# Patient Record
Sex: Male | Born: 1945
Health system: Southern US, Community
[De-identification: ages and names within clinical notes are randomized; demographics above are authoritative.]

## PROBLEM LIST (undated history)

## (undated) DIAGNOSIS — K219 Gastro-esophageal reflux disease without esophagitis: Secondary | ICD-10-CM

## (undated) DIAGNOSIS — I10 Essential (primary) hypertension: Secondary | ICD-10-CM

## (undated) DIAGNOSIS — Z8619 Personal history of other infectious and parasitic diseases: Secondary | ICD-10-CM

## (undated) DIAGNOSIS — M109 Gout, unspecified: Secondary | ICD-10-CM

## (undated) DIAGNOSIS — E785 Hyperlipidemia, unspecified: Secondary | ICD-10-CM

## (undated) DIAGNOSIS — E119 Type 2 diabetes mellitus without complications: Secondary | ICD-10-CM

## (undated) DIAGNOSIS — H269 Unspecified cataract: Secondary | ICD-10-CM

## (undated) HISTORY — DX: Gastro-esophageal reflux disease without esophagitis: K21.9

## (undated) HISTORY — PX: TONSILLECTOMY: SUR1361

## (undated) HISTORY — DX: Essential (primary) hypertension: I10

## (undated) HISTORY — DX: Personal history of other infectious and parasitic diseases: Z86.19

## (undated) HISTORY — DX: Unspecified cataract: H26.9

## (undated) HISTORY — DX: Gout, unspecified: M10.9

## (undated) HISTORY — DX: Type 2 diabetes mellitus without complications: E11.9

## (undated) HISTORY — DX: Hyperlipidemia, unspecified: E78.5

---

## 2006-03-24 ENCOUNTER — Ambulatory Visit: Payer: Self-pay | Admitting: General Surgery

## 2007-02-01 DIAGNOSIS — I10 Essential (primary) hypertension: Secondary | ICD-10-CM | POA: Insufficient documentation

## 2007-05-24 HISTORY — PX: COLONOSCOPY: SHX174

## 2007-05-24 HISTORY — PX: UPPER GI ENDOSCOPY: SHX6162

## 2008-02-04 DIAGNOSIS — E1169 Type 2 diabetes mellitus with other specified complication: Secondary | ICD-10-CM | POA: Insufficient documentation

## 2008-02-26 ENCOUNTER — Ambulatory Visit: Payer: Self-pay | Admitting: General Surgery

## 2008-02-26 LAB — HM COLONOSCOPY

## 2011-03-04 ENCOUNTER — Ambulatory Visit: Payer: Self-pay | Admitting: General Surgery

## 2011-03-04 HISTORY — PX: UPPER GI ENDOSCOPY: SHX6162

## 2012-01-13 DIAGNOSIS — D485 Neoplasm of uncertain behavior of skin: Secondary | ICD-10-CM | POA: Diagnosis not present

## 2012-01-13 DIAGNOSIS — L820 Inflamed seborrheic keratosis: Secondary | ICD-10-CM | POA: Diagnosis not present

## 2012-01-13 DIAGNOSIS — L821 Other seborrheic keratosis: Secondary | ICD-10-CM | POA: Diagnosis not present

## 2012-02-14 DIAGNOSIS — J069 Acute upper respiratory infection, unspecified: Secondary | ICD-10-CM | POA: Diagnosis not present

## 2012-02-14 DIAGNOSIS — H903 Sensorineural hearing loss, bilateral: Secondary | ICD-10-CM | POA: Diagnosis not present

## 2012-02-14 DIAGNOSIS — J301 Allergic rhinitis due to pollen: Secondary | ICD-10-CM | POA: Diagnosis not present

## 2012-02-21 DIAGNOSIS — Z0000 Encounter for general adult medical examination without abnormal findings: Secondary | ICD-10-CM | POA: Diagnosis not present

## 2012-02-21 DIAGNOSIS — Z125 Encounter for screening for malignant neoplasm of prostate: Secondary | ICD-10-CM | POA: Diagnosis not present

## 2012-02-21 DIAGNOSIS — I10 Essential (primary) hypertension: Secondary | ICD-10-CM | POA: Diagnosis not present

## 2012-02-21 DIAGNOSIS — Z1159 Encounter for screening for other viral diseases: Secondary | ICD-10-CM | POA: Diagnosis not present

## 2012-02-21 DIAGNOSIS — K219 Gastro-esophageal reflux disease without esophagitis: Secondary | ICD-10-CM | POA: Diagnosis not present

## 2012-02-21 DIAGNOSIS — R7309 Other abnormal glucose: Secondary | ICD-10-CM | POA: Diagnosis not present

## 2012-02-22 DIAGNOSIS — H908 Mixed conductive and sensorineural hearing loss, unspecified: Secondary | ICD-10-CM | POA: Diagnosis not present

## 2012-02-22 DIAGNOSIS — H8090 Unspecified otosclerosis, unspecified ear: Secondary | ICD-10-CM | POA: Diagnosis not present

## 2012-02-23 DIAGNOSIS — H9319 Tinnitus, unspecified ear: Secondary | ICD-10-CM | POA: Diagnosis not present

## 2012-02-23 DIAGNOSIS — H908 Mixed conductive and sensorineural hearing loss, unspecified: Secondary | ICD-10-CM | POA: Diagnosis not present

## 2012-03-08 DIAGNOSIS — R972 Elevated prostate specific antigen [PSA]: Secondary | ICD-10-CM | POA: Diagnosis not present

## 2012-03-08 DIAGNOSIS — H8090 Unspecified otosclerosis, unspecified ear: Secondary | ICD-10-CM | POA: Diagnosis not present

## 2012-03-08 DIAGNOSIS — H908 Mixed conductive and sensorineural hearing loss, unspecified: Secondary | ICD-10-CM | POA: Diagnosis not present

## 2012-03-13 ENCOUNTER — Ambulatory Visit: Payer: Self-pay | Admitting: Unknown Physician Specialty

## 2012-03-20 ENCOUNTER — Ambulatory Visit: Payer: Self-pay | Admitting: Unknown Physician Specialty

## 2012-03-20 DIAGNOSIS — Z7982 Long term (current) use of aspirin: Secondary | ICD-10-CM | POA: Diagnosis not present

## 2012-03-20 DIAGNOSIS — M109 Gout, unspecified: Secondary | ICD-10-CM | POA: Diagnosis not present

## 2012-03-20 DIAGNOSIS — H908 Mixed conductive and sensorineural hearing loss, unspecified: Secondary | ICD-10-CM | POA: Diagnosis not present

## 2012-03-20 DIAGNOSIS — Z79899 Other long term (current) drug therapy: Secondary | ICD-10-CM | POA: Diagnosis not present

## 2012-03-20 DIAGNOSIS — R972 Elevated prostate specific antigen [PSA]: Secondary | ICD-10-CM | POA: Diagnosis not present

## 2012-03-20 DIAGNOSIS — I10 Essential (primary) hypertension: Secondary | ICD-10-CM | POA: Diagnosis not present

## 2012-03-20 DIAGNOSIS — H902 Conductive hearing loss, unspecified: Secondary | ICD-10-CM | POA: Diagnosis not present

## 2012-03-20 DIAGNOSIS — H8090 Unspecified otosclerosis, unspecified ear: Secondary | ICD-10-CM | POA: Diagnosis not present

## 2012-03-20 DIAGNOSIS — H7419 Adhesive middle ear disease, unspecified ear: Secondary | ICD-10-CM | POA: Diagnosis not present

## 2012-03-20 DIAGNOSIS — K219 Gastro-esophageal reflux disease without esophagitis: Secondary | ICD-10-CM | POA: Diagnosis not present

## 2012-03-20 DIAGNOSIS — Z87891 Personal history of nicotine dependence: Secondary | ICD-10-CM | POA: Diagnosis not present

## 2012-03-20 DIAGNOSIS — K449 Diaphragmatic hernia without obstruction or gangrene: Secondary | ICD-10-CM | POA: Diagnosis not present

## 2012-03-20 DIAGNOSIS — Z5309 Procedure and treatment not carried out because of other contraindication: Secondary | ICD-10-CM | POA: Diagnosis not present

## 2012-03-20 DIAGNOSIS — Q782 Osteopetrosis: Secondary | ICD-10-CM | POA: Diagnosis not present

## 2012-03-20 DIAGNOSIS — N419 Inflammatory disease of prostate, unspecified: Secondary | ICD-10-CM | POA: Diagnosis not present

## 2012-03-27 DIAGNOSIS — Z0000 Encounter for general adult medical examination without abnormal findings: Secondary | ICD-10-CM | POA: Diagnosis not present

## 2012-03-27 DIAGNOSIS — Z23 Encounter for immunization: Secondary | ICD-10-CM | POA: Diagnosis not present

## 2012-03-27 DIAGNOSIS — K219 Gastro-esophageal reflux disease without esophagitis: Secondary | ICD-10-CM | POA: Diagnosis not present

## 2012-03-27 DIAGNOSIS — H908 Mixed conductive and sensorineural hearing loss, unspecified: Secondary | ICD-10-CM | POA: Diagnosis not present

## 2012-05-01 DIAGNOSIS — R972 Elevated prostate specific antigen [PSA]: Secondary | ICD-10-CM | POA: Diagnosis not present

## 2012-06-04 DIAGNOSIS — N411 Chronic prostatitis: Secondary | ICD-10-CM | POA: Insufficient documentation

## 2012-06-04 DIAGNOSIS — R972 Elevated prostate specific antigen [PSA]: Secondary | ICD-10-CM | POA: Diagnosis not present

## 2012-06-04 DIAGNOSIS — N403 Nodular prostate with lower urinary tract symptoms: Secondary | ICD-10-CM | POA: Diagnosis not present

## 2012-06-04 DIAGNOSIS — R339 Retention of urine, unspecified: Secondary | ICD-10-CM | POA: Diagnosis not present

## 2012-06-07 DIAGNOSIS — H35039 Hypertensive retinopathy, unspecified eye: Secondary | ICD-10-CM | POA: Diagnosis not present

## 2012-06-18 DIAGNOSIS — R972 Elevated prostate specific antigen [PSA]: Secondary | ICD-10-CM | POA: Diagnosis not present

## 2012-06-21 DIAGNOSIS — J019 Acute sinusitis, unspecified: Secondary | ICD-10-CM | POA: Diagnosis not present

## 2012-06-21 DIAGNOSIS — Z125 Encounter for screening for malignant neoplasm of prostate: Secondary | ICD-10-CM | POA: Diagnosis not present

## 2012-06-21 DIAGNOSIS — R7309 Other abnormal glucose: Secondary | ICD-10-CM | POA: Diagnosis not present

## 2012-06-21 DIAGNOSIS — K219 Gastro-esophageal reflux disease without esophagitis: Secondary | ICD-10-CM | POA: Diagnosis not present

## 2012-06-27 DIAGNOSIS — Z125 Encounter for screening for malignant neoplasm of prostate: Secondary | ICD-10-CM | POA: Diagnosis not present

## 2012-06-27 DIAGNOSIS — K219 Gastro-esophageal reflux disease without esophagitis: Secondary | ICD-10-CM | POA: Diagnosis not present

## 2012-06-27 DIAGNOSIS — J019 Acute sinusitis, unspecified: Secondary | ICD-10-CM | POA: Diagnosis not present

## 2012-06-27 DIAGNOSIS — R7309 Other abnormal glucose: Secondary | ICD-10-CM | POA: Diagnosis not present

## 2012-08-29 DIAGNOSIS — R972 Elevated prostate specific antigen [PSA]: Secondary | ICD-10-CM | POA: Diagnosis not present

## 2012-09-04 DIAGNOSIS — R972 Elevated prostate specific antigen [PSA]: Secondary | ICD-10-CM | POA: Diagnosis not present

## 2012-09-04 DIAGNOSIS — N401 Enlarged prostate with lower urinary tract symptoms: Secondary | ICD-10-CM | POA: Diagnosis not present

## 2012-09-04 DIAGNOSIS — N138 Other obstructive and reflux uropathy: Secondary | ICD-10-CM | POA: Insufficient documentation

## 2012-11-06 DIAGNOSIS — R972 Elevated prostate specific antigen [PSA]: Secondary | ICD-10-CM | POA: Diagnosis not present

## 2012-11-19 DIAGNOSIS — N411 Chronic prostatitis: Secondary | ICD-10-CM | POA: Diagnosis not present

## 2012-11-19 DIAGNOSIS — R972 Elevated prostate specific antigen [PSA]: Secondary | ICD-10-CM | POA: Diagnosis not present

## 2012-11-19 DIAGNOSIS — N401 Enlarged prostate with lower urinary tract symptoms: Secondary | ICD-10-CM | POA: Diagnosis not present

## 2012-12-06 ENCOUNTER — Encounter: Payer: Self-pay | Admitting: *Deleted

## 2013-01-18 DIAGNOSIS — L821 Other seborrheic keratosis: Secondary | ICD-10-CM | POA: Diagnosis not present

## 2013-01-18 DIAGNOSIS — D235 Other benign neoplasm of skin of trunk: Secondary | ICD-10-CM | POA: Diagnosis not present

## 2013-02-25 DIAGNOSIS — N410 Acute prostatitis: Secondary | ICD-10-CM | POA: Diagnosis not present

## 2013-02-25 DIAGNOSIS — R972 Elevated prostate specific antigen [PSA]: Secondary | ICD-10-CM | POA: Diagnosis not present

## 2013-02-25 DIAGNOSIS — R7309 Other abnormal glucose: Secondary | ICD-10-CM | POA: Diagnosis not present

## 2013-02-25 DIAGNOSIS — Z0000 Encounter for general adult medical examination without abnormal findings: Secondary | ICD-10-CM | POA: Diagnosis not present

## 2013-02-25 DIAGNOSIS — Z23 Encounter for immunization: Secondary | ICD-10-CM | POA: Diagnosis not present

## 2013-02-25 DIAGNOSIS — K219 Gastro-esophageal reflux disease without esophagitis: Secondary | ICD-10-CM | POA: Diagnosis not present

## 2013-02-25 DIAGNOSIS — I10 Essential (primary) hypertension: Secondary | ICD-10-CM | POA: Diagnosis not present

## 2013-04-16 DIAGNOSIS — M546 Pain in thoracic spine: Secondary | ICD-10-CM | POA: Diagnosis not present

## 2013-04-16 DIAGNOSIS — M542 Cervicalgia: Secondary | ICD-10-CM | POA: Diagnosis not present

## 2013-04-16 DIAGNOSIS — M9981 Other biomechanical lesions of cervical region: Secondary | ICD-10-CM | POA: Diagnosis not present

## 2013-04-16 DIAGNOSIS — M545 Low back pain: Secondary | ICD-10-CM | POA: Diagnosis not present

## 2013-04-16 DIAGNOSIS — M999 Biomechanical lesion, unspecified: Secondary | ICD-10-CM | POA: Diagnosis not present

## 2013-05-13 DIAGNOSIS — R972 Elevated prostate specific antigen [PSA]: Secondary | ICD-10-CM | POA: Diagnosis not present

## 2013-05-13 DIAGNOSIS — N411 Chronic prostatitis: Secondary | ICD-10-CM | POA: Diagnosis not present

## 2013-05-13 DIAGNOSIS — N401 Enlarged prostate with lower urinary tract symptoms: Secondary | ICD-10-CM | POA: Diagnosis not present

## 2013-05-21 DIAGNOSIS — M542 Cervicalgia: Secondary | ICD-10-CM | POA: Diagnosis not present

## 2013-05-21 DIAGNOSIS — M999 Biomechanical lesion, unspecified: Secondary | ICD-10-CM | POA: Diagnosis not present

## 2013-05-21 DIAGNOSIS — M546 Pain in thoracic spine: Secondary | ICD-10-CM | POA: Diagnosis not present

## 2013-05-21 DIAGNOSIS — M9981 Other biomechanical lesions of cervical region: Secondary | ICD-10-CM | POA: Diagnosis not present

## 2013-05-21 DIAGNOSIS — M545 Low back pain: Secondary | ICD-10-CM | POA: Diagnosis not present

## 2013-06-03 DIAGNOSIS — E119 Type 2 diabetes mellitus without complications: Secondary | ICD-10-CM | POA: Diagnosis not present

## 2013-06-03 DIAGNOSIS — E785 Hyperlipidemia, unspecified: Secondary | ICD-10-CM | POA: Diagnosis not present

## 2013-06-03 DIAGNOSIS — R109 Unspecified abdominal pain: Secondary | ICD-10-CM | POA: Diagnosis not present

## 2013-06-03 DIAGNOSIS — Z23 Encounter for immunization: Secondary | ICD-10-CM | POA: Diagnosis not present

## 2013-06-03 DIAGNOSIS — Z0000 Encounter for general adult medical examination without abnormal findings: Secondary | ICD-10-CM | POA: Diagnosis not present

## 2013-06-03 DIAGNOSIS — K219 Gastro-esophageal reflux disease without esophagitis: Secondary | ICD-10-CM | POA: Diagnosis not present

## 2013-06-13 DIAGNOSIS — H35039 Hypertensive retinopathy, unspecified eye: Secondary | ICD-10-CM | POA: Diagnosis not present

## 2013-06-28 DIAGNOSIS — M546 Pain in thoracic spine: Secondary | ICD-10-CM | POA: Diagnosis not present

## 2013-06-28 DIAGNOSIS — M545 Low back pain, unspecified: Secondary | ICD-10-CM | POA: Diagnosis not present

## 2013-06-28 DIAGNOSIS — M9981 Other biomechanical lesions of cervical region: Secondary | ICD-10-CM | POA: Diagnosis not present

## 2013-06-28 DIAGNOSIS — M542 Cervicalgia: Secondary | ICD-10-CM | POA: Diagnosis not present

## 2013-06-28 DIAGNOSIS — M999 Biomechanical lesion, unspecified: Secondary | ICD-10-CM | POA: Diagnosis not present

## 2013-07-30 DIAGNOSIS — M546 Pain in thoracic spine: Secondary | ICD-10-CM | POA: Diagnosis not present

## 2013-07-30 DIAGNOSIS — M999 Biomechanical lesion, unspecified: Secondary | ICD-10-CM | POA: Diagnosis not present

## 2013-07-30 DIAGNOSIS — M545 Low back pain, unspecified: Secondary | ICD-10-CM | POA: Diagnosis not present

## 2013-07-30 DIAGNOSIS — M9981 Other biomechanical lesions of cervical region: Secondary | ICD-10-CM | POA: Diagnosis not present

## 2013-07-30 DIAGNOSIS — M542 Cervicalgia: Secondary | ICD-10-CM | POA: Diagnosis not present

## 2013-09-03 DIAGNOSIS — M545 Low back pain, unspecified: Secondary | ICD-10-CM | POA: Diagnosis not present

## 2013-09-03 DIAGNOSIS — M999 Biomechanical lesion, unspecified: Secondary | ICD-10-CM | POA: Diagnosis not present

## 2013-09-03 DIAGNOSIS — M9981 Other biomechanical lesions of cervical region: Secondary | ICD-10-CM | POA: Diagnosis not present

## 2013-09-03 DIAGNOSIS — M546 Pain in thoracic spine: Secondary | ICD-10-CM | POA: Diagnosis not present

## 2013-09-03 DIAGNOSIS — M542 Cervicalgia: Secondary | ICD-10-CM | POA: Diagnosis not present

## 2013-10-08 DIAGNOSIS — M9981 Other biomechanical lesions of cervical region: Secondary | ICD-10-CM | POA: Diagnosis not present

## 2013-10-08 DIAGNOSIS — M545 Low back pain, unspecified: Secondary | ICD-10-CM | POA: Diagnosis not present

## 2013-10-08 DIAGNOSIS — M542 Cervicalgia: Secondary | ICD-10-CM | POA: Diagnosis not present

## 2013-10-08 DIAGNOSIS — M999 Biomechanical lesion, unspecified: Secondary | ICD-10-CM | POA: Diagnosis not present

## 2013-10-08 DIAGNOSIS — M546 Pain in thoracic spine: Secondary | ICD-10-CM | POA: Diagnosis not present

## 2013-10-29 DIAGNOSIS — M9981 Other biomechanical lesions of cervical region: Secondary | ICD-10-CM | POA: Diagnosis not present

## 2013-10-29 DIAGNOSIS — M545 Low back pain, unspecified: Secondary | ICD-10-CM | POA: Diagnosis not present

## 2013-10-29 DIAGNOSIS — M542 Cervicalgia: Secondary | ICD-10-CM | POA: Diagnosis not present

## 2013-10-29 DIAGNOSIS — M999 Biomechanical lesion, unspecified: Secondary | ICD-10-CM | POA: Diagnosis not present

## 2013-10-29 DIAGNOSIS — M546 Pain in thoracic spine: Secondary | ICD-10-CM | POA: Diagnosis not present

## 2013-11-05 DIAGNOSIS — R972 Elevated prostate specific antigen [PSA]: Secondary | ICD-10-CM | POA: Diagnosis not present

## 2013-11-11 DIAGNOSIS — N401 Enlarged prostate with lower urinary tract symptoms: Secondary | ICD-10-CM | POA: Diagnosis not present

## 2013-11-11 DIAGNOSIS — R972 Elevated prostate specific antigen [PSA]: Secondary | ICD-10-CM | POA: Diagnosis not present

## 2013-11-11 DIAGNOSIS — N411 Chronic prostatitis: Secondary | ICD-10-CM | POA: Diagnosis not present

## 2013-11-14 DIAGNOSIS — Z23 Encounter for immunization: Secondary | ICD-10-CM | POA: Diagnosis not present

## 2013-11-14 DIAGNOSIS — E119 Type 2 diabetes mellitus without complications: Secondary | ICD-10-CM | POA: Diagnosis not present

## 2013-11-14 DIAGNOSIS — K219 Gastro-esophageal reflux disease without esophagitis: Secondary | ICD-10-CM | POA: Diagnosis not present

## 2013-11-14 DIAGNOSIS — I10 Essential (primary) hypertension: Secondary | ICD-10-CM | POA: Diagnosis not present

## 2013-11-14 DIAGNOSIS — E785 Hyperlipidemia, unspecified: Secondary | ICD-10-CM | POA: Diagnosis not present

## 2013-12-17 DIAGNOSIS — M542 Cervicalgia: Secondary | ICD-10-CM | POA: Diagnosis not present

## 2013-12-17 DIAGNOSIS — M545 Low back pain, unspecified: Secondary | ICD-10-CM | POA: Diagnosis not present

## 2013-12-17 DIAGNOSIS — M9981 Other biomechanical lesions of cervical region: Secondary | ICD-10-CM | POA: Diagnosis not present

## 2013-12-17 DIAGNOSIS — M546 Pain in thoracic spine: Secondary | ICD-10-CM | POA: Diagnosis not present

## 2013-12-17 DIAGNOSIS — M999 Biomechanical lesion, unspecified: Secondary | ICD-10-CM | POA: Diagnosis not present

## 2014-01-17 DIAGNOSIS — D1801 Hemangioma of skin and subcutaneous tissue: Secondary | ICD-10-CM | POA: Diagnosis not present

## 2014-01-17 DIAGNOSIS — D235 Other benign neoplasm of skin of trunk: Secondary | ICD-10-CM | POA: Diagnosis not present

## 2014-01-17 DIAGNOSIS — L821 Other seborrheic keratosis: Secondary | ICD-10-CM | POA: Diagnosis not present

## 2014-01-21 DIAGNOSIS — M546 Pain in thoracic spine: Secondary | ICD-10-CM | POA: Diagnosis not present

## 2014-01-21 DIAGNOSIS — M545 Low back pain, unspecified: Secondary | ICD-10-CM | POA: Diagnosis not present

## 2014-01-21 DIAGNOSIS — M9981 Other biomechanical lesions of cervical region: Secondary | ICD-10-CM | POA: Diagnosis not present

## 2014-01-21 DIAGNOSIS — M542 Cervicalgia: Secondary | ICD-10-CM | POA: Diagnosis not present

## 2014-01-21 DIAGNOSIS — M999 Biomechanical lesion, unspecified: Secondary | ICD-10-CM | POA: Diagnosis not present

## 2014-02-25 DIAGNOSIS — M9903 Segmental and somatic dysfunction of lumbar region: Secondary | ICD-10-CM | POA: Diagnosis not present

## 2014-02-25 DIAGNOSIS — M9902 Segmental and somatic dysfunction of thoracic region: Secondary | ICD-10-CM | POA: Diagnosis not present

## 2014-02-25 DIAGNOSIS — M542 Cervicalgia: Secondary | ICD-10-CM | POA: Diagnosis not present

## 2014-02-25 DIAGNOSIS — M545 Low back pain: Secondary | ICD-10-CM | POA: Diagnosis not present

## 2014-02-25 DIAGNOSIS — M9901 Segmental and somatic dysfunction of cervical region: Secondary | ICD-10-CM | POA: Diagnosis not present

## 2014-02-25 DIAGNOSIS — M546 Pain in thoracic spine: Secondary | ICD-10-CM | POA: Diagnosis not present

## 2014-02-26 DIAGNOSIS — Z1389 Encounter for screening for other disorder: Secondary | ICD-10-CM | POA: Diagnosis not present

## 2014-02-26 DIAGNOSIS — E119 Type 2 diabetes mellitus without complications: Secondary | ICD-10-CM | POA: Diagnosis not present

## 2014-02-26 DIAGNOSIS — Z23 Encounter for immunization: Secondary | ICD-10-CM | POA: Diagnosis not present

## 2014-02-26 DIAGNOSIS — K219 Gastro-esophageal reflux disease without esophagitis: Secondary | ICD-10-CM | POA: Diagnosis not present

## 2014-02-26 DIAGNOSIS — I10 Essential (primary) hypertension: Secondary | ICD-10-CM | POA: Diagnosis not present

## 2014-02-26 DIAGNOSIS — R972 Elevated prostate specific antigen [PSA]: Secondary | ICD-10-CM | POA: Diagnosis not present

## 2014-02-26 DIAGNOSIS — E785 Hyperlipidemia, unspecified: Secondary | ICD-10-CM | POA: Diagnosis not present

## 2014-02-26 DIAGNOSIS — Z0000 Encounter for general adult medical examination without abnormal findings: Secondary | ICD-10-CM | POA: Diagnosis not present

## 2014-02-26 LAB — CBC AND DIFFERENTIAL
HCT: 47 % (ref 41–53)
HEMOGLOBIN: 15.6 g/dL (ref 13.5–17.5)
PLATELETS: 219 10*3/uL (ref 150–399)
WBC: 7.4 10*3/mL

## 2014-02-26 LAB — BASIC METABOLIC PANEL
BUN: 15 mg/dL (ref 4–21)
Creatinine: 1 mg/dL (ref 0.6–1.3)
GLUCOSE: 128 mg/dL
POTASSIUM: 3.9 mmol/L (ref 3.4–5.3)
Sodium: 141 mmol/L (ref 137–147)

## 2014-02-26 LAB — HEPATIC FUNCTION PANEL
ALT: 16 U/L (ref 10–40)
AST: 17 U/L (ref 14–40)

## 2014-02-26 LAB — LIPID PANEL
CHOLESTEROL: 149 mg/dL (ref 0–200)
HDL: 61 mg/dL (ref 35–70)
LDL CALC: 74 mg/dL
Triglycerides: 72 mg/dL (ref 40–160)

## 2014-02-26 LAB — PSA: PSA: 4.2

## 2014-04-01 DIAGNOSIS — M9901 Segmental and somatic dysfunction of cervical region: Secondary | ICD-10-CM | POA: Diagnosis not present

## 2014-04-01 DIAGNOSIS — M9903 Segmental and somatic dysfunction of lumbar region: Secondary | ICD-10-CM | POA: Diagnosis not present

## 2014-04-01 DIAGNOSIS — M546 Pain in thoracic spine: Secondary | ICD-10-CM | POA: Diagnosis not present

## 2014-04-01 DIAGNOSIS — M542 Cervicalgia: Secondary | ICD-10-CM | POA: Diagnosis not present

## 2014-04-01 DIAGNOSIS — M545 Low back pain: Secondary | ICD-10-CM | POA: Diagnosis not present

## 2014-04-01 DIAGNOSIS — M9902 Segmental and somatic dysfunction of thoracic region: Secondary | ICD-10-CM | POA: Diagnosis not present

## 2014-04-10 DIAGNOSIS — N401 Enlarged prostate with lower urinary tract symptoms: Secondary | ICD-10-CM | POA: Diagnosis not present

## 2014-04-10 DIAGNOSIS — N411 Chronic prostatitis: Secondary | ICD-10-CM | POA: Diagnosis not present

## 2014-04-10 DIAGNOSIS — R339 Retention of urine, unspecified: Secondary | ICD-10-CM | POA: Diagnosis not present

## 2014-04-10 DIAGNOSIS — R972 Elevated prostate specific antigen [PSA]: Secondary | ICD-10-CM | POA: Diagnosis not present

## 2014-04-10 DIAGNOSIS — I10 Essential (primary) hypertension: Secondary | ICD-10-CM | POA: Diagnosis not present

## 2014-05-09 DIAGNOSIS — M9902 Segmental and somatic dysfunction of thoracic region: Secondary | ICD-10-CM | POA: Diagnosis not present

## 2014-05-09 DIAGNOSIS — M546 Pain in thoracic spine: Secondary | ICD-10-CM | POA: Diagnosis not present

## 2014-05-09 DIAGNOSIS — M545 Low back pain: Secondary | ICD-10-CM | POA: Diagnosis not present

## 2014-05-09 DIAGNOSIS — M9903 Segmental and somatic dysfunction of lumbar region: Secondary | ICD-10-CM | POA: Diagnosis not present

## 2014-05-09 DIAGNOSIS — M542 Cervicalgia: Secondary | ICD-10-CM | POA: Diagnosis not present

## 2014-05-09 DIAGNOSIS — M9901 Segmental and somatic dysfunction of cervical region: Secondary | ICD-10-CM | POA: Diagnosis not present

## 2014-05-26 LAB — HM DIABETES EYE EXAM

## 2014-06-19 DIAGNOSIS — H2513 Age-related nuclear cataract, bilateral: Secondary | ICD-10-CM | POA: Diagnosis not present

## 2014-06-19 DIAGNOSIS — H35033 Hypertensive retinopathy, bilateral: Secondary | ICD-10-CM | POA: Diagnosis not present

## 2014-06-20 DIAGNOSIS — M9902 Segmental and somatic dysfunction of thoracic region: Secondary | ICD-10-CM | POA: Diagnosis not present

## 2014-06-20 DIAGNOSIS — M546 Pain in thoracic spine: Secondary | ICD-10-CM | POA: Diagnosis not present

## 2014-06-20 DIAGNOSIS — M542 Cervicalgia: Secondary | ICD-10-CM | POA: Diagnosis not present

## 2014-06-20 DIAGNOSIS — M9903 Segmental and somatic dysfunction of lumbar region: Secondary | ICD-10-CM | POA: Diagnosis not present

## 2014-06-20 DIAGNOSIS — M9901 Segmental and somatic dysfunction of cervical region: Secondary | ICD-10-CM | POA: Diagnosis not present

## 2014-06-20 DIAGNOSIS — M545 Low back pain: Secondary | ICD-10-CM | POA: Diagnosis not present

## 2014-06-30 DIAGNOSIS — E785 Hyperlipidemia, unspecified: Secondary | ICD-10-CM | POA: Diagnosis not present

## 2014-06-30 DIAGNOSIS — I10 Essential (primary) hypertension: Secondary | ICD-10-CM | POA: Diagnosis not present

## 2014-06-30 DIAGNOSIS — E119 Type 2 diabetes mellitus without complications: Secondary | ICD-10-CM | POA: Diagnosis not present

## 2014-06-30 DIAGNOSIS — Z1389 Encounter for screening for other disorder: Secondary | ICD-10-CM | POA: Diagnosis not present

## 2014-06-30 DIAGNOSIS — K219 Gastro-esophageal reflux disease without esophagitis: Secondary | ICD-10-CM | POA: Diagnosis not present

## 2014-06-30 DIAGNOSIS — Z23 Encounter for immunization: Secondary | ICD-10-CM | POA: Diagnosis not present

## 2014-06-30 DIAGNOSIS — J309 Allergic rhinitis, unspecified: Secondary | ICD-10-CM | POA: Diagnosis not present

## 2014-06-30 LAB — HEMOGLOBIN A1C: HEMOGLOBIN A1C: 6.3 % — AB (ref 4.0–6.0)

## 2014-07-16 DIAGNOSIS — R972 Elevated prostate specific antigen [PSA]: Secondary | ICD-10-CM | POA: Diagnosis not present

## 2014-07-16 DIAGNOSIS — Z79899 Other long term (current) drug therapy: Secondary | ICD-10-CM | POA: Diagnosis not present

## 2014-07-16 DIAGNOSIS — I10 Essential (primary) hypertension: Secondary | ICD-10-CM | POA: Diagnosis not present

## 2014-07-16 DIAGNOSIS — N138 Other obstructive and reflux uropathy: Secondary | ICD-10-CM | POA: Diagnosis not present

## 2014-07-16 DIAGNOSIS — N411 Chronic prostatitis: Secondary | ICD-10-CM | POA: Diagnosis not present

## 2014-07-16 DIAGNOSIS — Z7982 Long term (current) use of aspirin: Secondary | ICD-10-CM | POA: Diagnosis not present

## 2014-07-16 DIAGNOSIS — R339 Retention of urine, unspecified: Secondary | ICD-10-CM | POA: Diagnosis not present

## 2014-07-16 DIAGNOSIS — N403 Nodular prostate with lower urinary tract symptoms: Secondary | ICD-10-CM | POA: Diagnosis not present

## 2014-07-28 DIAGNOSIS — M546 Pain in thoracic spine: Secondary | ICD-10-CM | POA: Diagnosis not present

## 2014-07-28 DIAGNOSIS — M9902 Segmental and somatic dysfunction of thoracic region: Secondary | ICD-10-CM | POA: Diagnosis not present

## 2014-07-28 DIAGNOSIS — M542 Cervicalgia: Secondary | ICD-10-CM | POA: Diagnosis not present

## 2014-07-28 DIAGNOSIS — M545 Low back pain: Secondary | ICD-10-CM | POA: Diagnosis not present

## 2014-07-28 DIAGNOSIS — M9901 Segmental and somatic dysfunction of cervical region: Secondary | ICD-10-CM | POA: Diagnosis not present

## 2014-07-28 DIAGNOSIS — M9903 Segmental and somatic dysfunction of lumbar region: Secondary | ICD-10-CM | POA: Diagnosis not present

## 2014-08-13 DIAGNOSIS — M546 Pain in thoracic spine: Secondary | ICD-10-CM | POA: Diagnosis not present

## 2014-08-13 DIAGNOSIS — M9902 Segmental and somatic dysfunction of thoracic region: Secondary | ICD-10-CM | POA: Diagnosis not present

## 2014-08-13 DIAGNOSIS — M9903 Segmental and somatic dysfunction of lumbar region: Secondary | ICD-10-CM | POA: Diagnosis not present

## 2014-08-13 DIAGNOSIS — M9901 Segmental and somatic dysfunction of cervical region: Secondary | ICD-10-CM | POA: Diagnosis not present

## 2014-08-13 DIAGNOSIS — M545 Low back pain: Secondary | ICD-10-CM | POA: Diagnosis not present

## 2014-08-13 DIAGNOSIS — M542 Cervicalgia: Secondary | ICD-10-CM | POA: Diagnosis not present

## 2014-09-09 NOTE — Op Note (Signed)
PATIENT NAME:  Adrian Thompson, Adrian Thompson MR#:  [id number] DATE OF BIRTH:  13-Mar-1946  DATE OF PROCEDURE:  03/20/2012  PREOPERATIVE DIAGNOSIS: Conductive hearing loss.   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   OPERATION PERFORMED:  1. Middle ear exploration. 2. Attempted stapedectomy.   SURGEON: Roena Malady, MD   OPERATIVE FINDINGS: There was significant fibrous tissue throughout the middle ear space. There was fibrous tissue surrounding the anterior and posterior crura of the stapes. The footplate of the stapes could not be visualized.   DESCRIPTION OF PROCEDURE: Adrian Thompson was identified in the holding area and taken to the operating room and placed in the supine position. After general endotracheal anesthesia, the table was turned 90 degrees. The left ear was prepped and draped sterilely. A local anesthetic of 1% lidocaine with 1:100,000 units of epinephrine was used to inject the tragus and the ear canal. A total of 1.5 mL was used. With the ear prepped and draped sterilely, the operating microscope was brought onto the field. Examination of the ear canal showed normal anatomy. A tympanomeatal incision was made from 6 to 12 o'clock and a tympanomeatal flap was elevated down to the annulus. The annulus was lifted anteriorly and the middle ear space was entered. A cotton ball with adrenaline was then placed in the ear canal. The stapes holder was in place and affixed to the speculum. The cotton ball with adrenaline was removed. The middle ear space was easily visualized. The lenticular process was surrounded by fibrous web obscuring the stapes. This was gently peeled off the lenticular process where the capitulum of the stapes could be visualized. Due to an overhanging scutum, this was taken down gently using a bone curette. The chorda tympani nerve was identified and preserved throughout the case. With the chorda gently medialized, a large portion of the  scutum was removed to allow visualization of the superior portion of the footplate. However, once this was removed the footplate could not be visualized due to the significant fibrosis which surrounded it. I spent significant time trying to clean this fibrosis and scarring from around the stapes and despite multiple attempts at trying to clean this I was unable to do so. I was able to clean off the stapedial tendon and visualize this as well as the posterior crura but the anterior crura and the footplate I could not visualize due to the significant soft tissue fibrosis. I did palpate the stapes and it did appear to be fixed, however, I did not feel it would be safe to proceed with stapedectomy when I could not visualize the footplate. Therefore, the operation was aborted. The tympanomeatal flap was laid back into position. Gelfoam impregnated with bacitracin ointment was then placed against the tympanomeatal incision. The patient was then returned to anesthesia where he was awakened in the operating room and taken to the recovery room in stable condition.   CULTURES: None.   SPECIMENS: None.   ESTIMATED BLOOD LOSS: Less than 5 mL.   ____________________________ Roena Malady, MD ctm:drc D: 03/20/2012 11:14:36 ET T: 03/20/2012 11:45:09 ET JOB#: [id number]  cc: Roena Malady, MD, <Dictator> Roena Malady MD ELECTRONICALLY SIGNED 04/03/2012 17:26

## 2014-09-16 DIAGNOSIS — M542 Cervicalgia: Secondary | ICD-10-CM | POA: Diagnosis not present

## 2014-09-16 DIAGNOSIS — M546 Pain in thoracic spine: Secondary | ICD-10-CM | POA: Diagnosis not present

## 2014-09-16 DIAGNOSIS — M9901 Segmental and somatic dysfunction of cervical region: Secondary | ICD-10-CM | POA: Diagnosis not present

## 2014-09-16 DIAGNOSIS — M9902 Segmental and somatic dysfunction of thoracic region: Secondary | ICD-10-CM | POA: Diagnosis not present

## 2014-09-16 DIAGNOSIS — M545 Low back pain: Secondary | ICD-10-CM | POA: Diagnosis not present

## 2014-09-16 DIAGNOSIS — M9903 Segmental and somatic dysfunction of lumbar region: Secondary | ICD-10-CM | POA: Diagnosis not present

## 2014-10-21 DIAGNOSIS — M542 Cervicalgia: Secondary | ICD-10-CM | POA: Diagnosis not present

## 2014-10-21 DIAGNOSIS — M546 Pain in thoracic spine: Secondary | ICD-10-CM | POA: Diagnosis not present

## 2014-10-21 DIAGNOSIS — M545 Low back pain: Secondary | ICD-10-CM | POA: Diagnosis not present

## 2014-10-21 DIAGNOSIS — M9902 Segmental and somatic dysfunction of thoracic region: Secondary | ICD-10-CM | POA: Diagnosis not present

## 2014-10-21 DIAGNOSIS — M9901 Segmental and somatic dysfunction of cervical region: Secondary | ICD-10-CM | POA: Diagnosis not present

## 2014-10-21 DIAGNOSIS — M9903 Segmental and somatic dysfunction of lumbar region: Secondary | ICD-10-CM | POA: Diagnosis not present

## 2014-12-01 DIAGNOSIS — M542 Cervicalgia: Secondary | ICD-10-CM | POA: Diagnosis not present

## 2014-12-01 DIAGNOSIS — M9902 Segmental and somatic dysfunction of thoracic region: Secondary | ICD-10-CM | POA: Diagnosis not present

## 2014-12-01 DIAGNOSIS — M545 Low back pain: Secondary | ICD-10-CM | POA: Diagnosis not present

## 2014-12-01 DIAGNOSIS — M9903 Segmental and somatic dysfunction of lumbar region: Secondary | ICD-10-CM | POA: Diagnosis not present

## 2014-12-01 DIAGNOSIS — M546 Pain in thoracic spine: Secondary | ICD-10-CM | POA: Diagnosis not present

## 2014-12-01 DIAGNOSIS — M9901 Segmental and somatic dysfunction of cervical region: Secondary | ICD-10-CM | POA: Diagnosis not present

## 2014-12-12 DIAGNOSIS — M9902 Segmental and somatic dysfunction of thoracic region: Secondary | ICD-10-CM | POA: Diagnosis not present

## 2014-12-12 DIAGNOSIS — M545 Low back pain: Secondary | ICD-10-CM | POA: Diagnosis not present

## 2014-12-12 DIAGNOSIS — M546 Pain in thoracic spine: Secondary | ICD-10-CM | POA: Diagnosis not present

## 2014-12-12 DIAGNOSIS — M9903 Segmental and somatic dysfunction of lumbar region: Secondary | ICD-10-CM | POA: Diagnosis not present

## 2014-12-12 DIAGNOSIS — M9901 Segmental and somatic dysfunction of cervical region: Secondary | ICD-10-CM | POA: Diagnosis not present

## 2014-12-12 DIAGNOSIS — M542 Cervicalgia: Secondary | ICD-10-CM | POA: Diagnosis not present

## 2015-01-06 DIAGNOSIS — M9901 Segmental and somatic dysfunction of cervical region: Secondary | ICD-10-CM | POA: Diagnosis not present

## 2015-01-06 DIAGNOSIS — M9902 Segmental and somatic dysfunction of thoracic region: Secondary | ICD-10-CM | POA: Diagnosis not present

## 2015-01-06 DIAGNOSIS — M542 Cervicalgia: Secondary | ICD-10-CM | POA: Diagnosis not present

## 2015-01-06 DIAGNOSIS — M546 Pain in thoracic spine: Secondary | ICD-10-CM | POA: Diagnosis not present

## 2015-01-06 DIAGNOSIS — M9903 Segmental and somatic dysfunction of lumbar region: Secondary | ICD-10-CM | POA: Diagnosis not present

## 2015-01-06 DIAGNOSIS — M545 Low back pain: Secondary | ICD-10-CM | POA: Diagnosis not present

## 2015-01-14 DIAGNOSIS — N138 Other obstructive and reflux uropathy: Secondary | ICD-10-CM | POA: Diagnosis not present

## 2015-01-14 DIAGNOSIS — R339 Retention of urine, unspecified: Secondary | ICD-10-CM | POA: Diagnosis not present

## 2015-01-14 DIAGNOSIS — N411 Chronic prostatitis: Secondary | ICD-10-CM | POA: Diagnosis not present

## 2015-01-14 DIAGNOSIS — Z6830 Body mass index (BMI) 30.0-30.9, adult: Secondary | ICD-10-CM | POA: Diagnosis not present

## 2015-01-14 DIAGNOSIS — R972 Elevated prostate specific antigen [PSA]: Secondary | ICD-10-CM | POA: Diagnosis not present

## 2015-01-14 DIAGNOSIS — N403 Nodular prostate with lower urinary tract symptoms: Secondary | ICD-10-CM | POA: Diagnosis not present

## 2015-01-16 DIAGNOSIS — X32XXXA Exposure to sunlight, initial encounter: Secondary | ICD-10-CM | POA: Diagnosis not present

## 2015-01-16 DIAGNOSIS — L570 Actinic keratosis: Secondary | ICD-10-CM | POA: Diagnosis not present

## 2015-01-16 DIAGNOSIS — D2262 Melanocytic nevi of left upper limb, including shoulder: Secondary | ICD-10-CM | POA: Diagnosis not present

## 2015-01-16 DIAGNOSIS — L298 Other pruritus: Secondary | ICD-10-CM | POA: Diagnosis not present

## 2015-01-16 DIAGNOSIS — D2261 Melanocytic nevi of right upper limb, including shoulder: Secondary | ICD-10-CM | POA: Diagnosis not present

## 2015-01-16 DIAGNOSIS — D225 Melanocytic nevi of trunk: Secondary | ICD-10-CM | POA: Diagnosis not present

## 2015-01-16 DIAGNOSIS — L821 Other seborrheic keratosis: Secondary | ICD-10-CM | POA: Diagnosis not present

## 2015-01-16 DIAGNOSIS — L538 Other specified erythematous conditions: Secondary | ICD-10-CM | POA: Diagnosis not present

## 2015-01-16 DIAGNOSIS — L820 Inflamed seborrheic keratosis: Secondary | ICD-10-CM | POA: Diagnosis not present

## 2015-02-10 DIAGNOSIS — M545 Low back pain: Secondary | ICD-10-CM | POA: Diagnosis not present

## 2015-02-10 DIAGNOSIS — M9903 Segmental and somatic dysfunction of lumbar region: Secondary | ICD-10-CM | POA: Diagnosis not present

## 2015-02-10 DIAGNOSIS — M542 Cervicalgia: Secondary | ICD-10-CM | POA: Diagnosis not present

## 2015-02-10 DIAGNOSIS — M9902 Segmental and somatic dysfunction of thoracic region: Secondary | ICD-10-CM | POA: Diagnosis not present

## 2015-02-10 DIAGNOSIS — M546 Pain in thoracic spine: Secondary | ICD-10-CM | POA: Diagnosis not present

## 2015-02-10 DIAGNOSIS — M9901 Segmental and somatic dysfunction of cervical region: Secondary | ICD-10-CM | POA: Diagnosis not present

## 2015-02-25 ENCOUNTER — Other Ambulatory Visit: Payer: Self-pay | Admitting: Family Medicine

## 2015-02-26 DIAGNOSIS — H9190 Unspecified hearing loss, unspecified ear: Secondary | ICD-10-CM | POA: Insufficient documentation

## 2015-02-26 DIAGNOSIS — R972 Elevated prostate specific antigen [PSA]: Secondary | ICD-10-CM | POA: Insufficient documentation

## 2015-02-26 DIAGNOSIS — E669 Obesity, unspecified: Secondary | ICD-10-CM | POA: Insufficient documentation

## 2015-02-26 DIAGNOSIS — K219 Gastro-esophageal reflux disease without esophagitis: Secondary | ICD-10-CM | POA: Insufficient documentation

## 2015-02-26 DIAGNOSIS — E785 Hyperlipidemia, unspecified: Secondary | ICD-10-CM | POA: Insufficient documentation

## 2015-03-02 ENCOUNTER — Encounter: Payer: Self-pay | Admitting: Family Medicine

## 2015-03-02 ENCOUNTER — Ambulatory Visit (INDEPENDENT_AMBULATORY_CARE_PROVIDER_SITE_OTHER): Payer: Medicare Other | Admitting: Family Medicine

## 2015-03-02 VITALS — BP 130/68 | HR 58 | Temp 98.1°F | Resp 16 | Ht 73.0 in | Wt 225.0 lb

## 2015-03-02 DIAGNOSIS — I10 Essential (primary) hypertension: Secondary | ICD-10-CM | POA: Diagnosis not present

## 2015-03-02 DIAGNOSIS — K219 Gastro-esophageal reflux disease without esophagitis: Secondary | ICD-10-CM

## 2015-03-02 DIAGNOSIS — E785 Hyperlipidemia, unspecified: Secondary | ICD-10-CM | POA: Diagnosis not present

## 2015-03-02 DIAGNOSIS — Z23 Encounter for immunization: Secondary | ICD-10-CM

## 2015-03-02 DIAGNOSIS — E119 Type 2 diabetes mellitus without complications: Secondary | ICD-10-CM | POA: Diagnosis not present

## 2015-03-02 DIAGNOSIS — H9113 Presbycusis, bilateral: Secondary | ICD-10-CM | POA: Diagnosis not present

## 2015-03-02 DIAGNOSIS — Z0000 Encounter for general adult medical examination without abnormal findings: Secondary | ICD-10-CM

## 2015-03-02 NOTE — Progress Notes (Signed)
Patient: Adrian Thompson, Male    DOB: 05/25/1945, 69 y.o.   MRN: [id number] Visit Date: 03/02/2015  Today's Provider: Lelon Huh, MD   Chief Complaint  Patient presents with  . Medicare Wellness  . Hypertension  . Diabetes  . Hyperlipidemia  . Allergic Rhinitis   . Gastrophageal Reflux   Subjective:    Annual wellness visit Adrian Thompson is a 69 y.o. male. He feels well. He reports exercising no. He reports he is sleeping fairly well.  -----------------------------------------------------------  Follow-up for allergic rhinitis from 06/30/2014; started montelukast 10 mg x1 qd.   GERD: Follow-up for GERD from 06/30/2014. Doing well with omeprazole with no reflux symptoms so long as he follows diet and takes PPi every day.      Diabetes Mellitus Type II, Follow-up:   Lab Results  Component Value Date   HGBA1C 6.3* 06/30/2014   Last seen for diabetes 8 months ago.  Management since then includes none. He reports good compliance with treatment. He is not having side effects. none Current symptoms include none and have been stable. Home blood sugar records: fasting range: 120  Episodes of hypoglycemia? no   Current Insulin Regimen: none Most Recent Eye Exam: January 2016 Weight trend: stable Prior visit with dietician: no Current diet: in general, an "unhealthy" diet Current exercise: none  ------------------------------------------------------------------------   Hypertension, follow-up:  BP Readings from Last 3 Encounters:  03/02/15 130/68  06/30/14 122/80    He was last seen for hypertension 8 months ago.  BP at that visit was 120/80. Management since that visit includes none .He reports good compliance with treatment. He is not having side effects. none  He is not exercising. He is not adherent to low salt diet.   Outside blood pressures are n/a. He is experiencing none.  Patient denies none.   Cardiovascular risk factors include diabetes  mellitus.  Use of agents associated with hypertension: none.   ------------------------------------------------------------------------    Lipid/Cholesterol, Follow-up:   Last seen for this 8 months ago.  Management since that visit includes none.  Last Lipid Panel:    Component Value Date/Time   CHOL 149 02/26/2014   TRIG 72 02/26/2014   HDL 61 02/26/2014   LDLCALC 74 02/26/2014    He reports good compliance with treatment. He is not having side effects. none  Wt Readings from Last 3 Encounters:  03/02/15 225 lb (102.059 kg)  06/30/14 225 lb (102.059 kg)    ----------------------------------------------------------------------    Review of Systems  Constitutional: Negative.   HENT: Positive for hearing loss.   Eyes: Negative.   Respiratory: Negative.   Cardiovascular: Negative.   Gastrointestinal: Negative.   Endocrine: Negative.   Genitourinary: Positive for frequency.  Musculoskeletal: Positive for back pain.  Skin: Negative.   Allergic/Immunologic: Negative.   Neurological: Negative.   Hematological: Negative.   Psychiatric/Behavioral: Negative.     Social History   Social History  . Marital Status: Married    Spouse Name: N/A  . Number of Children: N/A  . Years of Education: N/A   Occupational History  . Retired    Social History Main Topics  . Smoking status: Never Smoker   . Smokeless tobacco: Former Systems developer    Types: Snuff, Chew  . Alcohol Use: No  . Drug Use: No  . Sexual Activity: Not on file   Other Topics Concern  . Not on file   Social History Narrative    Patient Active Problem  List   Diagnosis Date Noted  . Elevated PSA 02/26/2015  . GERD (gastroesophageal reflux disease) 02/26/2015  . Hearing loss 02/26/2015  . HLD (hyperlipidemia) 02/26/2015  . Obesity 02/26/2015  . Benign prostatic hyperplasia with urinary obstruction 09/04/2012  . Chronic prostatitis 06/04/2012  . Allergic rhinitis 02/08/2009  . Diverticulosis of  colon without hemorrhage 02/26/2008  . Esophagitis, reflux 02/26/2008  . Diabetes mellitus ([place]) 02/04/2008  . Gout 02/01/2007  . Essential (primary) hypertension 02/01/2007  . Irritable bowel syndrome 05/23/1998    Past Surgical History  Procedure Laterality Date  . Tonsillectomy    . Upper gi endoscopy  2009  . Upper gi endoscopy  03/04/2011    [facility]; Excision of multiple gastric polyps    His family history includes Hypertension in his mother and sister.    Previous Medications   ASPIRIN 81 MG TABLET    Take 1 tablet by mouth daily.   ATENOLOL (TENORMIN) 50 MG TABLET    Take 1 tablet by mouth daily.   CETIRIZINE (ZYRTEC) 10 MG TABLET    Take 1 tablet by mouth daily.   FLUTICASONE (FLONASE) 50 MCG/ACT NASAL SPRAY    Place 1-2 sprays into both nostrils daily as needed.   GLUCOSAMINE-CHONDROITIN 500-400 MG TABLET    Take 1 tablet by mouth daily.   GLUCOSE BLOOD (TRUETEST TEST) TEST STRIP    1 strip daily.   INDOMETHACIN (INDOCIN) 25 MG CAPSULE    Take 1 capsule by mouth. Up to 3 times a day   MISC NATURAL PRODUCTS (BLACK CHERRY CONCENTRATE PO)    Take 1 tablet by mouth daily.   MONTELUKAST (SINGULAIR) 10 MG TABLET    Take 1 tablet by mouth daily.   MULTIPLE VITAMINS-MINERALS (MULTIVITAMIN ADULT PO)    Take 1 tablet by mouth daily.   NORTRIPTYLINE (PAMELOR) 25 MG CAPSULE    Take 1 capsule by mouth daily.   OMEGA-3 FATTY ACIDS (FISH OIL) 1000 MG CPDR    Take 1 capsule by mouth daily.   OMEPRAZOLE (PRILOSEC) 20 MG CAPSULE    Take 1 capsule by mouth daily.   PRAVASTATIN (PRAVACHOL) 20 MG TABLET    TAKE 1 TABLET EVERY DAY   PRAVASTATIN (PRAVACHOL) 20 MG TABLET    Take 1 tablet by mouth daily.   TAMSULOSIN (FLOMAX) 0.4 MG CAPS CAPSULE    Take 1 capsule by mouth daily.   TRIAMTERENE-HYDROCHLOROTHIAZIDE (DYAZIDE) 37.5-25 MG CAPSULE    Take 1 capsule by mouth daily.    Patient Care Team: Birdie Sons, MD as PCP - General (Family Medicine) Robert Bellow, MD (General  Surgery) Murrell Redden, MD (Urology)     Objective:   Vitals: BP 130/68 mmHg  Pulse 58  Temp(Src) 98.1 F (36.7 C) (Oral)  Resp 16  Ht 6\' 1"  (1.854 m)  Wt 225 lb (102.059 kg)  BMI 29.69 kg/m2  SpO2 97%  Physical Exam   General Appearance:    Alert, cooperative, no distress, appears stated age  Head:    Normocephalic, without obvious abnormality, atraumatic  Eyes:    PERRL, conjunctiva/corneas clear, EOM's intact, fundi    benign, both eyes       Ears:    Normal TM's and external ear canals, both ears. Wears hearing aides in both ears.   Nose:   Nares normal, septum midline, mucosa normal, no drainage   or sinus tenderness  Throat:   Lips, mucosa, and tongue normal; teeth and gums normal  Neck:   Supple,  symmetrical, trachea midline, no adenopathy;       thyroid:  No enlargement/tenderness/nodules; no carotid   bruit or JVD  Back:     Symmetric, no curvature, ROM normal, no CVA tenderness  Lungs:     Clear to auscultation bilaterally, respirations unlabored  Chest wall:    No tenderness or deformity  Heart:    Regular rate and rhythm, S1 and S2 normal, no murmur, rub   or gallop  Abdomen:     Soft, non-tender, bowel sounds active all four quadrants,    no masses, no organomegaly  Genitalia:    deferred  Rectal:    deferred  Extremities:   Extremities normal, atraumatic, no cyanosis or edema  Pulses:   2+ and symmetric all extremities  Skin:   Skin color, texture, turgor normal, no rashes or lesions  Lymph nodes:   Cervical, supraclavicular, and axillary nodes normal  Neurologic:   CNII-XII intact. Normal strength, sensation and reflexes      throughout     Activities of Daily Living In your present state of health, do you have any difficulty performing the following activities: 03/02/2015  Hearing? Y  Vision? Y  Difficulty concentrating or making decisions? Y  Walking or climbing stairs? Y  Dressing or bathing? N  Doing errands, shopping? N    Fall Risk  Assessment Fall Risk  03/02/2015  Falls in the past year? No     Depression Screen PHQ 2/9 Scores 03/02/2015  PHQ - 2 Score 0  PHQ- 9 Score 1    Cognitive Testing - 6-CIT  Correct? Score   What year is it? yes 0 0 or 4  What month is it? yes 0 0 or 3  Memorize:    Adrian Thompson,  43,  [address]  [place],      What time is it? (within 1 hour) yes 0 0 or 3  Count backwards from 20 yes 0 0, 2, or 4  Name the months of the year yes 0 0, 2, or 4  Repeat name & address above yes 0 0, 2, 4, 6, 8, or 10       TOTAL SCORE  0/28   Interpretation:  Normal  Normal (0-7) Abnormal (8-28)   Audit-C Alcohol Use Screening  Question Answer Points  How often do you have alcoholic drink? never 0  On days you do drink alcohol, how many drinks do you typically consume? never 0  How oftey will you drink 6 or more in a total? never 0  Total Score:  0   A score of 3 or more in women, and 4 or more in men indicates increased risk for alcohol abuse, EXCEPT if all of the points are from question 1.     Assessment & Plan:     Annual Wellness Visit  Reviewed patient's Family Medical History Reviewed and updated list of patient's medical providers Assessment of cognitive impairment was done Assessed patient's functional ability Established a written schedule for health screening [place] Completed and Reviewed  Exercise Activities and Dietary recommendations Goals    None      Immunization History  Administered Date(s) Administered  . Pneumococcal Conjugate-13 02/26/2014  . Pneumococcal Polysaccharide-23 02/25/2013  . Td 03/20/2001  . Tdap 02/14/2011  . Zoster 06/19/2007    Health Maintenance  Topic Date Due  . FOOT EXAM  11/21/1956  . OPHTHALMOLOGY EXAM  11/21/1956  . INFLUENZA VACCINE  12/22/2014  . HEMOGLOBIN A1C  12/29/2014  . URINE MICROALBUMIN  07/01/2015  . COLONOSCOPY  02/25/2018  . TETANUS/TDAP  02/13/2021  . ZOSTAVAX  Completed  . Hepatitis C  Screening  Addressed  . PNA vac Low Risk Adult  Completed      Discussed health benefits of physical activity, and encouraged him to engage in regular exercise appropriate for his age and condition.    ------------------------------------------------------------------------------------------------------------  1. Medicare annual wellness visit, subsequent Generally doing well.   2. Type 2 diabetes mellitus without complication, without long-term current use of insulin (HCC)  - Hemoglobin A1c  3. Essential (primary) hypertension Well controlled.  Continue current medications.   - Renal function panel - EKG 12-Lead  4. Gastroesophageal reflux disease, esophagitis presence not specified Well controlled on PPI  5. HLD (hyperlipidemia) He is tolerating pravastatin well with no adverse effects.   - Lipid panel - Hepatic function panel  6. Need for influenza vaccination  - Flu vaccine HIGH DOSE PF

## 2015-03-03 LAB — HEPATIC FUNCTION PANEL
ALK PHOS: 50 IU/L (ref 39–117)
ALT: 16 IU/L (ref 0–44)
AST: 16 IU/L (ref 0–40)
BILIRUBIN, DIRECT: 0.2 mg/dL (ref 0.00–0.40)
Bilirubin Total: 0.6 mg/dL (ref 0.0–1.2)
Total Protein: 6.5 g/dL (ref 6.0–8.5)

## 2015-03-03 LAB — LIPID PANEL
CHOL/HDL RATIO: 2.6 ratio (ref 0.0–5.0)
Cholesterol, Total: 134 mg/dL (ref 100–199)
HDL: 52 mg/dL (ref >39)
LDL CALC: 64 mg/dL (ref 0–99)
TRIGLYCERIDES: 92 mg/dL (ref 0–149)
VLDL CHOLESTEROL CAL: 18 mg/dL (ref 5–40)

## 2015-03-03 LAB — RENAL FUNCTION PANEL
ALBUMIN: 4.1 g/dL (ref 3.6–4.8)
BUN/Creatinine Ratio: 10 (ref 10–22)
BUN: 9 mg/dL (ref 8–27)
CO2: 28 mmol/L (ref 18–29)
Calcium: 9.5 mg/dL (ref 8.6–10.2)
Chloride: 99 mmol/L (ref 97–108)
Creatinine, Ser: 0.9 mg/dL (ref 0.76–1.27)
GFR, EST AFRICAN AMERICAN: 101 mL/min/{1.73_m2} (ref >59)
GFR, EST NON AFRICAN AMERICAN: 87 mL/min/{1.73_m2} (ref >59)
Glucose: 117 mg/dL — ABNORMAL HIGH (ref 65–99)
Phosphorus: 3.1 mg/dL (ref 2.5–4.5)
Potassium: 4 mmol/L (ref 3.5–5.2)
Sodium: 142 mmol/L (ref 134–144)

## 2015-03-03 LAB — HEMOGLOBIN A1C
Est. average glucose Bld gHb Est-mCnc: 143 mg/dL
Hgb A1c MFr Bld: 6.6 % — ABNORMAL HIGH (ref 4.8–5.6)

## 2015-03-18 DIAGNOSIS — M542 Cervicalgia: Secondary | ICD-10-CM | POA: Diagnosis not present

## 2015-03-18 DIAGNOSIS — M545 Low back pain: Secondary | ICD-10-CM | POA: Diagnosis not present

## 2015-03-18 DIAGNOSIS — M9901 Segmental and somatic dysfunction of cervical region: Secondary | ICD-10-CM | POA: Diagnosis not present

## 2015-03-18 DIAGNOSIS — M9902 Segmental and somatic dysfunction of thoracic region: Secondary | ICD-10-CM | POA: Diagnosis not present

## 2015-03-18 DIAGNOSIS — M546 Pain in thoracic spine: Secondary | ICD-10-CM | POA: Diagnosis not present

## 2015-03-18 DIAGNOSIS — M9903 Segmental and somatic dysfunction of lumbar region: Secondary | ICD-10-CM | POA: Diagnosis not present

## 2015-03-27 DIAGNOSIS — M9902 Segmental and somatic dysfunction of thoracic region: Secondary | ICD-10-CM | POA: Diagnosis not present

## 2015-03-27 DIAGNOSIS — M545 Low back pain: Secondary | ICD-10-CM | POA: Diagnosis not present

## 2015-03-27 DIAGNOSIS — M546 Pain in thoracic spine: Secondary | ICD-10-CM | POA: Diagnosis not present

## 2015-03-27 DIAGNOSIS — M9903 Segmental and somatic dysfunction of lumbar region: Secondary | ICD-10-CM | POA: Diagnosis not present

## 2015-03-27 DIAGNOSIS — M542 Cervicalgia: Secondary | ICD-10-CM | POA: Diagnosis not present

## 2015-03-27 DIAGNOSIS — M9901 Segmental and somatic dysfunction of cervical region: Secondary | ICD-10-CM | POA: Diagnosis not present

## 2015-04-27 DIAGNOSIS — M9903 Segmental and somatic dysfunction of lumbar region: Secondary | ICD-10-CM | POA: Diagnosis not present

## 2015-04-27 DIAGNOSIS — M9901 Segmental and somatic dysfunction of cervical region: Secondary | ICD-10-CM | POA: Diagnosis not present

## 2015-04-27 DIAGNOSIS — M546 Pain in thoracic spine: Secondary | ICD-10-CM | POA: Diagnosis not present

## 2015-04-27 DIAGNOSIS — M542 Cervicalgia: Secondary | ICD-10-CM | POA: Diagnosis not present

## 2015-04-27 DIAGNOSIS — M545 Low back pain: Secondary | ICD-10-CM | POA: Diagnosis not present

## 2015-04-27 DIAGNOSIS — M9902 Segmental and somatic dysfunction of thoracic region: Secondary | ICD-10-CM | POA: Diagnosis not present

## 2015-05-11 ENCOUNTER — Other Ambulatory Visit: Payer: Self-pay | Admitting: Family Medicine

## 2015-06-09 DIAGNOSIS — M546 Pain in thoracic spine: Secondary | ICD-10-CM | POA: Diagnosis not present

## 2015-06-09 DIAGNOSIS — M9902 Segmental and somatic dysfunction of thoracic region: Secondary | ICD-10-CM | POA: Diagnosis not present

## 2015-06-09 DIAGNOSIS — M545 Low back pain: Secondary | ICD-10-CM | POA: Diagnosis not present

## 2015-06-09 DIAGNOSIS — M9903 Segmental and somatic dysfunction of lumbar region: Secondary | ICD-10-CM | POA: Diagnosis not present

## 2015-06-09 DIAGNOSIS — M542 Cervicalgia: Secondary | ICD-10-CM | POA: Diagnosis not present

## 2015-06-09 DIAGNOSIS — M9901 Segmental and somatic dysfunction of cervical region: Secondary | ICD-10-CM | POA: Diagnosis not present

## 2015-06-23 DIAGNOSIS — H35033 Hypertensive retinopathy, bilateral: Secondary | ICD-10-CM | POA: Diagnosis not present

## 2015-06-23 LAB — HM DIABETES EYE EXAM

## 2015-06-24 ENCOUNTER — Encounter: Payer: Self-pay | Admitting: *Deleted

## 2015-07-13 ENCOUNTER — Other Ambulatory Visit: Payer: Self-pay | Admitting: Family Medicine

## 2015-07-21 DIAGNOSIS — R972 Elevated prostate specific antigen [PSA]: Secondary | ICD-10-CM | POA: Diagnosis not present

## 2015-07-21 DIAGNOSIS — R339 Retention of urine, unspecified: Secondary | ICD-10-CM | POA: Diagnosis not present

## 2015-07-21 DIAGNOSIS — N403 Nodular prostate with lower urinary tract symptoms: Secondary | ICD-10-CM | POA: Diagnosis not present

## 2015-07-21 DIAGNOSIS — N138 Other obstructive and reflux uropathy: Secondary | ICD-10-CM | POA: Diagnosis not present

## 2015-07-21 DIAGNOSIS — N411 Chronic prostatitis: Secondary | ICD-10-CM | POA: Diagnosis not present

## 2015-07-21 DIAGNOSIS — Z6830 Body mass index (BMI) 30.0-30.9, adult: Secondary | ICD-10-CM | POA: Diagnosis not present

## 2015-08-31 ENCOUNTER — Ambulatory Visit: Payer: Medicare Other | Admitting: Family Medicine

## 2015-09-08 ENCOUNTER — Ambulatory Visit (INDEPENDENT_AMBULATORY_CARE_PROVIDER_SITE_OTHER): Payer: Medicare Other | Admitting: Family Medicine

## 2015-09-08 ENCOUNTER — Encounter: Payer: Self-pay | Admitting: Family Medicine

## 2015-09-08 VITALS — BP 118/74 | HR 52 | Temp 98.0°F | Resp 16 | Ht 73.0 in | Wt 219.0 lb

## 2015-09-08 DIAGNOSIS — E119 Type 2 diabetes mellitus without complications: Secondary | ICD-10-CM | POA: Diagnosis not present

## 2015-09-08 DIAGNOSIS — I10 Essential (primary) hypertension: Secondary | ICD-10-CM

## 2015-09-08 DIAGNOSIS — J301 Allergic rhinitis due to pollen: Secondary | ICD-10-CM | POA: Diagnosis not present

## 2015-09-08 LAB — POCT GLYCOSYLATED HEMOGLOBIN (HGB A1C)
Est. average glucose Bld gHb Est-mCnc: 134
Hemoglobin A1C: 6.3

## 2015-09-08 NOTE — Progress Notes (Signed)
Patient: Adrian Thompson Male    DOB: 1946-05-05   70 y.o.   MRN: [id number] Visit Date: 09/08/2015  Today's Provider: Lelon Huh, MD   Chief Complaint  Patient presents with  . Follow-up  . Diabetes  . Hypertension  . Gastroesophageal Reflux   Subjective:    HPI   Follow-up for GERD from 03/02/2015; no changes.   Diabetes Mellitus Type II, Follow-up:   Lab Results  Component Value Date   HGBA1C 6.6* 03/02/2015   HGBA1C 6.3* 06/30/2014   Last seen for diabetes 6 months ago.  Management since then includes; no chnages. He reports good compliance with treatment. He is not having side effects. none Current symptoms include none and have been stable. Home blood sugar records: fasting range 110-120  Episodes of hypoglycemia? no   Current Insulin Regimen: none Most Recent Eye Exam: 1/17 Weight trend: stable Prior visit with dietician: no Current diet: well balanced Current exercise: bicycling  ----------------------------------------------------------------------     Hypertension, follow-up:  BP Readings from Last 3 Encounters:  09/08/15 118/74  03/02/15 130/68  06/30/14 122/80    He was last seen for hypertension 6 months ago.  BP at that visit was 130/68. Management since that visit includes; no changes.He reports good compliance with treatment. He is not having side effects. none  He is exercising. He is adherent to low salt diet.   Outside blood pressures are n/a. He is experiencing none.  Patient denies none.   Cardiovascular risk factors include diabetes mellitus.  Use of agents associated with hypertension: none.   ----------------------------------------------------------------------     No Known Allergies Previous Medications   ASPIRIN 81 MG TABLET    Take 1 tablet by mouth daily.   ATENOLOL (TENORMIN) 50 MG TABLET    TAKE 1 TABLET EVERY DAY   GLUCOSAMINE-CHONDROITIN 500-400 MG TABLET    Take 1 tablet by mouth daily.   GLUCOSE  BLOOD (TRUETEST TEST) TEST STRIP    1 strip daily.   INDOMETHACIN (INDOCIN) 25 MG CAPSULE    Take 1 capsule by mouth. Up to 3 times a day   MISC NATURAL PRODUCTS (BLACK CHERRY CONCENTRATE PO)    Take 1 tablet by mouth daily.   MONTELUKAST (SINGULAIR) 10 MG TABLET    TAKE 1 TABLET EVERY DAY   MULTIPLE VITAMINS-MINERALS (MULTIVITAMIN ADULT PO)    Take 1 tablet by mouth daily.   NORTRIPTYLINE (PAMELOR) 25 MG CAPSULE    TAKE 1 CAPSULE EVERY DAY   OMEGA-3 FATTY ACIDS (FISH OIL) 1000 MG CPDR    Take 1 capsule by mouth daily.   OMEPRAZOLE (PRILOSEC) 20 MG CAPSULE    TAKE 1 CAPSULE EVERY DAY   PRAVASTATIN (PRAVACHOL) 20 MG TABLET    TAKE 1 TABLET EVERY DAY   TAMSULOSIN (FLOMAX) 0.4 MG CAPS CAPSULE    Take 1 capsule by mouth daily.   TRIAMCINOLONE ACETONIDE (NASACORT AQ NA)    Place into the nose.   TRIAMTERENE-HYDROCHLOROTHIAZIDE (DYAZIDE) 37.5-25 MG CAPSULE    TAKE 1 CAPSULE EVERY DAY    Review of Systems  Constitutional: Negative for fever, chills and appetite change.  Respiratory: Negative for chest tightness, shortness of breath and wheezing.   Cardiovascular: Negative for chest pain and palpitations.  Gastrointestinal: Negative for nausea, vomiting and abdominal pain.    Social History  Substance Use Topics  . Smoking status: Never Smoker   . Smokeless tobacco: Former Systems developer    Types: Snuff, Chew  . Alcohol  Use: No   Objective:   BP 118/74 mmHg  Pulse 52  Temp(Src) 98 F (36.7 C) (Oral)  Resp 16  Ht 6\' 1"  (1.854 m)  Wt 219 lb (99.338 kg)  BMI 28.90 kg/m2  Physical Exam   General Appearance:    Alert, cooperative, no distress  Eyes:    PERRL, conjunctiva/corneas clear, EOM's intact       Lungs:     Clear to auscultation bilaterally, respirations unlabored  Heart:    Regular rate and rhythm  Neurologic:   Awake, alert, oriented x 3. No apparent focal neurological           defect.         Results for orders placed or performed in visit on 09/08/15  POCT glycosylated  hemoglobin (Hb A1C)  Result Value Ref Range   Hemoglobin A1C 6.3    Est. average glucose Bld gHb Est-mCnc 134        Assessment & Plan:     1. Type 2 diabetes mellitus without complication, without long-term current use of insulin (HCC) Well controlled.  Continue current medications.   - POCT glycosylated hemoglobin (Hb A1C)  2. Essential (primary) hypertension Well controlled.  Continue current medications.    3. Allergic rhinitis due to pollen Discussed continued use of montelukast until the end of the pollen season. His audiologist changes his nose spray to Nasocort AQ. Advised that generic substitute of triamcinolone is available over the counter.   Return in about 6 months (around 03/09/2016).       Lelon Huh, MD  [facility]

## 2015-09-08 NOTE — Patient Instructions (Signed)
You can use generic triamcinolone nasal spray in place of Nasocort AQ.

## 2016-01-15 DIAGNOSIS — D225 Melanocytic nevi of trunk: Secondary | ICD-10-CM | POA: Diagnosis not present

## 2016-01-15 DIAGNOSIS — L821 Other seborrheic keratosis: Secondary | ICD-10-CM | POA: Diagnosis not present

## 2016-01-15 DIAGNOSIS — D2262 Melanocytic nevi of left upper limb, including shoulder: Secondary | ICD-10-CM | POA: Diagnosis not present

## 2016-01-15 DIAGNOSIS — D2271 Melanocytic nevi of right lower limb, including hip: Secondary | ICD-10-CM | POA: Diagnosis not present

## 2016-01-25 ENCOUNTER — Encounter: Payer: Self-pay | Admitting: Family Medicine

## 2016-01-26 MED ORDER — ATENOLOL 50 MG PO TABS: 50.0000 mg | ORAL_TABLET | Freq: Every day | ORAL | 1 refills | Status: DC

## 2016-03-07 ENCOUNTER — Encounter: Payer: Self-pay | Admitting: Family Medicine

## 2016-03-09 ENCOUNTER — Encounter: Payer: Medicare Other | Admitting: Family Medicine

## 2016-03-10 ENCOUNTER — Ambulatory Visit: Payer: Medicare Other

## 2016-03-16 ENCOUNTER — Encounter: Payer: Medicare Other | Admitting: Family Medicine

## 2016-03-23 DIAGNOSIS — Z23 Encounter for immunization: Secondary | ICD-10-CM | POA: Diagnosis not present

## 2016-03-24 DIAGNOSIS — H8083 Other otosclerosis, bilateral: Secondary | ICD-10-CM | POA: Diagnosis not present

## 2016-03-24 DIAGNOSIS — H9190 Unspecified hearing loss, unspecified ear: Secondary | ICD-10-CM | POA: Diagnosis not present

## 2016-03-24 DIAGNOSIS — H8093 Unspecified otosclerosis, bilateral: Secondary | ICD-10-CM | POA: Diagnosis not present

## 2016-03-24 DIAGNOSIS — Z6828 Body mass index (BMI) 28.0-28.9, adult: Secondary | ICD-10-CM | POA: Diagnosis not present

## 2016-03-24 DIAGNOSIS — H903 Sensorineural hearing loss, bilateral: Secondary | ICD-10-CM | POA: Diagnosis not present

## 2016-04-01 ENCOUNTER — Ambulatory Visit (INDEPENDENT_AMBULATORY_CARE_PROVIDER_SITE_OTHER): Payer: Medicare Other | Admitting: Family Medicine

## 2016-04-01 ENCOUNTER — Encounter: Payer: Self-pay | Admitting: Family Medicine

## 2016-04-01 VITALS — BP 136/92 | HR 60 | Temp 97.7°F | Resp 16 | Ht 72.75 in | Wt 218.0 lb

## 2016-04-01 DIAGNOSIS — R809 Proteinuria, unspecified: Secondary | ICD-10-CM

## 2016-04-01 DIAGNOSIS — I10 Essential (primary) hypertension: Secondary | ICD-10-CM

## 2016-04-01 DIAGNOSIS — E785 Hyperlipidemia, unspecified: Secondary | ICD-10-CM | POA: Diagnosis not present

## 2016-04-01 DIAGNOSIS — E119 Type 2 diabetes mellitus without complications: Secondary | ICD-10-CM | POA: Diagnosis not present

## 2016-04-01 DIAGNOSIS — Z0000 Encounter for general adult medical examination without abnormal findings: Secondary | ICD-10-CM | POA: Diagnosis not present

## 2016-04-01 LAB — POCT UA - MICROALBUMIN: MICROALBUMIN (UR) POC: 50 mg/L

## 2016-04-01 NOTE — Progress Notes (Signed)
Patient: Adrian Thompson, Male    DOB: August 22, 1945, 70 y.o.   MRN: [id number] Visit Date: 04/01/2016  Today's Provider: Lelon Huh, MD   Chief Complaint  Patient presents with  . Medicare Wellness  . Hypertension    follow up  . Gastroesophageal Reflux    follow up  . Diabetes    follow up  . Hyperlipidemia    follow up   Subjective:    Annual wellness visit Adrian Thompson is a 70 y.o. male. He feels well. He reports never exercising. He reports he is sleeping fairly well.  -----------------------------------------------------------  Hypertension, follow-up:  BP Readings from Last 3 Encounters:  09/08/15 118/74  03/02/15 130/68  06/30/14 122/80    He was last seen for hypertension 6 months ago.  BP at that visit was 118/74. Management since that visit includes no changes. He reports good compliance with treatment. He is not having side effects.  He is not exercising. He is not adherent to low salt diet.   Outside blood pressures are not being checked. He is experiencing none.  Patient denies chest pain, chest pressure/discomfort, claudication, dyspnea, exertional chest pressure/discomfort, fatigue, irregular heart beat, lower extremity edema, near-syncope, orthopnea, palpitations, paroxysmal nocturnal dyspnea, syncope and tachypnea.   Cardiovascular risk factors include advanced age (older than 72 for men, 44 for women), diabetes mellitus, dyslipidemia, hypertension and male gender.  Use of agents associated with hypertension: NSAIDS.     Weight trend: fluctuating a bit Wt Readings from Last 3 Encounters:  09/08/15 219 lb (99.3 kg)  03/02/15 225 lb (102.1 kg)  06/30/14 225 lb (102.1 kg)    Current diet: in general, an "unhealthy" diet  ------------------------------------------------------------------------  Diabetes Mellitus Type II, Follow-up:   Lab Results  Component Value Date   HGBA1C 6.3 09/08/2015   HGBA1C 6.6 (H) 03/02/2015   HGBA1C 6.3  (A) 06/30/2014    Last seen for diabetes 6 months ago.  Management since then includes no changes. He reports good compliance with treatment. He is not having side effects.  Current symptoms include none and have been stable. Home blood sugar records: fasting range: 122  Episodes of hypoglycemia? no   Current Insulin Regimen: none Most Recent Eye Exam: 05/2015 Weight trend: fluctuating a bit Prior visit with dietician: no Current diet: in general, an "unhealthy" diet Current exercise: none  Pertinent Labs:    Component Value Date/Time   CHOL 134 03/02/2015 1104   TRIG 92 03/02/2015 1104   HDL 52 03/02/2015 1104   LDLCALC 64 03/02/2015 1104   CREATININE 0.90 03/02/2015 1104    Wt Readings from Last 3 Encounters:  09/08/15 219 lb (99.3 kg)  03/02/15 225 lb (102.1 kg)  06/30/14 225 lb (102.1 kg)    ------------------------------------------------------------------------  GERD, Follow up:  The patient was last seen for GERD 1 years ago. Changes made since that visit include none.  He reports good compliance with treatment. He is not having side effects. Marland Kitchen  He IS experiencing none. He is NOT experiencing abdominal bloating, belching or chest pain  ------------------------------------------------------------------------   Lipid/Cholesterol, Follow-up:   Last seen for this1 years ago.  Management changes since that visit include none. . Last Lipid Panel:    Component Value Date/Time   CHOL 134 03/02/2015 1104   TRIG 92 03/02/2015 1104   HDL 52 03/02/2015 1104   CHOLHDL 2.6 03/02/2015 1104   LDLCALC 64 03/02/2015 1104    Risk factors for vascular disease include  diabetes mellitus, hypercholesterolemia and hypertension  He reports good compliance with treatment. He is not having side effects.  Current symptoms include none and have been stable. Weight trend: fluctuating a bit Prior visit with dietician: no Current diet: in general, an "unhealthy"  diet Current exercise: none  Wt Readings from Last 3 Encounters:  09/08/15 219 lb (99.3 kg)  03/02/15 225 lb (102.1 kg)  06/30/14 225 lb (102.1 kg)    -------------------------------------------------------------------  He is doing well with no new complaints today. He is scheduled for cochlear implant at [facility] January 2nd and has pre-op scheduled in December.   Review of Systems  Constitutional: Negative for appetite change, chills, fatigue and fever.  HENT: Positive for hearing loss. Negative for congestion, ear pain, nosebleeds and trouble swallowing.   Eyes: Negative for pain and visual disturbance.  Respiratory: Negative for cough, chest tightness and shortness of breath.   Cardiovascular: Negative for chest pain, palpitations and leg swelling.  Gastrointestinal: Negative for abdominal pain, blood in stool, constipation, diarrhea, nausea and vomiting.  Endocrine: Negative for polydipsia, polyphagia and polyuria.  Genitourinary: Positive for frequency. Negative for dysuria and flank pain.  Musculoskeletal: Positive for back pain. Negative for arthralgias, joint swelling, myalgias and neck stiffness.  Skin: Negative for color change, rash and wound.  Neurological: Negative for dizziness, tremors, seizures, speech difficulty, weakness, light-headedness and headaches.  Psychiatric/Behavioral: Negative for behavioral problems, confusion, decreased concentration, dysphoric mood and sleep disturbance. The patient is not nervous/anxious.   All other systems reviewed and are negative.   Social History   Social History  . Marital status: Married    Spouse name: N/A  . Number of children: 0  . Years of education: N/A   Occupational History  . Retired    Social History Main Topics  . Smoking status: Never Smoker  . Smokeless tobacco: Former Systems developer    Types: Snuff, Chew  . Alcohol use No  . Drug use: No  . Sexual activity: Not on file   Other Topics Concern  . Not on file    Social History Narrative  . No narrative on file    Past Medical History:  Diagnosis Date  . Gout   . History of chicken pox   . History of measles   . History of mumps      Patient Active Problem List   Diagnosis Date Noted  . Presbycusis of both ears 03/02/2015  . Elevated PSA 02/26/2015  . GERD (gastroesophageal reflux disease) 02/26/2015  . Hearing loss 02/26/2015  . HLD (hyperlipidemia) 02/26/2015  . Benign prostatic hyperplasia with urinary obstruction 09/04/2012  . Chronic prostatitis 06/04/2012  . Allergic rhinitis 02/08/2009  . Diverticulosis of colon without hemorrhage 02/26/2008  . Diabetes mellitus ([place]) 02/04/2008  . Gout 02/01/2007  . Essential (primary) hypertension 02/01/2007  . Irritable bowel syndrome 05/23/1998    Past Surgical History:  Procedure Laterality Date  . TONSILLECTOMY    . UPPER GI ENDOSCOPY  2009  . UPPER GI ENDOSCOPY  03/04/2011   [facility]; Excision of multiple gastric polyps    His family history includes Hypertension in his mother and sister.     Current Meds  Medication Sig  . aspirin 81 MG tablet Take 1 tablet by mouth daily.  Marland Kitchen atenolol (TENORMIN) 50 MG tablet Take 1 tablet (50 mg total) by mouth daily.  Marland Kitchen glucosamine-chondroitin 500-400 MG tablet Take 1 tablet by mouth daily.  Marland Kitchen glucose blood (TRUETEST TEST) test strip 1 strip daily.  . indomethacin (  INDOCIN) 25 MG capsule Take 1 capsule by mouth. Up to 3 times a day  . Misc Natural Products (BLACK CHERRY CONCENTRATE PO) Take 1 tablet by mouth daily.  . montelukast (SINGULAIR) 10 MG tablet TAKE 1 TABLET EVERY DAY  . Multiple Vitamins-Minerals (MULTIVITAMIN ADULT PO) Take 1 tablet by mouth daily.  . nortriptyline (PAMELOR) 25 MG capsule TAKE 1 CAPSULE EVERY DAY  . Omega-3 Fatty Acids (FISH OIL) 1000 MG CPDR Take 1 capsule by mouth daily.  Marland Kitchen omeprazole (PRILOSEC) 20 MG capsule TAKE 1 CAPSULE EVERY DAY  . pravastatin (PRAVACHOL) 20 MG tablet TAKE 1 TABLET EVERY DAY  . tamsulosin  (FLOMAX) 0.4 MG CAPS capsule Take 1 capsule by mouth daily.  . Triamcinolone Acetonide (NASACORT AQ NA) Place into the nose.  . triamterene-hydrochlorothiazide (DYAZIDE) 37.5-25 MG capsule TAKE 1 CAPSULE EVERY DAY    Patient Care Team: Birdie Sons, MD as PCP - General (Family Medicine) Robert Bellow, MD (General Surgery) Murrell Redden, MD (Urology) Roanna Raider, MD as Referring Physician (Otolaryngology)     Objective:   Vitals: BP (!) 136/92 (BP Location: Right Arm, Patient Position: Sitting, Cuff Size: Large)   Pulse 60   Temp 97.7 F (36.5 C) (Oral)   Resp 16   Ht 6' 0.75" (1.848 m)   Wt 218 lb (98.9 kg)   BMI 28.96 kg/m   Physical Exam   General Appearance:    Alert, cooperative, no distress  Eyes:    PERRL, conjunctiva/corneas clear, EOM's intact       Lungs:     Clear to auscultation bilaterally, respirations unlabored  Heart:    Regular rate and rhythm  Neurologic:   Awake, alert, oriented x 3. No apparent focal neurological           defect.         Activities of Daily Living In your present state of health, do you have any difficulty performing the following activities: 04/01/2016  Hearing? Y  Vision? N  Difficulty concentrating or making decisions? N  Walking or climbing stairs? N  Dressing or bathing? N  Doing errands, shopping? N  Some recent data might be hidden    Fall Risk Assessment Fall Risk  04/01/2016 03/02/2015  Falls in the past year? No No     Depression Screen PHQ 2/9 Scores 03/02/2015  PHQ - 2 Score 0  PHQ- 9 Score 1    Cognitive Testing - 6-CIT  Correct? Score   What year is it? yes 0 0 or 4  What month is it? yes 0 0 or 3  Memorize:    Pia Mau,  43,  [place],      What time is it? (within 1 hour) yes 0 0 or 3  Count backwards from 20 yes 0 0, 2, or 4  Name the months of the year yes 0 0, 2, or 4  Repeat name & address above no 3 0, 2, 4, 6, 8, or 10       TOTAL SCORE  3/28    Interpretation:  Normal  Normal (0-7) Abnormal (8-28)   Audit-C Alcohol Use Screening  Question Answer Points  How often do you have alcoholic drink? never 0  On days you do drink alcohol, how many drinks do you typically consume? n/a 0  How oftey will you drink 6 or more in a total? never 0  Total Score:  0   A score of 3  or more in women, and 4 or more in men indicates increased risk for alcohol abuse, EXCEPT if all of the points are from question 1.  Current Exercise Habits: The patient does not participate in regular exercise at present Exercise limited by: None identified  Results for orders placed or performed in visit on 04/01/16  POCT UA - Microalbumin  Result Value Ref Range   Microalbumin Ur, POC 50 mg/L   Creatinine, POC n/a mg/dL   Albumin/Creatinine Ratio, Urine, POC n/a      Assessment & Plan:     Annual Wellness Visit  Reviewed patient's Family Medical History Reviewed and updated list of patient's medical providers Assessment of cognitive impairment was done Assessed patient's functional ability Established a written schedule for health screening Icard Completed and Reviewed  Exercise Activities and Dietary recommendations Goals    None      Immunization History  Administered Date(s) Administered  . Influenza, High Dose Seasonal PF 03/02/2015, 03/07/2016  . Pneumococcal Conjugate-13 02/26/2014  . Pneumococcal Polysaccharide-23 02/25/2013  . Td 03/20/2001  . Tdap 02/14/2011  . Zoster 06/19/2007    Health Maintenance  Topic Date Due  . URINE MICROALBUMIN  07/01/2015  . FOOT EXAM  03/01/2016  . HEMOGLOBIN A1C  03/09/2016  . OPHTHALMOLOGY EXAM  06/22/2016  . COLONOSCOPY  02/25/2018  . TETANUS/TDAP  02/13/2021  . INFLUENZA VACCINE  Completed  . ZOSTAVAX  Completed  . Hepatitis C Screening  Addressed  . PNA vac Low Risk Adult  Completed     Discussed health benefits of physical activity, and encouraged him to  engage in regular exercise appropriate for his age and condition.    ------------------------------------------------------------------------------------------------------------  1. Medicare annual wellness visit, subsequent Generally doing well with cochlear implant anticipated in about 8 weeks.   2. Essential (primary) hypertension Well controlled.  Continue current medications.   - Renal function panel  3. Type 2 diabetes mellitus without complication, without long-term current use of insulin (HCC)  - Hemoglobin A1c - POCT UA - Microalbumin  4. Hyperlipidemia, unspecified hyperlipidemia type He is tolerating lovastatin well with no adverse effects.   - Lipid panel -Hepatic function panel  5. Microalbuminuria Recheck at next visit. Consider ACEI    Lelon Huh, MD  [facility]

## 2016-04-02 LAB — RENAL FUNCTION PANEL
ALBUMIN: 4.4 g/dL (ref 3.6–4.8)
BUN / CREAT RATIO: 15 (ref 10–24)
BUN: 14 mg/dL (ref 8–27)
CALCIUM: 9.9 mg/dL (ref 8.6–10.2)
CHLORIDE: 98 mmol/L (ref 96–106)
CO2: 28 mmol/L (ref 18–29)
CREATININE: 0.96 mg/dL (ref 0.76–1.27)
GFR calc Af Amer: 93 mL/min/{1.73_m2} (ref >59)
GFR calc non Af Amer: 80 mL/min/{1.73_m2} (ref >59)
Glucose: 123 mg/dL — ABNORMAL HIGH (ref 65–99)
Phosphorus: 3.2 mg/dL (ref 2.5–4.5)
Potassium: 3.8 mmol/L (ref 3.5–5.2)
Sodium: 142 mmol/L (ref 134–144)

## 2016-04-02 LAB — HEPATIC FUNCTION PANEL
ALK PHOS: 55 IU/L (ref 39–117)
ALT: 16 IU/L (ref 0–44)
AST: 16 IU/L (ref 0–40)
Bilirubin Total: 0.6 mg/dL (ref 0.0–1.2)
Bilirubin, Direct: 0.19 mg/dL (ref 0.00–0.40)
Total Protein: 6.8 g/dL (ref 6.0–8.5)

## 2016-04-02 LAB — HEMOGLOBIN A1C
ESTIMATED AVERAGE GLUCOSE: 140 mg/dL
HEMOGLOBIN A1C: 6.5 % — AB (ref 4.8–5.6)

## 2016-04-02 LAB — LIPID PANEL
CHOLESTEROL TOTAL: 150 mg/dL (ref 100–199)
Chol/HDL Ratio: 2.6 ratio units (ref 0.0–5.0)
HDL: 57 mg/dL (ref >39)
LDL CALC: 71 mg/dL (ref 0–99)
TRIGLYCERIDES: 109 mg/dL (ref 0–149)
VLDL Cholesterol Cal: 22 mg/dL (ref 5–40)

## 2016-04-04 ENCOUNTER — Telehealth: Payer: Self-pay

## 2016-04-04 NOTE — Telephone Encounter (Signed)
Patient was advised ( she is listed on DPR) appt scheduled for 10/03/16 at [facility].. KW

## 2016-04-04 NOTE — Telephone Encounter (Signed)
-----   Message from Birdie Sons, MD sent at 04/02/2016  6:03 PM EST ----- A1c is good at 6.5 cholesterol is well controlled. Continue current medications.  Follow up 6 months.

## 2016-04-25 ENCOUNTER — Other Ambulatory Visit: Payer: Self-pay | Admitting: Family Medicine

## 2016-05-23 HISTORY — PX: EXTERNAL EAR SURGERY: SHX627

## 2016-06-20 DIAGNOSIS — Z01818 Encounter for other preprocedural examination: Secondary | ICD-10-CM | POA: Diagnosis not present

## 2016-06-20 DIAGNOSIS — R7303 Prediabetes: Secondary | ICD-10-CM | POA: Diagnosis not present

## 2016-06-20 DIAGNOSIS — E118 Type 2 diabetes mellitus with unspecified complications: Secondary | ICD-10-CM | POA: Diagnosis not present

## 2016-06-20 DIAGNOSIS — E785 Hyperlipidemia, unspecified: Secondary | ICD-10-CM | POA: Diagnosis not present

## 2016-06-20 DIAGNOSIS — Z7982 Long term (current) use of aspirin: Secondary | ICD-10-CM | POA: Diagnosis not present

## 2016-06-20 DIAGNOSIS — K219 Gastro-esophageal reflux disease without esophagitis: Secondary | ICD-10-CM | POA: Diagnosis not present

## 2016-06-20 DIAGNOSIS — Z974 Presence of external hearing-aid: Secondary | ICD-10-CM | POA: Diagnosis not present

## 2016-06-20 DIAGNOSIS — R42 Dizziness and giddiness: Secondary | ICD-10-CM | POA: Diagnosis not present

## 2016-06-20 DIAGNOSIS — I10 Essential (primary) hypertension: Secondary | ICD-10-CM | POA: Diagnosis not present

## 2016-06-20 DIAGNOSIS — H906 Mixed conductive and sensorineural hearing loss, bilateral: Secondary | ICD-10-CM | POA: Diagnosis not present

## 2016-06-20 DIAGNOSIS — Z8249 Family history of ischemic heart disease and other diseases of the circulatory system: Secondary | ICD-10-CM | POA: Diagnosis not present

## 2016-06-23 DIAGNOSIS — H35033 Hypertensive retinopathy, bilateral: Secondary | ICD-10-CM | POA: Diagnosis not present

## 2016-06-23 DIAGNOSIS — H524 Presbyopia: Secondary | ICD-10-CM | POA: Diagnosis not present

## 2016-06-23 LAB — HM DIABETES EYE EXAM

## 2016-06-24 ENCOUNTER — Ambulatory Visit (INDEPENDENT_AMBULATORY_CARE_PROVIDER_SITE_OTHER): Payer: Medicare Other | Admitting: Family Medicine

## 2016-06-24 ENCOUNTER — Encounter: Payer: Self-pay | Admitting: Family Medicine

## 2016-06-24 ENCOUNTER — Encounter: Payer: Self-pay | Admitting: *Deleted

## 2016-06-24 ENCOUNTER — Other Ambulatory Visit: Payer: Self-pay | Admitting: Family Medicine

## 2016-06-24 VITALS — BP 164/88 | HR 62 | Temp 97.7°F | Resp 16 | Wt 228.0 lb

## 2016-06-24 DIAGNOSIS — I10 Essential (primary) hypertension: Secondary | ICD-10-CM

## 2016-06-24 MED ORDER — AMLODIPINE BESYLATE 5 MG PO TABS: 5.0000 mg | ORAL_TABLET | Freq: Every day | ORAL | 1 refills | Status: DC

## 2016-06-24 NOTE — Progress Notes (Signed)
Patient: Adrian Thompson Male    DOB: [birth year]   71 y.o.   MRN: [id number] Visit Date: 06/24/2016  Today's Provider: Lelon Huh, MD   Chief Complaint  Patient presents with  . Hypertension   Subjective:    Patient stated that his blood pressure has been elevated since Monday 06/20/2016. Blood pressure has been running around 180/95. Patient is having surgery for his hearing Tuesday 06/28/2016.  His wife states that BP improved when rechecked at pre-op and was cleared for surgery. He states he is compliant with atenolol which he takes every night and Dyazide which he takes every morning.  Patient admits to eating a lot of salty foods.  . BP Readings from Last 3 Encounters:  06/24/16 (!) 164/88  04/01/16 (!) 136/92  09/08/15 118/74     No Known Allergies   Current Outpatient Prescriptions:  .  aspirin 81 MG tablet, Take 1 tablet by mouth daily., Disp: , Rfl:  .  atenolol (TENORMIN) 50 MG tablet, Take 1 tablet (50 mg total) by mouth daily., Disp: 90 tablet, Rfl: 1 .  glucosamine-chondroitin 500-400 MG tablet, Take 1 tablet by mouth daily., Disp: , Rfl:  .  glucose blood (TRUETEST TEST) test strip, 1 strip daily., Disp: , Rfl:  .  indomethacin (INDOCIN) 25 MG capsule, Take 1 capsule by mouth. Up to 3 times a day, Disp: , Rfl:  .  Misc Natural Products (BLACK CHERRY CONCENTRATE PO), Take 1 tablet by mouth daily., Disp: , Rfl:  .  montelukast (SINGULAIR) 10 MG tablet, TAKE 1 TABLET EVERY DAY, Disp: 90 tablet, Rfl: 2 .  Multiple Vitamins-Minerals (MULTIVITAMIN ADULT PO), Take 1 tablet by mouth daily., Disp: , Rfl:  .  nortriptyline (PAMELOR) 25 MG capsule, TAKE 1 CAPSULE EVERY DAY, Disp: 90 capsule, Rfl: 3 .  Omega-3 Fatty Acids (FISH OIL) 1000 MG CPDR, Take 1 capsule by mouth daily., Disp: , Rfl:  .  omeprazole (PRILOSEC) 20 MG capsule, TAKE 1 CAPSULE EVERY DAY, Disp: 90 capsule, Rfl: 3 .  potassium chloride (KLOR-CON) 20 MEQ packet, Take 20 mEq by mouth 2 (two) times daily.,  Disp: , Rfl:  .  pravastatin (PRAVACHOL) 20 MG tablet, TAKE 1 TABLET EVERY DAY, Disp: 90 tablet, Rfl: 4 .  tamsulosin (FLOMAX) 0.4 MG CAPS capsule, Take 1 capsule by mouth daily., Disp: , Rfl:  .  Triamcinolone Acetonide (NASACORT AQ NA), Place into the nose., Disp: , Rfl:  .  triamterene-hydrochlorothiazide (DYAZIDE) 37.5-25 MG capsule, TAKE 1 CAPSULE EVERY DAY, Disp: 90 capsule, Rfl: 3  Review of Systems  Constitutional: Negative for appetite change, chills and fever.  Respiratory: Negative for chest tightness, shortness of breath and wheezing.   Cardiovascular: Negative for chest pain and palpitations.  Gastrointestinal: Negative for abdominal pain, nausea and vomiting.    Social History  Substance Use Topics  . Smoking status: Never Smoker  . Smokeless tobacco: Former Systems developer    Types: Snuff, Chew  . Alcohol use No   Objective:   BP (!) 180/100 (BP Location: Left Arm, Patient Position: Sitting, Cuff Size: Large)   Pulse 62   Temp 97.7 F (36.5 C) (Oral)   Resp 16   Wt 228 lb (103.4 kg)   SpO2 98%   BMI 30.29 kg/m   Physical Exam   General Appearance:    Alert, cooperative, no distress  Eyes:    PERRL, conjunctiva/corneas clear, EOM's intact       Lungs:  Clear to auscultation bilaterally, respirations unlabored  Heart:    Regular rate and rhythm  Neurologic:   Awake, alert, oriented x 3. No apparent focal neurological           defect.           Assessment & Plan:     1. Essential (primary) hypertension Add amlodipine 5mg  daily. Counseled on potential adverse effects.   Return in about 4 weeks (around 07/20/2016).        Lelon Huh, MD  [facility]

## 2016-06-28 ENCOUNTER — Encounter: Payer: Self-pay | Admitting: Family Medicine

## 2016-06-28 DIAGNOSIS — K219 Gastro-esophageal reflux disease without esophagitis: Secondary | ICD-10-CM | POA: Diagnosis not present

## 2016-06-28 DIAGNOSIS — H9190 Unspecified hearing loss, unspecified ear: Secondary | ICD-10-CM | POA: Diagnosis not present

## 2016-06-28 DIAGNOSIS — R42 Dizziness and giddiness: Secondary | ICD-10-CM | POA: Diagnosis not present

## 2016-06-28 DIAGNOSIS — Z45321 Encounter for adjustment and management of cochlear device: Secondary | ICD-10-CM | POA: Diagnosis not present

## 2016-06-28 DIAGNOSIS — Z8249 Family history of ischemic heart disease and other diseases of the circulatory system: Secondary | ICD-10-CM | POA: Diagnosis not present

## 2016-06-28 DIAGNOSIS — H903 Sensorineural hearing loss, bilateral: Secondary | ICD-10-CM | POA: Diagnosis not present

## 2016-06-28 DIAGNOSIS — N400 Enlarged prostate without lower urinary tract symptoms: Secondary | ICD-10-CM | POA: Diagnosis not present

## 2016-06-28 DIAGNOSIS — E119 Type 2 diabetes mellitus without complications: Secondary | ICD-10-CM | POA: Diagnosis not present

## 2016-06-28 DIAGNOSIS — Z9621 Cochlear implant status: Secondary | ICD-10-CM | POA: Insufficient documentation

## 2016-06-28 DIAGNOSIS — E785 Hyperlipidemia, unspecified: Secondary | ICD-10-CM | POA: Diagnosis not present

## 2016-06-28 DIAGNOSIS — I10 Essential (primary) hypertension: Secondary | ICD-10-CM | POA: Diagnosis not present

## 2016-06-28 DIAGNOSIS — Z7982 Long term (current) use of aspirin: Secondary | ICD-10-CM | POA: Diagnosis not present

## 2016-06-28 HISTORY — PX: COCHLEAR IMPLANT: SUR684

## 2016-06-29 DIAGNOSIS — Z7982 Long term (current) use of aspirin: Secondary | ICD-10-CM | POA: Diagnosis not present

## 2016-06-29 DIAGNOSIS — I10 Essential (primary) hypertension: Secondary | ICD-10-CM | POA: Diagnosis not present

## 2016-06-29 DIAGNOSIS — K219 Gastro-esophageal reflux disease without esophagitis: Secondary | ICD-10-CM | POA: Diagnosis not present

## 2016-06-29 DIAGNOSIS — R42 Dizziness and giddiness: Secondary | ICD-10-CM | POA: Diagnosis not present

## 2016-06-29 DIAGNOSIS — E119 Type 2 diabetes mellitus without complications: Secondary | ICD-10-CM | POA: Diagnosis not present

## 2016-06-29 DIAGNOSIS — H903 Sensorineural hearing loss, bilateral: Secondary | ICD-10-CM | POA: Diagnosis not present

## 2016-06-30 ENCOUNTER — Encounter: Payer: Self-pay | Admitting: Family Medicine

## 2016-07-04 ENCOUNTER — Telehealth: Payer: Self-pay

## 2016-07-04 NOTE — Telephone Encounter (Signed)
Patients wife called office to ask Dr. Caryn Section if patient should continue on potassium? She states Dr. Laural Benes in [place] placed patient on medication do to low potassium level, she states that he is docotor who placed in implants for patient. She said two weeks ago potassium level was 3.3 and last week was 3.8. She states when she asked Dr. Idelle Crouch if patient needed to continue on supplement she was advised to contact patients PCP. Please review and advise. KW

## 2016-07-04 NOTE — Telephone Encounter (Signed)
Yes, have him continue potassium supplements for one month.

## 2016-07-04 NOTE — Telephone Encounter (Signed)
Patients wife has been advised. KW 

## 2016-07-12 ENCOUNTER — Other Ambulatory Visit: Payer: Self-pay | Admitting: Family Medicine

## 2016-07-18 ENCOUNTER — Encounter: Payer: Self-pay | Admitting: Family Medicine

## 2016-07-18 ENCOUNTER — Ambulatory Visit (INDEPENDENT_AMBULATORY_CARE_PROVIDER_SITE_OTHER): Payer: Medicare Other | Admitting: Family Medicine

## 2016-07-18 VITALS — BP 140/82 | HR 55 | Temp 97.6°F | Resp 16 | Wt 228.0 lb

## 2016-07-18 DIAGNOSIS — I10 Essential (primary) hypertension: Secondary | ICD-10-CM | POA: Diagnosis not present

## 2016-07-18 DIAGNOSIS — E876 Hypokalemia: Secondary | ICD-10-CM | POA: Diagnosis not present

## 2016-07-18 MED ORDER — POTASSIUM CHLORIDE CRYS ER 20 MEQ PO TBCR: 20.0000 meq | EXTENDED_RELEASE_TABLET | Freq: Every day | ORAL | 3 refills | Status: DC

## 2016-07-18 MED ORDER — AMLODIPINE BESYLATE 5 MG PO TABS: 5.0000 mg | ORAL_TABLET | Freq: Every day | ORAL | 4 refills | Status: DC

## 2016-07-18 NOTE — Progress Notes (Signed)
Patient: Adrian Thompson Male    DOB: 04/09/1946   71 y.o.   MRN: [id number] Visit Date: 07/18/2016  Today's Provider: Lelon Huh, MD   Chief Complaint  Patient presents with  . Hypertension    4 week follow up   Subjective:    HPI  Hypertension, follow-up:  BP Readings from Last 3 Encounters:  06/24/16 (!) 164/88  04/01/16 (!) 136/92  09/08/15 118/74    He was last seen for hypertension 3 weeks ago.  BP at that visit was 180/100. Management since that visit includes adding  Amlodipine 5mg  daily and advising patient to follow up in 4 weeks. He reports good compliance with treatment. He is not having side effects.  He is not exercising. He is adherent to low salt diet.   Outside blood pressures are running 125/86 . He is experiencing none.  Patient denies chest pain, chest pressure/discomfort, claudication, dyspnea, exertional chest pressure/discomfort, irregular heart beat, lower extremity edema, near-syncope, orthopnea, palpitations, paroxysmal nocturnal dyspnea, syncope and tachypnea.   Cardiovascular risk factors include advanced age (older than 12 for men, 24 for women), hypertension and male gender.  Use of agents associated with hypertension: NSAIDS.     Weight trend: fluctuating a bit Wt Readings from Last 3 Encounters:  06/24/16 228 lb (103.4 kg)  04/01/16 218 lb (98.9 kg)  09/08/15 219 lb (99.3 kg)    Current diet: well balanced  ------------------------------------------------------------------------     No Known Allergies   Current Outpatient Prescriptions:  .  amLODipine (NORVASC) 5 MG tablet, TAKE 1 TABLET(5 MG) BY MOUTH DAILY, Disp: 90 tablet, Rfl: 4 .  aspirin 81 MG tablet, Take 1 tablet by mouth daily., Disp: , Rfl:  .  atenolol (TENORMIN) 50 MG tablet, TAKE 1 TABLET EVERY DAY, Disp: 90 tablet, Rfl: 3 .  glucosamine-chondroitin 500-400 MG tablet, Take 1 tablet by mouth daily., Disp: , Rfl:  .  glucose blood (TRUETEST TEST) test strip,  1 strip daily., Disp: , Rfl:  .  indomethacin (INDOCIN) 25 MG capsule, Take 1 capsule by mouth. Up to 3 times a day, Disp: , Rfl:  .  Misc Natural Products (BLACK CHERRY CONCENTRATE PO), Take 1 tablet by mouth daily., Disp: , Rfl:  .  montelukast (SINGULAIR) 10 MG tablet, TAKE 1 TABLET EVERY DAY, Disp: 90 tablet, Rfl: 3 .  Multiple Vitamins-Minerals (MULTIVITAMIN ADULT PO), Take 1 tablet by mouth daily., Disp: , Rfl:  .  nortriptyline (PAMELOR) 25 MG capsule, TAKE 1 CAPSULE EVERY DAY, Disp: 90 capsule, Rfl: 3 .  Omega-3 Fatty Acids (FISH OIL) 1000 MG CPDR, Take 1 capsule by mouth daily., Disp: , Rfl:  .  omeprazole (PRILOSEC) 20 MG capsule, TAKE 1 CAPSULE EVERY DAY, Disp: 90 capsule, Rfl: 3 .  potassium chloride (KLOR-CON) 20 MEQ packet, Take 20 mEq by mouth 2 (two) times daily., Disp: , Rfl:  .  pravastatin (PRAVACHOL) 20 MG tablet, TAKE 1 TABLET EVERY DAY, Disp: 90 tablet, Rfl: 4 .  tamsulosin (FLOMAX) 0.4 MG CAPS capsule, Take 1 capsule by mouth daily., Disp: , Rfl:  .  Triamcinolone Acetonide (NASACORT AQ NA), Place into the nose., Disp: , Rfl:  .  triamterene-hydrochlorothiazide (DYAZIDE) 37.5-25 MG capsule, TAKE 1 CAPSULE EVERY DAY, Disp: 90 capsule, Rfl: 3  Review of Systems  Constitutional: Positive for fatigue. Negative for appetite change, chills and fever.  Respiratory: Negative for chest tightness, shortness of breath and wheezing.   Cardiovascular: Negative for chest pain and palpitations.  Gastrointestinal: Negative for abdominal pain, nausea and vomiting.  Neurological: Positive for weakness.    Social History  Substance Use Topics  . Smoking status: Never Smoker  . Smokeless tobacco: Former Systems developer    Types: Snuff, Chew  . Alcohol use No   Objective:   BP 140/82 (BP Location: Left Arm, Patient Position: Sitting, Cuff Size: Large)   Pulse (!) 55   Temp 97.6 F (36.4 C) (Oral)   Resp 16   Wt 228 lb (103.4 kg)   SpO2 99% Comment: room air  BMI 30.29 kg/m   Physical  Exam   General Appearance:    Alert, cooperative, no distress  Eyes:    PERRL, conjunctiva/corneas clear, EOM's intact       Lungs:     Clear to auscultation bilaterally, respirations unlabored  Heart:    Regular rate and rhythm  Neurologic:   Awake, alert, oriented x 3. No apparent focal neurological           defect.           Assessment & Plan:     1. Essential (primary) hypertension Better since starting amlodipine. Continue for now. Recheck in 3 months  - amLODipine (NORVASC) 5 MG tablet; Take 1 tablet (5 mg total) by mouth daily.  Dispense: 90 tablet; Refill: 4  2. Hypokalemia Normalized since starting potassium replacement. Will reduce from 2 a day to one a day.  - potassium chloride SA (K-DUR,KLOR-CON) 20 MEQ tablet; Take 1 tablet (20 mEq total) by mouth daily.  Dispense: 90 tablet; Refill: 3       Lelon Huh, MD  [facility]

## 2016-07-19 DIAGNOSIS — H903 Sensorineural hearing loss, bilateral: Secondary | ICD-10-CM | POA: Diagnosis not present

## 2016-07-19 DIAGNOSIS — Z45321 Encounter for adjustment and management of cochlear device: Secondary | ICD-10-CM | POA: Diagnosis not present

## 2016-07-20 DIAGNOSIS — N138 Other obstructive and reflux uropathy: Secondary | ICD-10-CM | POA: Diagnosis not present

## 2016-07-20 DIAGNOSIS — R339 Retention of urine, unspecified: Secondary | ICD-10-CM | POA: Diagnosis not present

## 2016-07-20 DIAGNOSIS — R972 Elevated prostate specific antigen [PSA]: Secondary | ICD-10-CM | POA: Diagnosis not present

## 2016-07-20 DIAGNOSIS — N411 Chronic prostatitis: Secondary | ICD-10-CM | POA: Diagnosis not present

## 2016-07-20 DIAGNOSIS — Z6829 Body mass index (BMI) 29.0-29.9, adult: Secondary | ICD-10-CM | POA: Diagnosis not present

## 2016-07-20 DIAGNOSIS — N403 Nodular prostate with lower urinary tract symptoms: Secondary | ICD-10-CM | POA: Diagnosis not present

## 2016-08-18 DIAGNOSIS — Z9621 Cochlear implant status: Secondary | ICD-10-CM | POA: Diagnosis not present

## 2016-08-18 DIAGNOSIS — H903 Sensorineural hearing loss, bilateral: Secondary | ICD-10-CM | POA: Diagnosis not present

## 2016-08-23 DIAGNOSIS — H903 Sensorineural hearing loss, bilateral: Secondary | ICD-10-CM | POA: Diagnosis not present

## 2016-09-02 ENCOUNTER — Ambulatory Visit: Payer: Self-pay | Admitting: Family Medicine

## 2016-09-26 DIAGNOSIS — H903 Sensorineural hearing loss, bilateral: Secondary | ICD-10-CM | POA: Diagnosis not present

## 2016-10-03 ENCOUNTER — Encounter: Payer: Self-pay | Admitting: Family Medicine

## 2016-10-03 ENCOUNTER — Ambulatory Visit (INDEPENDENT_AMBULATORY_CARE_PROVIDER_SITE_OTHER): Payer: Medicare Other | Admitting: Family Medicine

## 2016-10-03 VITALS — BP 140/84 | HR 53 | Temp 97.8°F | Resp 16 | Ht 72.0 in | Wt 227.0 lb

## 2016-10-03 DIAGNOSIS — E119 Type 2 diabetes mellitus without complications: Secondary | ICD-10-CM

## 2016-10-03 DIAGNOSIS — I10 Essential (primary) hypertension: Secondary | ICD-10-CM

## 2016-10-03 DIAGNOSIS — R809 Proteinuria, unspecified: Secondary | ICD-10-CM

## 2016-10-03 LAB — POCT GLYCOSYLATED HEMOGLOBIN (HGB A1C)
ESTIMATED AVERAGE GLUCOSE: 134
Hemoglobin A1C: 6.3

## 2016-10-03 LAB — POCT UA - MICROALBUMIN: MICROALBUMIN (UR) POC: 20 mg/L

## 2016-10-03 MED ORDER — LOSARTAN POTASSIUM-HCTZ 100-12.5 MG PO TABS: 1.0000 | ORAL_TABLET | Freq: Every day | ORAL | 3 refills | Status: DC

## 2016-10-03 NOTE — Progress Notes (Signed)
Patient: Adrian Thompson Male    DOB: 1946/04/17   71 y.o.   MRN: [id number] Visit Date: 10/03/2016  Today's Provider: Lelon Huh, MD   Chief Complaint  Patient presents with  . Hypertension  . Diabetes   Subjective:    HPI  Hypertension, follow-up:  BP Readings from Last 3 Encounters:  07/18/16 140/82  06/24/16 (!) 164/88  04/01/16 (!) 136/92    He was last seen for hypertension 3 months ago.  BP at that visit was 140/82. Management since that visit includes no changes. He reports good compliance with treatment. He is not having side effects.  He is not exercising. He is adherent to low salt diet.   Outside blood pressures are normal per patient report. He is experiencing none.  Patient denies chest pain, chest pressure/discomfort, claudication, dyspnea, exertional chest pressure/discomfort, fatigue, irregular heart beat, lower extremity edema, near-syncope, orthopnea, palpitations, paroxysmal nocturnal dyspnea, syncope and tachypnea.   Cardiovascular risk factors include advanced age (older than 7 for men, 60 for women), diabetes mellitus, hypertension, male gender and sedentary lifestyle.  Use of agents associated with hypertension: NSAIDS.     Weight trend: stable Wt Readings from Last 3 Encounters:  07/18/16 228 lb (103.4 kg)  06/24/16 228 lb (103.4 kg)  04/01/16 218 lb (98.9 kg)    Current diet: well balanced  ------------------------------------------------------------------------  Diabetes Mellitus Type II, Follow-up:   Lab Results  Component Value Date   HGBA1C 6.5 (H) 04/01/2016   HGBA1C 6.3 09/08/2015   HGBA1C 6.6 (H) 03/02/2015    Last seen for diabetes 6 months ago.  Management since then includes no changes. He reports good compliance with treatment. He is not having side effects.  Current symptoms include none and have been stable. Home blood sugar records: fasting range: 110-120  Episodes of hypoglycemia?  occasionally   Current Insulin Regimen: none Most Recent Eye Exam: 05/2016 Weight trend: stable Prior visit with dietician: no Current diet: well balanced Current exercise: none  Pertinent Labs:    Component Value Date/Time   CHOL 150 04/01/2016 1116   TRIG 109 04/01/2016 1116   HDL 57 04/01/2016 1116   LDLCALC 71 04/01/2016 1116   CREATININE 0.96 04/01/2016 1116    Wt Readings from Last 3 Encounters:  07/18/16 228 lb (103.4 kg)  06/24/16 228 lb (103.4 kg)  04/01/16 218 lb (98.9 kg)    ------------------------------------------------------------------------ Follow up Microalbuminuria:  Patient was last seen for this problem 6 months ago and no changes were made.   Follow up of Hypokalemia:  Patient was last seen for this problem 3 months ago. Changes made during that visit includes changing KLOR- Con  From 2 pills daily to 1 pill daily.     No Known Allergies   Current Outpatient Prescriptions:  .  amLODipine (NORVASC) 5 MG tablet, Take 1 tablet (5 mg total) by mouth daily., Disp: 90 tablet, Rfl: 4 .  aspirin 81 MG tablet, Take 1 tablet by mouth daily., Disp: , Rfl:  .  atenolol (TENORMIN) 50 MG tablet, TAKE 1 TABLET EVERY DAY, Disp: 90 tablet, Rfl: 3 .  glucosamine-chondroitin 500-400 MG tablet, Take 1 tablet by mouth daily., Disp: , Rfl:  .  glucose blood (TRUETEST TEST) test strip, 1 strip daily., Disp: , Rfl:  .  indomethacin (INDOCIN) 25 MG capsule, Take 1 capsule by mouth. Up to 3 times a day, Disp: , Rfl:  .  Misc Natural Products (BLACK CHERRY CONCENTRATE PO), Take  1 tablet by mouth daily., Disp: , Rfl:  .  montelukast (SINGULAIR) 10 MG tablet, TAKE 1 TABLET EVERY DAY, Disp: 90 tablet, Rfl: 3 .  Multiple Vitamins-Minerals (MULTIVITAMIN ADULT PO), Take 1 tablet by mouth daily., Disp: , Rfl:  .  nortriptyline (PAMELOR) 25 MG capsule, TAKE 1 CAPSULE EVERY DAY, Disp: 90 capsule, Rfl: 3 .  Omega-3 Fatty Acids (FISH OIL) 1000 MG CPDR, Take 1 capsule by mouth daily.,  Disp: , Rfl:  .  omeprazole (PRILOSEC) 20 MG capsule, TAKE 1 CAPSULE EVERY DAY, Disp: 90 capsule, Rfl: 3 .  potassium chloride SA (K-DUR,KLOR-CON) 20 MEQ tablet, Take 1 tablet (20 mEq total) by mouth daily., Disp: 90 tablet, Rfl: 3 .  pravastatin (PRAVACHOL) 20 MG tablet, TAKE 1 TABLET EVERY DAY, Disp: 90 tablet, Rfl: 4 .  tamsulosin (FLOMAX) 0.4 MG CAPS capsule, Take 1 capsule by mouth daily., Disp: , Rfl:  .  Triamcinolone Acetonide (NASACORT AQ NA), Place into the nose., Disp: , Rfl:  .  triamterene-hydrochlorothiazide (DYAZIDE) 37.5-25 MG capsule, TAKE 1 CAPSULE EVERY DAY, Disp: 90 capsule, Rfl: 3  Review of Systems  Constitutional: Negative for appetite change, chills and fever.  Respiratory: Negative for chest tightness, shortness of breath and wheezing.   Cardiovascular: Negative for chest pain and palpitations.  Gastrointestinal: Negative for abdominal pain, nausea and vomiting.  Endocrine: Negative for cold intolerance, heat intolerance, polydipsia, polyphagia and polyuria.    Social History  Substance Use Topics  . Smoking status: Never Smoker  . Smokeless tobacco: Former Systems developer    Types: Snuff, Chew  . Alcohol use No   Objective:   BP 140/84 (BP Location: Left Arm, Patient Position: Sitting, Cuff Size: Large)   Pulse (!) 53   Temp 97.8 F (36.6 C) (Oral)   Resp 16   Ht 6' (1.829 m)   Wt 227 lb (103 kg)   SpO2 99% Comment: room air  BMI 30.79 kg/m  There were no vitals filed for this visit.   Physical Exam  General appearance: alert, well developed, well nourished, cooperative and in no distress Head: Normocephalic, without obvious abnormality, atraumatic Respiratory: Respirations even and unlabored, normal respiratory rate Extremities: No gross deformities Skin: Skin color, texture, turgor normal. No rashes seen  Psych: Appropriate mood and affect. Neurologic: Mental status: Alert, oriented to person, place, and time, thought content appropriate.  Results for  orders placed or performed in visit on 10/03/16  POCT HgB A1C  Result Value Ref Range   Hemoglobin A1C 6.3    Est. average glucose Bld gHb Est-mCnc 134   POCT UA - Microalbumin  Result Value Ref Range   Microalbumin Ur, POC 20 mg/L   Creatinine, POC n/a mg/dL   Albumin/Creatinine Ratio, Urine, POC n/a        Assessment & Plan:     1. Type 2 diabetes mellitus without complication, without long-term current use of insulin (HCC) Well controlled.   - POCT HgB A1C - POCT UA - Microalbumin  2. Essential (primary) hypertension Change from triamterene-hctz to  - losartan-hydrochlorothiazide (HYZAAR) 100-12.5 MG tablet; Take 1 tablet by mouth daily.  Dispense: 90 tablet; Refill: 3  3. Microalbuminuria Stable, start ARB as above.   Check renal and BP in 3-4 weeks.      The entirety of the information documented in the History of Present Illness, Review of Systems and Physical Exam were personally obtained by me. Portions of this information were initially documented by Meyer Cory, CMA and reviewed by me  for thoroughness and accuracy.     Lelon Huh, MD  [facility]

## 2016-10-10 ENCOUNTER — Telehealth: Payer: Self-pay | Admitting: Family Medicine

## 2016-10-10 NOTE — Telephone Encounter (Signed)
Spoke with pharmacy; they needed to verify the change in therapy from triamterene-HCTZ to [place]. Renaldo Fiddler, CMA

## 2016-10-10 NOTE — Telephone Encounter (Signed)
Pt's wife called saying the pharmacy told her they need clarification on the losartan.  Order # [id number]  Rx # [id number]  [facility] mail order  [phone]  Thanks, Con Memos

## 2016-10-18 DIAGNOSIS — H903 Sensorineural hearing loss, bilateral: Secondary | ICD-10-CM | POA: Diagnosis not present

## 2016-10-18 DIAGNOSIS — Z45321 Encounter for adjustment and management of cochlear device: Secondary | ICD-10-CM | POA: Diagnosis not present

## 2016-10-19 ENCOUNTER — Ambulatory Visit: Payer: Medicare Other | Admitting: Family Medicine

## 2016-10-25 DIAGNOSIS — H903 Sensorineural hearing loss, bilateral: Secondary | ICD-10-CM | POA: Diagnosis not present

## 2016-11-02 ENCOUNTER — Encounter: Payer: Self-pay | Admitting: Family Medicine

## 2016-11-02 ENCOUNTER — Ambulatory Visit (INDEPENDENT_AMBULATORY_CARE_PROVIDER_SITE_OTHER): Payer: Medicare Other | Admitting: Family Medicine

## 2016-11-02 VITALS — BP 140/82 | HR 57 | Temp 99.0°F | Resp 16 | Wt 227.0 lb

## 2016-11-02 DIAGNOSIS — R609 Edema, unspecified: Secondary | ICD-10-CM

## 2016-11-02 NOTE — Progress Notes (Signed)
Patient: Adrian Thompson Male    DOB: [birth year]   71 y.o.   MRN: [id number] Visit Date: 11/02/2016  Today's Provider: Lelon Huh, MD   Chief Complaint  Patient presents with  . Foot Swelling   Subjective:    HPI Edema:  Patient comes in today complaining of swelling in both feet for the past 2 weeks. Patient states he thought he was having a gout flare so he tried taking Indomethacin with no relief. Patient denies any pain in feet or shortness breath. Patient has had some warmth in his feet occasionally. He was Changed from triamterene-hctz to losartan-hydrochlorothiazide at his visit on 10-03-2016, but mail order prescription just arrived about 2 weeks ago. Denies any dietary changes. No change in physical activity.     No Known Allergies   Current Outpatient Prescriptions:  .  amLODipine (NORVASC) 5 MG tablet, Take 1 tablet (5 mg total) by mouth daily., Disp: 90 tablet, Rfl: 4 .  aspirin 81 MG tablet, Take 1 tablet by mouth daily., Disp: , Rfl:  .  atenolol (TENORMIN) 50 MG tablet, TAKE 1 TABLET EVERY DAY, Disp: 90 tablet, Rfl: 3 .  glucosamine-chondroitin 500-400 MG tablet, Take 1 tablet by mouth daily., Disp: , Rfl:  .  glucose blood (TRUETEST TEST) test strip, 1 strip daily., Disp: , Rfl:  .  indomethacin (INDOCIN) 25 MG capsule, Take 1 capsule by mouth. Up to 3 times a day, Disp: , Rfl:  .  losartan-hydrochlorothiazide (HYZAAR) 100-12.5 MG tablet, Take 1 tablet by mouth daily., Disp: 90 tablet, Rfl: 3 .  Misc Natural Products (BLACK CHERRY CONCENTRATE PO), Take 1 tablet by mouth daily., Disp: , Rfl:  .  montelukast (SINGULAIR) 10 MG tablet, TAKE 1 TABLET EVERY DAY, Disp: 90 tablet, Rfl: 3 .  Multiple Vitamins-Minerals (MULTIVITAMIN ADULT PO), Take 1 tablet by mouth daily., Disp: , Rfl:  .  nortriptyline (PAMELOR) 25 MG capsule, TAKE 1 CAPSULE EVERY DAY, Disp: 90 capsule, Rfl: 3 .  Omega-3 Fatty Acids (FISH OIL) 1000 MG CPDR, Take 1 capsule by mouth daily., Disp: , Rfl:    .  omeprazole (PRILOSEC) 20 MG capsule, TAKE 1 CAPSULE EVERY DAY, Disp: 90 capsule, Rfl: 3 .  potassium chloride SA (K-DUR,KLOR-CON) 20 MEQ tablet, Take 1 tablet (20 mEq total) by mouth daily., Disp: 90 tablet, Rfl: 3 .  pravastatin (PRAVACHOL) 20 MG tablet, TAKE 1 TABLET EVERY DAY, Disp: 90 tablet, Rfl: 4 .  tamsulosin (FLOMAX) 0.4 MG CAPS capsule, Take 1 capsule by mouth daily., Disp: , Rfl:  .  Triamcinolone Acetonide (NASACORT AQ NA), Place into the nose., Disp: , Rfl:   Review of Systems  Constitutional: Negative for appetite change, chills and fever.  Respiratory: Negative for chest tightness, shortness of breath and wheezing.   Cardiovascular: Negative for chest pain and palpitations.  Gastrointestinal: Negative for abdominal pain, nausea and vomiting.  Musculoskeletal: Positive for joint swelling (both feet).       Decreased ROM in left elbow/ arm    Social History  Substance Use Topics  . Smoking status: Never Smoker  . Smokeless tobacco: Former Systems developer    Types: Snuff, Chew  . Alcohol use No   Objective:   BP 140/82 (BP Location: Left Arm, Patient Position: Sitting, Cuff Size: Large)   Pulse (!) 57   Temp 99 F (37.2 C) (Oral)   Resp 16   Wt 227 lb (103 kg)   SpO2 97% Comment: room air  BMI 30.79  kg/m  Vitals:   11/02/16 1048  Resp: 16  Weight: 227 lb (103 kg)     Physical Exam   General Appearance:    Alert, cooperative, no distress  Eyes:    PERRL, conjunctiva/corneas clear, EOM's intact       Lungs:     Clear to auscultation bilaterally, respirations unlabored  Heart:    Regular rate and rhythm  Neurologic:   Trace bipedal edema. No ankle of foot tenderness. No erythema.           Assessment & Plan:     1. Edema, unspecified type Likely secondary to amlodipine and atenolol, and exacerbated by recent change from triamterene-hctz to losartan-hctz. Check labs.   - CBC - Comprehensive metabolic panel - TSH       Lelon Huh, MD  [facility]

## 2016-11-03 LAB — COMPREHENSIVE METABOLIC PANEL
A/G RATIO: 2 (ref 1.2–2.2)
ALT: 21 IU/L (ref 0–44)
AST: 16 IU/L (ref 0–40)
Albumin: 4.1 g/dL (ref 3.6–4.8)
Alkaline Phosphatase: 53 IU/L (ref 39–117)
BUN/Creatinine Ratio: 14 (ref 10–24)
BUN: 13 mg/dL (ref 8–27)
Bilirubin Total: 0.5 mg/dL (ref 0.0–1.2)
CALCIUM: 9.2 mg/dL (ref 8.6–10.2)
CO2: 26 mmol/L (ref 20–29)
CREATININE: 0.9 mg/dL (ref 0.76–1.27)
Chloride: 102 mmol/L (ref 96–106)
GFR calc Af Amer: 100 mL/min/{1.73_m2} (ref >59)
GFR, EST NON AFRICAN AMERICAN: 87 mL/min/{1.73_m2} (ref >59)
GLUCOSE: 136 mg/dL — AB (ref 65–99)
Globulin, Total: 2.1 g/dL (ref 1.5–4.5)
POTASSIUM: 3.7 mmol/L (ref 3.5–5.2)
Sodium: 142 mmol/L (ref 134–144)
Total Protein: 6.2 g/dL (ref 6.0–8.5)

## 2016-11-03 LAB — TSH: TSH: 1.31 u[IU]/mL (ref 0.450–4.500)

## 2016-11-03 LAB — CBC
Hematocrit: 41.5 % (ref 37.5–51.0)
Hemoglobin: 14.1 g/dL (ref 13.0–17.7)
MCH: 29.9 pg (ref 26.6–33.0)
MCHC: 34 g/dL (ref 31.5–35.7)
MCV: 88 fL (ref 79–97)
PLATELETS: 211 10*3/uL (ref 150–379)
RBC: 4.72 x10E6/uL (ref 4.14–5.80)
RDW: 13.5 % (ref 12.3–15.4)
WBC: 5.6 10*3/uL (ref 3.4–10.8)

## 2016-11-07 ENCOUNTER — Telehealth: Payer: Self-pay | Admitting: Family Medicine

## 2016-11-07 ENCOUNTER — Other Ambulatory Visit: Payer: Self-pay

## 2016-11-07 MED ORDER — HYDROCHLOROTHIAZIDE 12.5 MG PO CAPS: 12.5000 mg | ORAL_CAPSULE | Freq: Every day | ORAL | 1 refills | Status: DC

## 2016-11-07 NOTE — Telephone Encounter (Signed)
Pt returned call  [phone]  Con Memos

## 2016-11-07 NOTE — Progress Notes (Signed)
Advised and med sent to pharmacy

## 2016-11-07 NOTE — Telephone Encounter (Signed)
LMOVM to return call.

## 2016-11-25 DIAGNOSIS — H903 Sensorineural hearing loss, bilateral: Secondary | ICD-10-CM | POA: Diagnosis not present

## 2016-12-12 ENCOUNTER — Encounter: Payer: Self-pay | Admitting: Family Medicine

## 2016-12-12 ENCOUNTER — Ambulatory Visit (INDEPENDENT_AMBULATORY_CARE_PROVIDER_SITE_OTHER): Payer: Medicare Other | Admitting: Family Medicine

## 2016-12-12 VITALS — BP 120/74 | HR 62 | Temp 98.2°F | Resp 16 | Ht 72.0 in | Wt 222.0 lb

## 2016-12-12 DIAGNOSIS — R609 Edema, unspecified: Secondary | ICD-10-CM

## 2016-12-12 DIAGNOSIS — I10 Essential (primary) hypertension: Secondary | ICD-10-CM

## 2016-12-12 MED ORDER — LOSARTAN POTASSIUM-HCTZ 100-25 MG PO TABS: 1.0000 | ORAL_TABLET | Freq: Every day | ORAL | 3 refills | Status: DC

## 2016-12-12 NOTE — Progress Notes (Signed)
Patient: Adrian Thompson Male    DOB: 21-Sep-1945   71 y.o.   MRN: [id number] Visit Date: 12/12/2016  Today's Provider: Lelon Huh, MD   Chief Complaint  Patient presents with  . Follow-up  . Hypertension  . Edema   Subjective:    HPI  Hypertension, follow-up:  BP Readings from Last 3 Encounters:  12/12/16 120/74  11/02/16 140/82  10/03/16 140/84    He was last seen for hypertension 1 months ago.  BP at that visit was 140/82. Management since that visit includes; labs checked. Added HCTZ  12.5 mg qd. Advised to follow up for Bp check in 1 month.He reports good compliance with treatment. He is not having side effects. none He is exercising. He is adherent to low salt diet.   Outside blood pressures are 140/80. He is experiencing none.  Patient denies none.   Cardiovascular risk factors include none.  Use of agents associated with hypertension: none.   ----------------------------------------------------------------  Edema, unspecified type From 11/02/2016- labs checked. Added HCTZ  12.5 mg qd. Advised to follow up for Bp check in 1 month.   No Known Allergies   Current Outpatient Prescriptions:  .  amLODipine (NORVASC) 5 MG tablet, Take 1 tablet (5 mg total) by mouth daily., Disp: 90 tablet, Rfl: 4 .  aspirin 81 MG tablet, Take 1 tablet by mouth daily., Disp: , Rfl:  .  atenolol (TENORMIN) 50 MG tablet, TAKE 1 TABLET EVERY DAY, Disp: 90 tablet, Rfl: 3 .  glucosamine-chondroitin 500-400 MG tablet, Take 1 tablet by mouth daily., Disp: , Rfl:  .  glucose blood (TRUETEST TEST) test strip, 1 strip daily., Disp: , Rfl:  .  hydrochlorothiazide (MICROZIDE) 12.5 MG capsule, Take 1 capsule (12.5 mg total) by mouth daily., Disp: 30 capsule, Rfl: 1 .  indomethacin (INDOCIN) 25 MG capsule, Take 1 capsule by mouth. Up to 3 times a day, Disp: , Rfl:  .  losartan-hydrochlorothiazide (HYZAAR) 100-12.5 MG tablet, Take 1 tablet by mouth daily., Disp: 90 tablet, Rfl: 3 .  Misc  Natural Products (BLACK CHERRY CONCENTRATE PO), Take 1 tablet by mouth daily., Disp: , Rfl:  .  Misc Natural Products (BLACK CHERRY CONCENTRATE PO), Place under the tongue 2 (two) times daily., Disp: , Rfl:  .  montelukast (SINGULAIR) 10 MG tablet, TAKE 1 TABLET EVERY DAY, Disp: 90 tablet, Rfl: 3 .  Multiple Vitamins-Minerals (MULTIVITAMIN ADULT PO), Take 1 tablet by mouth daily., Disp: , Rfl:  .  nortriptyline (PAMELOR) 25 MG capsule, TAKE 1 CAPSULE EVERY DAY, Disp: 90 capsule, Rfl: 3 .  Omega-3 Fatty Acids (FISH OIL) 1000 MG CPDR, Take 1 capsule by mouth daily., Disp: , Rfl:  .  omeprazole (PRILOSEC) 20 MG capsule, TAKE 1 CAPSULE EVERY DAY, Disp: 90 capsule, Rfl: 3 .  potassium chloride SA (K-DUR,KLOR-CON) 20 MEQ tablet, Take 1 tablet (20 mEq total) by mouth daily., Disp: 90 tablet, Rfl: 3 .  pravastatin (PRAVACHOL) 20 MG tablet, TAKE 1 TABLET EVERY DAY, Disp: 90 tablet, Rfl: 4 .  tamsulosin (FLOMAX) 0.4 MG CAPS capsule, Take 1 capsule by mouth daily., Disp: , Rfl:  .  Triamcinolone Acetonide (NASACORT AQ NA), Place into the nose., Disp: , Rfl:   Review of Systems  Constitutional: Negative for appetite change, chills and fever.  Respiratory: Negative for chest tightness, shortness of breath and wheezing.   Cardiovascular: Negative for chest pain and palpitations.  Gastrointestinal: Negative for abdominal pain, nausea and vomiting.  Social History  Substance Use Topics  . Smoking status: Never Smoker  . Smokeless tobacco: Former Systems developer    Types: Snuff, Chew  . Alcohol use No   Objective:   BP 120/74 (BP Location: Right Arm, Patient Position: Sitting, Cuff Size: Large)   Pulse 62   Temp 98.2 F (36.8 C) (Oral)   Resp 16   Ht 6' (1.829 m)   Wt 222 lb (100.7 kg)   SpO2 98%   BMI 30.11 kg/m     Physical Exam   General Appearance:    Alert, cooperative, no distress  Eyes:    PERRL, conjunctiva/corneas clear, EOM's intact       Lungs:     Clear to auscultation bilaterally,  respirations unlabored  Heart:    Regular rate and rhythm. Trace pedal edema.   Neurologic:   Awake, alert, oriented x 3. No apparent focal neurological           defect.           Assessment & Plan:      1. Essential (primary) hypertension Well controlled.  Change losartan-hctz 100-12.5 and hctz 12.5 to losartan 100-25 daily.   2. Edema, unspecified type Exacerbated by CCB and beta-blocker, but improved with increased amount of daily hctz as above.   Return in about 3 months (around 03/14/2017) for diabetes.      The entirety of the information documented in the History of Present Illness, Review of Systems and Physical Exam were personally obtained by me. Portions of this information were initially documented by April M. Sabra Heck, CMA and reviewed by me for thoroughness and accuracy.    Lelon Huh, MD  [facility]

## 2016-12-26 DIAGNOSIS — H903 Sensorineural hearing loss, bilateral: Secondary | ICD-10-CM | POA: Diagnosis not present

## 2017-01-11 DIAGNOSIS — L538 Other specified erythematous conditions: Secondary | ICD-10-CM | POA: Diagnosis not present

## 2017-01-11 DIAGNOSIS — L821 Other seborrheic keratosis: Secondary | ICD-10-CM | POA: Diagnosis not present

## 2017-01-11 DIAGNOSIS — L820 Inflamed seborrheic keratosis: Secondary | ICD-10-CM | POA: Diagnosis not present

## 2017-01-11 DIAGNOSIS — L298 Other pruritus: Secondary | ICD-10-CM | POA: Diagnosis not present

## 2017-01-17 DIAGNOSIS — Z45321 Encounter for adjustment and management of cochlear device: Secondary | ICD-10-CM | POA: Diagnosis not present

## 2017-01-17 DIAGNOSIS — H903 Sensorineural hearing loss, bilateral: Secondary | ICD-10-CM | POA: Diagnosis not present

## 2017-01-24 DIAGNOSIS — R972 Elevated prostate specific antigen [PSA]: Secondary | ICD-10-CM | POA: Diagnosis not present

## 2017-01-26 DIAGNOSIS — H903 Sensorineural hearing loss, bilateral: Secondary | ICD-10-CM | POA: Diagnosis not present

## 2017-02-04 ENCOUNTER — Other Ambulatory Visit: Payer: Self-pay | Admitting: Family Medicine

## 2017-02-06 DIAGNOSIS — Z6829 Body mass index (BMI) 29.0-29.9, adult: Secondary | ICD-10-CM | POA: Diagnosis not present

## 2017-02-06 DIAGNOSIS — H903 Sensorineural hearing loss, bilateral: Secondary | ICD-10-CM | POA: Diagnosis not present

## 2017-02-20 DIAGNOSIS — Z23 Encounter for immunization: Secondary | ICD-10-CM | POA: Diagnosis not present

## 2017-02-23 DIAGNOSIS — N411 Chronic prostatitis: Secondary | ICD-10-CM | POA: Diagnosis not present

## 2017-02-23 DIAGNOSIS — N403 Nodular prostate with lower urinary tract symptoms: Secondary | ICD-10-CM | POA: Diagnosis not present

## 2017-02-23 DIAGNOSIS — N138 Other obstructive and reflux uropathy: Secondary | ICD-10-CM | POA: Diagnosis not present

## 2017-02-23 DIAGNOSIS — R972 Elevated prostate specific antigen [PSA]: Secondary | ICD-10-CM | POA: Diagnosis not present

## 2017-02-23 DIAGNOSIS — R339 Retention of urine, unspecified: Secondary | ICD-10-CM | POA: Diagnosis not present

## 2017-02-23 DIAGNOSIS — Z6829 Body mass index (BMI) 29.0-29.9, adult: Secondary | ICD-10-CM | POA: Diagnosis not present

## 2017-02-24 DIAGNOSIS — H903 Sensorineural hearing loss, bilateral: Secondary | ICD-10-CM | POA: Diagnosis not present

## 2017-03-09 ENCOUNTER — Other Ambulatory Visit: Payer: Self-pay | Admitting: *Deleted

## 2017-03-09 MED ORDER — GLUCOSE BLOOD VI STRP: 1.0000 | ORAL_STRIP | 4 refills | Status: DC | PRN

## 2017-03-14 ENCOUNTER — Ambulatory Visit: Payer: Self-pay | Admitting: Family Medicine

## 2017-03-14 ENCOUNTER — Other Ambulatory Visit: Payer: Self-pay | Admitting: Family Medicine

## 2017-03-14 MED ORDER — GLUCOSE BLOOD VI STRP: ORAL_STRIP | 4 refills | Status: DC

## 2017-04-03 ENCOUNTER — Ambulatory Visit (INDEPENDENT_AMBULATORY_CARE_PROVIDER_SITE_OTHER): Payer: Medicare Other

## 2017-04-03 ENCOUNTER — Ambulatory Visit (INDEPENDENT_AMBULATORY_CARE_PROVIDER_SITE_OTHER): Payer: Medicare Other | Admitting: Family Medicine

## 2017-04-03 VITALS — BP 160/96

## 2017-04-03 VITALS — BP 170/100 | HR 60 | Temp 97.7°F | Ht 72.0 in | Wt 220.8 lb

## 2017-04-03 DIAGNOSIS — E785 Hyperlipidemia, unspecified: Secondary | ICD-10-CM

## 2017-04-03 DIAGNOSIS — E119 Type 2 diabetes mellitus without complications: Secondary | ICD-10-CM

## 2017-04-03 DIAGNOSIS — I10 Essential (primary) hypertension: Secondary | ICD-10-CM | POA: Diagnosis not present

## 2017-04-03 DIAGNOSIS — Z0000 Encounter for general adult medical examination without abnormal findings: Secondary | ICD-10-CM | POA: Diagnosis not present

## 2017-04-03 LAB — COMPLETE METABOLIC PANEL WITH GFR
AG RATIO: 1.7 (calc) (ref 1.0–2.5)
ALKALINE PHOSPHATASE (APISO): 56 U/L (ref 40–115)
ALT: 17 U/L (ref 9–46)
AST: 17 U/L (ref 10–35)
Albumin: 4.3 g/dL (ref 3.6–5.1)
BILIRUBIN TOTAL: 0.7 mg/dL (ref 0.2–1.2)
BUN: 11 mg/dL (ref 7–25)
CHLORIDE: 104 mmol/L (ref 98–110)
CO2: 30 mmol/L (ref 20–32)
Calcium: 9.6 mg/dL (ref 8.6–10.3)
Creat: 0.93 mg/dL (ref 0.70–1.18)
GFR, EST NON AFRICAN AMERICAN: 83 mL/min/{1.73_m2} (ref >=60)
GFR, Est African American: 96 mL/min/{1.73_m2} (ref >=60)
GLOBULIN: 2.6 g/dL (ref 1.9–3.7)
Glucose, Bld: 135 mg/dL — ABNORMAL HIGH (ref 65–99)
POTASSIUM: 3.8 mmol/L (ref 3.5–5.3)
SODIUM: 142 mmol/L (ref 135–146)
Total Protein: 6.9 g/dL (ref 6.1–8.1)

## 2017-04-03 LAB — POCT GLYCOSYLATED HEMOGLOBIN (HGB A1C)
ESTIMATED AVERAGE GLUCOSE: 137
Hemoglobin A1C: 6.4

## 2017-04-03 LAB — LIPID PANEL
CHOLESTEROL: 156 mg/dL (ref <200)
HDL: 67 mg/dL (ref >40)
LDL Cholesterol (Calc): 72 mg/dL (calc)
Non-HDL Cholesterol (Calc): 89 mg/dL (calc) (ref <130)
TRIGLYCERIDES: 86 mg/dL (ref <150)
Total CHOL/HDL Ratio: 2.3 (calc) (ref <5.0)

## 2017-04-03 NOTE — Progress Notes (Signed)
Patient: Adrian Thompson Male    DOB: 08/25/1945   71 y.o.   MRN: [id number] Visit Date: 04/03/2017  Today's Provider: Lelon Huh, MD   Chief Complaint  Patient presents with  . Diabetes    follow up  . Hypertension    follow up  . Edema    follow up   Subjective:     Patient saw McKenzie at 8:30 am today for AVW.  HPI   Diabetes Mellitus Type II, Follow-up:   Lab Results  Component Value Date   HGBA1C 6.3 10/03/2016   HGBA1C 6.5 (H) 04/01/2016   HGBA1C 6.3 09/08/2015   Last seen for diabetes 6 months ago.  Management since then includes; no changes. He reports good compliance with treatment. He is not having side effects.  Current symptoms include none and have been stable. Home blood sugar records: fasting range: 110-125  Episodes of hypoglycemia? no   Current Insulin Regimen: none Most Recent Eye Exam: 05/2016 Weight trend: stable Prior visit with dietician: no Current diet: well balanced Current exercise: none  ------------------------------------------------------------------------   Hypertension, follow-up:  BP Readings from Last 3 Encounters:  12/12/16 120/74  11/02/16 140/82  10/03/16 140/84    He was last seen for hypertension 4 months ago.  BP at that visit was 120/74. Management since that visit includes;Changed losartan-hctz 100-12.5 and hctz 12.5 to losartan 100-25 daily .He reports fair compliance with treatment. He is not having side effects.  He is not exercising. He is adherent to low salt diet.   Outside blood pressures are are not being checked. He is experiencing none.  Patient denies chest pain, chest pressure/discomfort, claudication, dyspnea, exertional chest pressure/discomfort, fatigue, irregular heart beat, lower extremity edema, near-syncope, orthopnea, palpitations, paroxysmal nocturnal dyspnea, syncope and tachypnea.   Cardiovascular risk factors include advanced age (older than 41 for men, 41 for women), diabetes  mellitus, hypertension and male gender.  Use of agents associated with hypertension: NSAIDS.   ------------------------------------------------------------------------ Edema, unspecified type From 12/12/2016- improved with increased amount of daily hctz as above.  Patient reports taht he has not hand any recent swelling     No Known Allergies   Current Outpatient Medications:  .  amLODipine (NORVASC) 5 MG tablet, Take 1 tablet (5 mg total) by mouth daily., Disp: 90 tablet, Rfl: 4 .  aspirin 81 MG tablet, Take 1 tablet by mouth daily., Disp: , Rfl:  .  atenolol (TENORMIN) 50 MG tablet, TAKE 1 TABLET EVERY DAY, Disp: 90 tablet, Rfl: 3 .  glucosamine-chondroitin 500-400 MG tablet, Take 1 tablet by mouth daily., Disp: , Rfl:  .  glucose blood (ACCU-CHEK AVIVA PLUS) test strip, Use as instructed to check blood sugar once daily. Dx type 2 diabetes. (E11.9), Disp: 100 each, Rfl: 4 .  indomethacin (INDOCIN) 25 MG capsule, Take 1 capsule by mouth. Up to 3 times a day, Disp: , Rfl:  .  losartan-hydrochlorothiazide (HYZAAR) 100-25 MG tablet, TAKE 1 TABLET EVERY DAY, Disp: 90 tablet, Rfl: 3 .  Misc Natural Products (BLACK CHERRY CONCENTRATE PO), Take 1 tablet by mouth daily., Disp: , Rfl:  .  Misc Natural Products (BLACK CHERRY CONCENTRATE PO), Place under the tongue 2 (two) times daily., Disp: , Rfl:  .  montelukast (SINGULAIR) 10 MG tablet, TAKE 1 TABLET EVERY DAY, Disp: 90 tablet, Rfl: 3 .  Multiple Vitamins-Minerals (MULTIVITAMIN ADULT PO), Take 1 tablet by mouth daily., Disp: , Rfl:  .  nortriptyline (PAMELOR) 25 MG capsule, TAKE  1 CAPSULE EVERY DAY, Disp: 90 capsule, Rfl: 3 .  Omega-3 Fatty Acids (FISH OIL) 1000 MG CPDR, Take 1 capsule by mouth daily., Disp: , Rfl:  .  omeprazole (PRILOSEC) 20 MG capsule, TAKE 1 CAPSULE EVERY DAY, Disp: 90 capsule, Rfl: 3 .  potassium chloride SA (K-DUR,KLOR-CON) 20 MEQ tablet, Take 1 tablet (20 mEq total) by mouth daily., Disp: 90 tablet, Rfl: 3 .  pravastatin  (PRAVACHOL) 20 MG tablet, TAKE 1 TABLET EVERY DAY, Disp: 90 tablet, Rfl: 4 .  tamsulosin (FLOMAX) 0.4 MG CAPS capsule, Take 1 capsule by mouth daily., Disp: , Rfl:  .  Triamcinolone Acetonide (NASACORT AQ NA), Place into the nose., Disp: , Rfl:   Review of Systems  Constitutional: Negative for appetite change, chills, fatigue and fever.  HENT: Negative for congestion, ear pain, hearing loss, nosebleeds and trouble swallowing.   Eyes: Negative for pain and visual disturbance.  Respiratory: Negative for cough, chest tightness and shortness of breath.   Cardiovascular: Negative for chest pain, palpitations and leg swelling.  Gastrointestinal: Negative for abdominal pain, blood in stool, constipation, diarrhea, nausea and vomiting.  Endocrine: Negative for polydipsia, polyphagia and polyuria.  Genitourinary: Negative for dysuria and flank pain.  Musculoskeletal: Negative for arthralgias, back pain, joint swelling, myalgias and neck stiffness.  Skin: Negative for color change, rash and wound.  Neurological: Negative for dizziness, tremors, seizures, speech difficulty, weakness, light-headedness and headaches.  Psychiatric/Behavioral: Negative for behavioral problems, confusion, decreased concentration, dysphoric mood and sleep disturbance. The patient is not nervous/anxious.   All other systems reviewed and are negative.   Social History   Tobacco Use  . Smoking status: Never Smoker  . Smokeless tobacco: Former Systems developer    Types: Snuff, Chew  Substance Use Topics  . Alcohol use: No    Alcohol/week: 0.0 oz   Objective:   BP (!) 160/96 (BP Location: Right Arm, Patient Position: Sitting, Cuff Size: Large)  There were no vitals filed for this visit. Vitals: BP (!) 182/88 (BP Location: Left Arm)   Pulse 60   Temp 97.7 F (36.5 C) (Oral)   Ht 6' (1.829 m)   Wt 220 lb 12.8 oz (100.2 kg)   BMI 29.95 kg/m   Body mass index is 29.95 kg/m.  Physical Exam   General Appearance:    Alert,  cooperative, no distress  Eyes:    PERRL, conjunctiva/corneas clear, EOM's intact       Lungs:     Clear to auscultation bilaterally, respirations unlabored  Heart:    Regular rate and rhythm  Neurologic:   Awake, alert, oriented x 3. No apparent focal neurological           defect.       Diabetic Foot Exam - Simple   Simple Foot Form Diabetic Foot exam was performed with the following findings:  Yes 04/03/2017  9:55 AM  Visual Inspection No deformities, no ulcerations, no other skin breakdown bilaterally:  Yes Sensation Testing Intact to touch and monofilament testing bilaterally:  Yes Pulse Check Posterior Tibialis and Dorsalis pulse intact bilaterally:  Yes Comments       Results for orders placed or performed in visit on 04/03/17  POCT HgB A1C  Result Value Ref Range   Hemoglobin A1C 6.4    Est. average glucose Bld gHb Est-mCnc 137        Assessment & Plan:      1. Type 2 diabetes mellitus without complication, without long-term current use of insulin (HCC) Well  controlled.  Continue current medications.   - POCT HgB A1C  2. Essential (primary) hypertension Up today due to missing medications for the last two days. Is usually better controlled. Advised to check at home periodically and call if consistently over 140.   3. Hyperlipidemia, unspecified hyperlipidemia type He is tolerating pravastatin well with no adverse effects.   - Lipid panel - COMPLETE METABOLIC PANEL WITH GFR       Lelon Huh, MD  [facility]

## 2017-04-03 NOTE — Patient Instructions (Signed)
Adrian Thompson , Thank you for taking time to come for your Medicare Wellness Visit. I appreciate your ongoing commitment to your health goals. Please review the following plan we discussed and let me know if I can assist you in the future.   Screening recommendations/referrals: Colonoscopy: Up to date Recommended yearly ophthalmology/optometry visit for glaucoma screening and checkup Recommended yearly dental visit for hygiene and checkup  Vaccinations: Influenza vaccine: completed Pneumococcal vaccine: completed series Tdap vaccine: up to date Shingles vaccine: completed in 2009  Advanced directives: Please bring a copy of your POA (Power of [place]) and/or Living Will to your next appointment.   Conditions/risks identified: Recommend to start exercising three times a week for 30 minutes.   Next appointment: 9:00 AM today  Preventive Care 70 Years and Older, Male Preventive care refers to lifestyle choices and visits with your health care provider that can promote health and wellness. What does preventive care include?  A yearly physical exam. This is also called an annual well check.  Dental exams once or twice a year.  Routine eye exams. Ask your health care provider how often you should have your eyes checked.  Personal lifestyle choices, including:  Daily care of your teeth and gums.  Regular physical activity.  Eating a healthy diet.  Avoiding tobacco and drug use.  Limiting alcohol use.  Practicing safe sex.  Taking low doses of aspirin every day.  Taking vitamin and mineral supplements as recommended by your health care provider. What happens during an annual well check? The services and screenings done by your health care provider during your annual well check will depend on your age, overall health, lifestyle risk factors, and family history of disease. Counseling  Your health care provider may ask you questions about your:  Alcohol use.  Tobacco  use.  Drug use.  Emotional well-being.  Home and relationship well-being.  Sexual activity.  Eating habits.  History of falls.  Memory and ability to understand (cognition).  Work and work Statistician. Screening  You may have the following tests or measurements:  Height, weight, and BMI.  Blood pressure.  Lipid and cholesterol levels. These may be checked every 5 years, or more frequently if you are over 27 years old.  Skin check.  Lung cancer screening. You may have this screening every year starting at age 70 if you have a 30-pack-year history of smoking and currently smoke or have quit within the past 15 years.  Fecal occult blood test (FOBT) of the stool. You may have this test every year starting at age 70.  Flexible sigmoidoscopy or colonoscopy. You may have a sigmoidoscopy every 5 years or a colonoscopy every 10 years starting at age 4.  Prostate cancer screening. Recommendations will vary depending on your family history and other risks.  Hepatitis C blood test.  Hepatitis B blood test.  Sexually transmitted disease (STD) testing.  Diabetes screening. This is done by checking your blood sugar (glucose) after you have not eaten for a while (fasting). You may have this done every 1-3 years.  Abdominal aortic aneurysm (AAA) screening. You may need this if you are a current or former smoker.  Osteoporosis. You may be screened starting at age 70 if you are at high risk. Talk with your health care provider about your test results, treatment options, and if necessary, the need for more tests. Vaccines  Your health care provider may recommend certain vaccines, such as:  Influenza vaccine. This is recommended every year.  Tetanus, diphtheria, and acellular pertussis (Tdap, Td) vaccine. You may need a Td booster every 10 years.  Zoster vaccine. You may need this after age 70.  Pneumococcal 13-valent conjugate (PCV13) vaccine. One dose is recommended after age  70.  Pneumococcal polysaccharide (PPSV23) vaccine. One dose is recommended after age 70. Talk to your health care provider about which screenings and vaccines you need and how often you need them. This information is not intended to replace advice given to you by your health care provider. Make sure you discuss any questions you have with your health care provider. Document Released: 06/05/2015 Document Revised: 01/27/2016 Document Reviewed: 03/10/2015 Elsevier Interactive Patient Education  2017 [place] Prevention in the Home Falls can cause injuries. They can happen to people of all ages. There are many things you can do to make your home safe and to help prevent falls. What can I do on the outside of my home?  Regularly fix the edges of walkways and driveways and fix any cracks.  Remove anything that might make you trip as you walk through a door, such as a raised step or threshold.  Trim any bushes or trees on the path to your home.  Use bright outdoor lighting.  Clear any walking paths of anything that might make someone trip, such as rocks or tools.  Regularly check to see if handrails are loose or broken. Make sure that both sides of any steps have handrails.  Any raised decks and porches should have guardrails on the edges.  Have any leaves, snow, or ice cleared regularly.  Use sand or salt on walking paths during winter.  Clean up any spills in your garage right away. This includes oil or grease spills. What can I do in the bathroom?  Use night lights.  Install grab bars by the toilet and in the tub and shower. Do not use towel bars as grab bars.  Use non-skid mats or decals in the tub or shower.  If you need to sit down in the shower, use a plastic, non-slip stool.  Keep the floor dry. Clean up any water that spills on the floor as soon as it happens.  Remove soap buildup in the tub or shower regularly.  Attach bath mats securely with double-sided  non-slip rug tape.  Do not have throw rugs and other things on the floor that can make you trip. What can I do in the bedroom?  Use night lights.  Make sure that you have a light by your bed that is easy to reach.  Do not use any sheets or blankets that are too big for your bed. They should not hang down onto the floor.  Have a firm chair that has side arms. You can use this for support while you get dressed.  Do not have throw rugs and other things on the floor that can make you trip. What can I do in the kitchen?  Clean up any spills right away.  Avoid walking on wet floors.  Keep items that you use a lot in easy-to-reach places.  If you need to reach something above you, use a strong step stool that has a grab bar.  Keep electrical cords out of the way.  Do not use floor polish or wax that makes floors slippery. If you must use wax, use non-skid floor wax.  Do not have throw rugs and other things on the floor that can make you trip. What can I do  with my stairs?  Do not leave any items on the stairs.  Make sure that there are handrails on both sides of the stairs and use them. Fix handrails that are broken or loose. Make sure that handrails are as long as the stairways.  Check any carpeting to make sure that it is firmly attached to the stairs. Fix any carpet that is loose or worn.  Avoid having throw rugs at the top or bottom of the stairs. If you do have throw rugs, attach them to the floor with carpet tape.  Make sure that you have a light switch at the top of the stairs and the bottom of the stairs. If you do not have them, ask someone to add them for you. What else can I do to help prevent falls?  Wear shoes that:  Do not have high heels.  Have rubber bottoms.  Are comfortable and fit you well.  Are closed at the toe. Do not wear sandals.  If you use a stepladder:  Make sure that it is fully opened. Do not climb a closed stepladder.  Make sure that both  sides of the stepladder are locked into place.  Ask someone to hold it for you, if possible.  Clearly mark and make sure that you can see:  Any grab bars or handrails.  First and last steps.  Where the edge of each step is.  Use tools that help you move around (mobility aids) if they are needed. These include:  Canes.  Walkers.  Scooters.  Crutches.  Turn on the lights when you go into a dark area. Replace any light bulbs as soon as they burn out.  Set up your furniture so you have a clear path. Avoid moving your furniture around.  If any of your floors are uneven, fix them.  If there are any pets around you, be aware of where they are.  Review your medicines with your doctor. Some medicines can make you feel dizzy. This can increase your chance of falling. Ask your doctor what other things that you can do to help prevent falls. This information is not intended to replace advice given to you by your health care provider. Make sure you discuss any questions you have with your health care provider. Document Released: 03/05/2009 Document Revised: 10/15/2015 Document Reviewed: 06/13/2014 Elsevier Interactive Patient Education  2017 [employer].

## 2017-04-03 NOTE — Progress Notes (Signed)
Subjective:   Adrian Thompson is a 71 y.o. male who presents for Medicare Annual/Subsequent preventive examination.  Review of Systems:  N/A  Cardiac Risk Factors include: advanced age (>34men, >52 women);dyslipidemia;diabetes mellitus;hypertension;male gender     Objective:    Vitals: BP (!) 182/88 (BP Location: Left Arm)   Pulse 60   Temp 97.7 F (36.5 C) (Oral)   Ht 6' (1.829 m)   Wt 220 lb 12.8 oz (100.2 kg)   BMI 29.95 kg/m   Body mass index is 29.95 kg/m.  Tobacco Social History   Tobacco Use  Smoking Status Never Smoker  Smokeless Tobacco Former Systems developer  . Types: Snuff, Chew     Counseling given: Not Answered   Past Medical History:  Diagnosis Date  . Gout   . History of chicken pox   . History of measles   . History of mumps    Past Surgical History:  Procedure Laterality Date  . COCHLEAR IMPLANT Right 06/28/2016   Vira Blanco, MD [facility]  . TONSILLECTOMY    . UPPER GI ENDOSCOPY  2009  . UPPER GI ENDOSCOPY  03/04/2011   [facility]; Excision of multiple gastric polyps   Family History  Problem Relation Age of Onset  . Hypertension Mother   . Hypertension Sister    Social History   Substance and Sexual Activity  Sexual Activity Not on file    Outpatient Encounter Medications as of 04/03/2017  Medication Sig  . amLODipine (NORVASC) 5 MG tablet Take 1 tablet (5 mg total) by mouth daily.  Marland Kitchen aspirin 81 MG tablet Take 1 tablet by mouth daily.  Marland Kitchen atenolol (TENORMIN) 50 MG tablet TAKE 1 TABLET EVERY DAY  . finasteride (PROSCAR) 5 MG tablet Take 5 mg daily by mouth.  Marland Kitchen glucosamine-chondroitin 500-400 MG tablet Take 1 tablet by mouth daily.  Marland Kitchen glucose blood (ACCU-CHEK AVIVA PLUS) test strip Use as instructed to check blood sugar once daily. Dx type 2 diabetes. (E11.9)  . indomethacin (INDOCIN) 25 MG capsule Take 1 capsule by mouth. Up to 3 times a day  . losartan-hydrochlorothiazide (HYZAAR) 100-25 MG tablet TAKE 1 TABLET EVERY DAY  . Misc Natural Products  (BLACK CHERRY CONCENTRATE PO) Take 1 tablet by mouth daily.  . montelukast (SINGULAIR) 10 MG tablet TAKE 1 TABLET EVERY DAY  . Multiple Vitamins-Minerals (MULTIVITAMIN ADULT PO) Take 1 tablet by mouth daily.  . nortriptyline (PAMELOR) 25 MG capsule TAKE 1 CAPSULE EVERY DAY  . Omega-3 Fatty Acids (FISH OIL) 1000 MG CPDR Take 1 capsule by mouth daily.  Marland Kitchen omeprazole (PRILOSEC) 20 MG capsule TAKE 1 CAPSULE EVERY DAY  . potassium chloride SA (K-DUR,KLOR-CON) 20 MEQ tablet Take 1 tablet (20 mEq total) by mouth daily.  . pravastatin (PRAVACHOL) 20 MG tablet TAKE 1 TABLET EVERY DAY  . tamsulosin (FLOMAX) 0.4 MG CAPS capsule Take 1 capsule by mouth daily.  . Triamcinolone Acetonide (NASACORT AQ NA) Place into the nose.  . [DISCONTINUED] Misc Natural Products (BLACK CHERRY CONCENTRATE PO) Place under the tongue 2 (two) times daily.   No facility-administered encounter medications on file as of 04/03/2017.     Activities of Daily Living In your present state of health, do you have any difficulty performing the following activities: 04/03/2017  Hearing? Y  Comment wears a hearing aid and has a cochlear implant  Vision? N  Difficulty concentrating or making decisions? N  Walking or climbing stairs? N  Dressing or bathing? N  Doing errands, shopping? N  Preparing  Food and eating ? N  Using the Toilet? N  In the past six months, have you accidently leaked urine? N  Do you have problems with loss of bowel control? N  Managing your Medications? N  Managing your Finances? N  Housekeeping or managing your Housekeeping? N  Some recent data might be hidden    Patient Care Team: Birdie Sons, MD as PCP - General (Family Medicine) Murrell Redden, MD (Urology)   Assessment:     Exercise Activities and Dietary recommendations Current Exercise Habits: The patient does not participate in regular exercise at present, Exercise limited by: None identified  Goals    . Exercise 3x per week (30 min per  time)     Recommend to start exercising three times a week for 30 minutes.       Fall Risk Fall Risk  04/03/2017 04/01/2016 03/02/2015  Falls in the past year? No No No   Depression Screen PHQ 2/9 Scores 04/03/2017 04/01/2016 03/02/2015  PHQ - 2 Score 0 0 0  PHQ- 9 Score - - 1    Cognitive Function: Pt declined screening today.        Immunization History  Administered Date(s) Administered  . Influenza, High Dose Seasonal PF 03/02/2015, 03/07/2016  . Pneumococcal Conjugate-13 02/26/2014  . Pneumococcal Polysaccharide-23 02/25/2013  . Td 03/20/2001  . Tdap 02/14/2011  . Zoster 06/19/2007   Screening Tests Health Maintenance  Topic Date Due  . FOOT EXAM  03/01/2016  . HEMOGLOBIN A1C  04/05/2017  . OPHTHALMOLOGY EXAM  06/23/2017  . COLONOSCOPY  02/25/2018  . TETANUS/TDAP  02/13/2021  . INFLUENZA VACCINE  Completed  . PNA vac Low Risk Adult  Completed  . Hepatitis C Screening  Addressed      Plan:  I have personally reviewed and addressed the Medicare Annual Wellness questionnaire and have noted the following in the patient's chart:  A. Medical and social history B. Use of alcohol, tobacco or illicit drugs  C. Current medications and supplements D. Functional ability and status E.  Nutritional status F.  Physical activity G. Advance directives H. List of other physicians I.  Hospitalizations, surgeries, and ER visits in previous 12 months J.  [place] such as hearing and vision if needed, cognitive and depression L. Referrals and appointments - none  In addition, I have reviewed and discussed with patient certain preventive protocols, quality metrics, and best practice recommendations. A written personalized care plan for preventive services as well as general preventive health recommendations were provided to patient.  See attached scanned questionnaire for additional information.   Signed,  Fabio Neighbors, LPN Nurse Health Advisor   MD  Recommendations: Pt needs a diabetic foot exam completed today.

## 2017-04-04 ENCOUNTER — Encounter: Payer: Self-pay | Admitting: Family Medicine

## 2017-04-11 DIAGNOSIS — H906 Mixed conductive and sensorineural hearing loss, bilateral: Secondary | ICD-10-CM | POA: Diagnosis not present

## 2017-04-11 DIAGNOSIS — Z9621 Cochlear implant status: Secondary | ICD-10-CM | POA: Diagnosis not present

## 2017-04-25 ENCOUNTER — Other Ambulatory Visit: Payer: Self-pay | Admitting: Family Medicine

## 2017-04-25 NOTE — Telephone Encounter (Signed)
Pharmacy requesting refills. Thanks!  

## 2017-04-26 ENCOUNTER — Other Ambulatory Visit: Payer: Self-pay | Admitting: Family Medicine

## 2017-04-26 DIAGNOSIS — E876 Hypokalemia: Secondary | ICD-10-CM

## 2017-04-27 DIAGNOSIS — H903 Sensorineural hearing loss, bilateral: Secondary | ICD-10-CM | POA: Diagnosis not present

## 2017-05-05 DIAGNOSIS — H903 Sensorineural hearing loss, bilateral: Secondary | ICD-10-CM | POA: Diagnosis not present

## 2017-05-08 DIAGNOSIS — H903 Sensorineural hearing loss, bilateral: Secondary | ICD-10-CM | POA: Diagnosis not present

## 2017-05-10 DIAGNOSIS — H903 Sensorineural hearing loss, bilateral: Secondary | ICD-10-CM | POA: Diagnosis not present

## 2017-05-26 DIAGNOSIS — R972 Elevated prostate specific antigen [PSA]: Secondary | ICD-10-CM | POA: Diagnosis not present

## 2017-06-26 ENCOUNTER — Other Ambulatory Visit: Payer: Self-pay | Admitting: Family Medicine

## 2017-06-26 DIAGNOSIS — I10 Essential (primary) hypertension: Secondary | ICD-10-CM

## 2017-06-29 DIAGNOSIS — H903 Sensorineural hearing loss, bilateral: Secondary | ICD-10-CM | POA: Diagnosis not present

## 2017-07-03 ENCOUNTER — Ambulatory Visit: Payer: Medicare Other | Admitting: Family Medicine

## 2017-07-11 DIAGNOSIS — H903 Sensorineural hearing loss, bilateral: Secondary | ICD-10-CM | POA: Diagnosis not present

## 2017-07-11 DIAGNOSIS — Z9621 Cochlear implant status: Secondary | ICD-10-CM | POA: Diagnosis not present

## 2017-07-27 DIAGNOSIS — H903 Sensorineural hearing loss, bilateral: Secondary | ICD-10-CM | POA: Diagnosis not present

## 2017-08-24 DIAGNOSIS — E119 Type 2 diabetes mellitus without complications: Secondary | ICD-10-CM | POA: Diagnosis not present

## 2017-08-24 DIAGNOSIS — R339 Retention of urine, unspecified: Secondary | ICD-10-CM | POA: Diagnosis not present

## 2017-08-24 DIAGNOSIS — Z8249 Family history of ischemic heart disease and other diseases of the circulatory system: Secondary | ICD-10-CM | POA: Diagnosis not present

## 2017-08-24 DIAGNOSIS — Z79899 Other long term (current) drug therapy: Secondary | ICD-10-CM | POA: Diagnosis not present

## 2017-08-24 DIAGNOSIS — K219 Gastro-esophageal reflux disease without esophagitis: Secondary | ICD-10-CM | POA: Diagnosis not present

## 2017-08-24 DIAGNOSIS — N400 Enlarged prostate without lower urinary tract symptoms: Secondary | ICD-10-CM | POA: Diagnosis not present

## 2017-08-24 DIAGNOSIS — Z791 Long term (current) use of non-steroidal anti-inflammatories (NSAID): Secondary | ICD-10-CM | POA: Diagnosis not present

## 2017-08-24 DIAGNOSIS — N411 Chronic prostatitis: Secondary | ICD-10-CM | POA: Diagnosis not present

## 2017-08-24 DIAGNOSIS — I10 Essential (primary) hypertension: Secondary | ICD-10-CM | POA: Diagnosis not present

## 2017-08-24 DIAGNOSIS — Z7982 Long term (current) use of aspirin: Secondary | ICD-10-CM | POA: Diagnosis not present

## 2017-08-24 DIAGNOSIS — N403 Nodular prostate with lower urinary tract symptoms: Secondary | ICD-10-CM | POA: Diagnosis not present

## 2017-08-24 DIAGNOSIS — N138 Other obstructive and reflux uropathy: Secondary | ICD-10-CM | POA: Diagnosis not present

## 2017-08-24 DIAGNOSIS — Z7951 Long term (current) use of inhaled steroids: Secondary | ICD-10-CM | POA: Diagnosis not present

## 2017-08-24 DIAGNOSIS — Z6828 Body mass index (BMI) 28.0-28.9, adult: Secondary | ICD-10-CM | POA: Diagnosis not present

## 2017-08-24 DIAGNOSIS — R972 Elevated prostate specific antigen [PSA]: Secondary | ICD-10-CM | POA: Diagnosis not present

## 2017-09-17 ENCOUNTER — Other Ambulatory Visit: Payer: Self-pay | Admitting: Family Medicine

## 2017-09-20 DIAGNOSIS — H903 Sensorineural hearing loss, bilateral: Secondary | ICD-10-CM | POA: Diagnosis not present

## 2017-09-29 ENCOUNTER — Ambulatory Visit (INDEPENDENT_AMBULATORY_CARE_PROVIDER_SITE_OTHER): Payer: Medicare Other | Admitting: Family Medicine

## 2017-09-29 ENCOUNTER — Encounter: Payer: Self-pay | Admitting: Family Medicine

## 2017-09-29 VITALS — BP 150/78 | HR 53 | Temp 97.9°F | Resp 16 | Ht 72.0 in | Wt 217.0 lb

## 2017-09-29 DIAGNOSIS — E119 Type 2 diabetes mellitus without complications: Secondary | ICD-10-CM

## 2017-09-29 DIAGNOSIS — I10 Essential (primary) hypertension: Secondary | ICD-10-CM

## 2017-09-29 LAB — POCT GLYCOSYLATED HEMOGLOBIN (HGB A1C)
ESTIMATED AVERAGE GLUCOSE: 143
HEMOGLOBIN A1C: 6.6

## 2017-09-29 MED ORDER — AMLODIPINE BESYLATE 5 MG PO TABS: 10.0000 mg | ORAL_TABLET | Freq: Every day | ORAL | 0 refills | Status: DC

## 2017-09-29 NOTE — Patient Instructions (Signed)
   Increase amlodipine to 2 (two)   5mg  tablets every day until the new prescription for 10mg  tablets arrives in the mail

## 2017-09-29 NOTE — Progress Notes (Signed)
Patient: Adrian Thompson Male    DOB: 1945/05/24   72 y.o.   MRN: [id number] Visit Date: 09/29/2017  Today's Provider: Lelon Huh, MD   Chief Complaint  Patient presents with  . Follow-up  . Diabetes  . Hypertension  . Hyperlipidemia   Subjective:    HPI   Diabetes Mellitus Type II, Follow-up:   Lab Results  Component Value Date   HGBA1C 6.4 04/03/2017   HGBA1C 6.3 10/03/2016   HGBA1C 6.5 (H) 04/01/2016   Last seen for diabetes 6 months ago.  Management since then includes; no changes. He reports good compliance with treatment. He is not having side effects. none Current symptoms include none and have been unchanged. Home blood sugar records: fasting range: 120  Episodes of hypoglycemia? no   Current Insulin Regimen: n/a Most Recent Eye Exam: 06/23/2016 Weight trend: stable Prior visit with dietician: no Current diet: well balanced Current exercise: none  ----------------------------------------------------------------   Hypertension, follow-up:  BP Readings from Last 3 Encounters:  09/29/17 (!) 150/78  04/03/17 (!) 160/96  04/03/17 (!) 170/100    He was last seen for hypertension 6 months ago.  BP at that visit was 160/96. Management since that visit includes; no changes.He reports good compliance with treatment. He is not having side effects. none He is not exercising. He is adherent to low salt diet.   Outside blood pressures are 145/85. He is experiencing none.  Patient denies none.   Cardiovascular risk factors include diabetes mellitus.  Use of agents associated with hypertension: none.   ----------------------------------------------------------------    Lipid/Cholesterol, Follow-up:   Last seen for this 6 months ago.  Management since that visit includes; no changes.  Last Lipid Panel:    Component Value Date/Time   CHOL 156 04/03/2017 1030   CHOL 150 04/01/2016 1116   TRIG 86 04/03/2017 1030   HDL 67 04/03/2017 1030   HDL  57 04/01/2016 1116   CHOLHDL 2.3 04/03/2017 1030   LDLCALC 72 04/03/2017 1030    He reports good compliance with treatment. He is not having side effects. none  Wt Readings from Last 3 Encounters:  09/29/17 217 lb (98.4 kg)  04/03/17 220 lb 12.8 oz (100.2 kg)  12/12/16 222 lb (100.7 kg)    ----------------------------------------------------------------    No Known Allergies   Current Outpatient Medications:  .  amLODipine (NORVASC) 5 MG tablet, TAKE 1 TABLET EVERY DAY, Disp: 90 tablet, Rfl: 4 .  aspirin 81 MG tablet, Take 1 tablet by mouth daily., Disp: , Rfl:  .  atenolol (TENORMIN) 50 MG tablet, TAKE 1 TABLET EVERY DAY, Disp: 90 tablet, Rfl: 3 .  finasteride (PROSCAR) 5 MG tablet, Take 5 mg daily by mouth., Disp: , Rfl:  .  glucosamine-chondroitin 500-400 MG tablet, Take 1 tablet by mouth daily., Disp: , Rfl:  .  glucose blood (ACCU-CHEK AVIVA PLUS) test strip, Use as instructed to check blood sugar once daily. Dx type 2 diabetes. (E11.9), Disp: 100 each, Rfl: 4 .  indomethacin (INDOCIN) 25 MG capsule, Take 1 capsule by mouth. Up to 3 times a day, Disp: , Rfl:  .  KLOR-CON M20 20 MEQ tablet, TAKE 1 TABLET EVERY DAY, Disp: 90 tablet, Rfl: 3 .  losartan-hydrochlorothiazide (HYZAAR) 100-25 MG tablet, TAKE 1 TABLET EVERY DAY, Disp: 90 tablet, Rfl: 3 .  Misc Natural Products (BLACK CHERRY CONCENTRATE PO), Take 1 tablet by mouth daily., Disp: , Rfl:  .  montelukast (SINGULAIR) 10 MG tablet,  TAKE 1 TABLET EVERY DAY, Disp: 90 tablet, Rfl: 4 .  Multiple Vitamins-Minerals (MULTIVITAMIN ADULT PO), Take 1 tablet by mouth daily., Disp: , Rfl:  .  nortriptyline (PAMELOR) 25 MG capsule, TAKE 1 CAPSULE EVERY DAY, Disp: 90 capsule, Rfl: 4 .  Omega-3 Fatty Acids (FISH OIL) 1000 MG CPDR, Take 1 capsule by mouth daily., Disp: , Rfl:  .  omeprazole (PRILOSEC) 20 MG capsule, TAKE 1 CAPSULE EVERY DAY, Disp: 90 capsule, Rfl: 4 .  pravastatin (PRAVACHOL) 20 MG tablet, TAKE 1 TABLET EVERY DAY, Disp: 90  tablet, Rfl: 4 .  tamsulosin (FLOMAX) 0.4 MG CAPS capsule, Take 1 capsule by mouth daily., Disp: , Rfl:  .  Triamcinolone Acetonide (NASACORT AQ NA), Place into the nose., Disp: , Rfl:   Review of Systems  Constitutional: Negative for appetite change, chills and fever.  Respiratory: Negative for chest tightness, shortness of breath and wheezing.   Cardiovascular: Negative for chest pain and palpitations.  Gastrointestinal: Negative for abdominal pain, nausea and vomiting.    Social History   Tobacco Use  . Smoking status: Never Smoker  . Smokeless tobacco: Former Systems developer    Types: Snuff, Chew  Substance Use Topics  . Alcohol use: No    Alcohol/week: 0.0 oz   Objective:   BP (!) 150/78 (BP Location: Right Arm, Patient Position: Sitting, Cuff Size: Large)   Pulse (!) 53   Temp 97.9 F (36.6 C) (Oral)   Resp 16   Ht 6' (1.829 m)   Wt 217 lb (98.4 kg)   SpO2 97%   BMI 29.43 kg/m  Vitals:   09/29/17 0932  BP: (!) 150/78  Pulse: (!) 53  Resp: 16  Temp: 97.9 F (36.6 C)  TempSrc: Oral  SpO2: 97%  Weight: 217 lb (98.4 kg)  Height: 6' (1.829 m)     Physical Exam  General appearance: alert, well developed, well nourished, cooperative and in no distress Head: Normocephalic, without obvious abnormality, atraumatic Respiratory: Respirations even and unlabored, normal respiratory rate Extremities: No gross deformities  Results for orders placed or performed in visit on 09/29/17  POCT glycosylated hemoglobin (Hb A1C)  Result Value Ref Range   Hemoglobin A1C 6.6    Est. average glucose Bld gHb Est-mCnc 143         Assessment & Plan:     1. Type 2 diabetes mellitus without complication, without long-term current use of insulin (HCC) Well controlled.  Continue current medications.   - POCT glycosylated hemoglobin (Hb A1C)  2. Essential (primary) hypertension Double dose of- amLODipine (NORVASC) 5 MG tablet; Take 2 tablets (10 mg total) by mouth daily.  Dispense: 3  tablet; Refill: 0  Will send in 90 prescription for 10mg  tablets to [facility] at the end of May  Follow up 3 months.        Lelon Huh, MD  [facility]

## 2017-10-10 ENCOUNTER — Encounter: Payer: Self-pay | Admitting: Family Medicine

## 2017-10-13 ENCOUNTER — Encounter: Payer: Self-pay | Admitting: Family Medicine

## 2017-12-07 ENCOUNTER — Encounter: Payer: Self-pay | Admitting: Family Medicine

## 2017-12-07 DIAGNOSIS — I10 Essential (primary) hypertension: Secondary | ICD-10-CM

## 2017-12-08 MED ORDER — AMLODIPINE BESYLATE 10 MG PO TABS: 10.0000 mg | ORAL_TABLET | Freq: Every day | ORAL | 3 refills | Status: DC

## 2017-12-11 DIAGNOSIS — H903 Sensorineural hearing loss, bilateral: Secondary | ICD-10-CM | POA: Diagnosis not present

## 2018-01-01 ENCOUNTER — Ambulatory Visit: Payer: Self-pay | Admitting: Family Medicine

## 2018-01-01 NOTE — Progress Notes (Deleted)
       Patient: Adrian Thompson Male    DOB: [birth year]   72 y.o.   MRN: [id number] Visit Date: 01/01/2018  Today's Provider: Lelon Huh, MD   No chief complaint on file.  Subjective:    HPI     No Known Allergies   Current Outpatient Medications:  .  amLODipine (NORVASC) 10 MG tablet, Take 1 tablet (10 mg total) by mouth daily., Disp: 90 tablet, Rfl: 3 .  aspirin 81 MG tablet, Take 1 tablet by mouth daily., Disp: , Rfl:  .  atenolol (TENORMIN) 50 MG tablet, TAKE 1 TABLET EVERY DAY, Disp: 90 tablet, Rfl: 3 .  finasteride (PROSCAR) 5 MG tablet, Take 5 mg daily by mouth., Disp: , Rfl:  .  glucosamine-chondroitin 500-400 MG tablet, Take 1 tablet by mouth daily., Disp: , Rfl:  .  glucose blood (ACCU-CHEK AVIVA PLUS) test strip, Use as instructed to check blood sugar once daily. Dx type 2 diabetes. (E11.9), Disp: 100 each, Rfl: 4 .  indomethacin (INDOCIN) 25 MG capsule, Take 1 capsule by mouth. Up to 3 times a day, Disp: , Rfl:  .  KLOR-CON M20 20 MEQ tablet, TAKE 1 TABLET EVERY DAY, Disp: 90 tablet, Rfl: 3 .  losartan-hydrochlorothiazide (HYZAAR) 100-25 MG tablet, TAKE 1 TABLET EVERY DAY, Disp: 90 tablet, Rfl: 3 .  Misc Natural Products (BLACK CHERRY CONCENTRATE PO), Take 1 tablet by mouth daily., Disp: , Rfl:  .  montelukast (SINGULAIR) 10 MG tablet, TAKE 1 TABLET EVERY DAY, Disp: 90 tablet, Rfl: 4 .  Multiple Vitamins-Minerals (MULTIVITAMIN ADULT PO), Take 1 tablet by mouth daily., Disp: , Rfl:  .  nortriptyline (PAMELOR) 25 MG capsule, TAKE 1 CAPSULE EVERY DAY, Disp: 90 capsule, Rfl: 4 .  Omega-3 Fatty Acids (FISH OIL) 1000 MG CPDR, Take 1 capsule by mouth daily., Disp: , Rfl:  .  omeprazole (PRILOSEC) 20 MG capsule, TAKE 1 CAPSULE EVERY DAY, Disp: 90 capsule, Rfl: 4 .  pravastatin (PRAVACHOL) 20 MG tablet, TAKE 1 TABLET EVERY DAY, Disp: 90 tablet, Rfl: 4 .  tamsulosin (FLOMAX) 0.4 MG CAPS capsule, Take 1 capsule by mouth daily., Disp: , Rfl:  .  Triamcinolone Acetonide (NASACORT  AQ NA), Place into the nose., Disp: , Rfl:   Review of Systems  Constitutional: Negative for appetite change, chills and fever.  Respiratory: Negative for chest tightness, shortness of breath and wheezing.   Cardiovascular: Negative for chest pain and palpitations.  Gastrointestinal: Negative for abdominal pain, nausea and vomiting.    Social History   Tobacco Use  . Smoking status: Never Smoker  . Smokeless tobacco: Former Systems developer    Types: Snuff, Chew  Substance Use Topics  . Alcohol use: No    Alcohol/week: 0.0 standard drinks   Objective:   There were no vitals taken for this visit. There were no vitals filed for this visit.   Physical Exam      Assessment & Plan:           Lelon Huh, MD  [facility]

## 2018-01-03 ENCOUNTER — Ambulatory Visit: Payer: Medicare Other | Admitting: Family Medicine

## 2018-01-04 NOTE — Progress Notes (Signed)
Patient: Adrian Thompson Male    DOB: [birth year]   72 y.o.   MRN: [id number] Visit Date: 01/08/2018  Today's Provider: Lelon Huh, MD   Chief Complaint  Patient presents with  . Follow-up  . Diabetes  . Hypertension  . Hyperlipidemia   Subjective:    HPI   Diabetes Mellitus Type II, Follow-up:   Lab Results  Component Value Date   HGBA1C 6.6 09/29/2017   HGBA1C 6.4 04/03/2017   HGBA1C 6.3 10/03/2016   Last seen for diabetes 3 months ago.  Management since then includes; no changes. He reports good compliance with treatment. He is not having side effects. none Current symptoms include none and have been unchanged. Home blood sugar records: fasting range: 134  Episodes of hypoglycemia? no   Current Insulin Regimen: n/a Most Recent Eye Exam: 06/23/2016 Weight trend: stable Prior visit with dietician: no Current diet: well balanced Current exercise: none  ---------------------------------------------------------------   Hypertension, follow-up:  BP Readings from Last 3 Encounters:  01/08/18 (!) 141/77  09/29/17 (!) 150/78  04/03/17 (!) 160/96    He was last seen for hypertension 3 months ago.  BP at that visit was 150/78. Management since that visit includes; advised to double dose of amlodipine to 10 mg qd.He reports good compliance with treatment. He is not having side effects. none He is not exercising. He is adherent to low salt diet.   Outside blood pressures are 124/75. He is experiencing none.  Patient denies none.   Cardiovascular risk factors include diabetes mellitus.  Use of agents associated with hypertension: none.   ---------------------------------------------------------------    Lipid/Cholesterol, Follow-up:   Last seen for this 8 months ago.  Management since that visit includes; no changes.  Last Lipid Panel:    Component Value Date/Time   CHOL 156 04/03/2017 1030   CHOL 150 04/01/2016 1116   TRIG 86 04/03/2017 1030     HDL 67 04/03/2017 1030   HDL 57 04/01/2016 1116   CHOLHDL 2.3 04/03/2017 1030   LDLCALC 72 04/03/2017 1030    He reports good compliance with treatment. He is not having side effects. none  Wt Readings from Last 3 Encounters:  01/08/18 215 lb (97.5 kg)  09/29/17 217 lb (98.4 kg)  04/03/17 220 lb 12.8 oz (100.2 kg)    ---------------------------------------------------------------    No Known Allergies   Current Outpatient Medications:  .  amLODipine (NORVASC) 10 MG tablet, Take 1 tablet (10 mg total) by mouth daily., Disp: 90 tablet, Rfl: 3 .  aspirin 81 MG tablet, Take 1 tablet by mouth daily., Disp: , Rfl:  .  atenolol (TENORMIN) 50 MG tablet, TAKE 1 TABLET EVERY DAY, Disp: 90 tablet, Rfl: 3 .  finasteride (PROSCAR) 5 MG tablet, Take 5 mg daily by mouth., Disp: , Rfl:  .  glucosamine-chondroitin 500-400 MG tablet, Take 1 tablet by mouth daily., Disp: , Rfl:  .  glucose blood (ACCU-CHEK AVIVA PLUS) test strip, Use as instructed to check blood sugar once daily. Dx type 2 diabetes. (E11.9), Disp: 100 each, Rfl: 4 .  indomethacin (INDOCIN) 25 MG capsule, Take 1 capsule by mouth. Up to 3 times a day, Disp: , Rfl:  .  KLOR-CON M20 20 MEQ tablet, TAKE 1 TABLET EVERY DAY, Disp: 90 tablet, Rfl: 3 .  losartan-hydrochlorothiazide (HYZAAR) 100-25 MG tablet, TAKE 1 TABLET EVERY DAY, Disp: 90 tablet, Rfl: 3 .  Misc Natural Products (BLACK CHERRY CONCENTRATE PO), Take 1 tablet by mouth  daily., Disp: , Rfl:  .  montelukast (SINGULAIR) 10 MG tablet, TAKE 1 TABLET EVERY DAY, Disp: 90 tablet, Rfl: 4 .  Multiple Vitamins-Minerals (MULTIVITAMIN ADULT PO), Take 1 tablet by mouth daily., Disp: , Rfl:  .  nortriptyline (PAMELOR) 25 MG capsule, TAKE 1 CAPSULE EVERY DAY, Disp: 90 capsule, Rfl: 4 .  Omega-3 Fatty Acids (FISH OIL) 1000 MG CPDR, Take 1 capsule by mouth daily., Disp: , Rfl:  .  omeprazole (PRILOSEC) 20 MG capsule, TAKE 1 CAPSULE EVERY DAY, Disp: 90 capsule, Rfl: 4 .  pravastatin  (PRAVACHOL) 20 MG tablet, TAKE 1 TABLET EVERY DAY, Disp: 90 tablet, Rfl: 4 .  tamsulosin (FLOMAX) 0.4 MG CAPS capsule, Take 1 capsule by mouth daily., Disp: , Rfl:  .  Triamcinolone Acetonide (NASACORT AQ NA), Place into the nose., Disp: , Rfl:   Review of Systems  Constitutional: Negative for appetite change, chills and fever.  Respiratory: Negative for chest tightness, shortness of breath and wheezing.   Cardiovascular: Negative for chest pain and palpitations.  Gastrointestinal: Negative for abdominal pain, nausea and vomiting.    Social History   Tobacco Use  . Smoking status: Never Smoker  . Smokeless tobacco: Former Systems developer    Types: Snuff, Chew  Substance Use Topics  . Alcohol use: No    Alcohol/week: 0.0 standard drinks   Objective:   BP (!) 141/77 (BP Location: Right Arm, Patient Position: Sitting, Cuff Size: Large)   Pulse (!) 55   Temp 98 F (36.7 C) (Oral)   Resp 16   Ht 6' (1.829 m)   Wt 215 lb (97.5 kg)   SpO2 96%   BMI 29.16 kg/m  Vitals:   01/08/18 0902  BP: (!) 141/77  Pulse: (!) 55  Resp: 16  Temp: 98 F (36.7 C)  TempSrc: Oral  SpO2: 96%  Weight: 215 lb (97.5 kg)  Height: 6' (1.829 m)     Physical Exam   General Appearance:    Alert, cooperative, no distress  Eyes:    PERRL, conjunctiva/corneas clear, EOM's intact       Lungs:     Clear to auscultation bilaterally, respirations unlabored  Heart:    Regular rate and rhythm  Neurologic:   Awake, alert, oriented x 3. No apparent focal neurological           defect.       Results for orders placed or performed in visit on 01/08/18  POCT glycosylated hemoglobin (Hb A1C)  Result Value Ref Range   Hemoglobin A1C 6.4 (A) 4.0 - 5.6 %   HbA1c POC (<> result, manual entry)     HbA1c, POC (prediabetic range)     HbA1c, POC (controlled diabetic range)     Est. average glucose Bld gHb Est-mCnc 137   POCT UA - Microalbumin  Result Value Ref Range   Microalbumin Ur, POC 50 mg/L       Assessment &  Plan:     1. Type 2 diabetes mellitus without complication, without long-term current use of insulin (HCC) Well controlled.  Continue current medications.   - POCT glycosylated hemoglobin (Hb A1C) - POCT UA - Microalbumin  2. Essential (primary) hypertension Well controlled.  Continue current medications.    Follow up in November for chronic conditions and AWV.         Lelon Huh, MD  [facility]

## 2018-01-08 ENCOUNTER — Encounter: Payer: Self-pay | Admitting: Family Medicine

## 2018-01-08 ENCOUNTER — Ambulatory Visit (INDEPENDENT_AMBULATORY_CARE_PROVIDER_SITE_OTHER): Payer: Medicare Other | Admitting: Family Medicine

## 2018-01-08 VITALS — BP 141/77 | HR 55 | Temp 98.0°F | Resp 16 | Ht 72.0 in | Wt 215.0 lb

## 2018-01-08 DIAGNOSIS — I10 Essential (primary) hypertension: Secondary | ICD-10-CM | POA: Diagnosis not present

## 2018-01-08 DIAGNOSIS — E119 Type 2 diabetes mellitus without complications: Secondary | ICD-10-CM

## 2018-01-08 LAB — POCT UA - MICROALBUMIN: MICROALBUMIN (UR) POC: 50 mg/L

## 2018-01-08 LAB — POCT GLYCOSYLATED HEMOGLOBIN (HGB A1C)
Est. average glucose Bld gHb Est-mCnc: 137
Hemoglobin A1C: 6.4 % — AB (ref 4.0–5.6)

## 2018-01-08 MED ORDER — INDOMETHACIN 25 MG PO CAPS
25.0000 mg | ORAL_CAPSULE | Freq: Three times a day (TID) | ORAL | 0 refills | Status: DC | PRN
Start: 2018-01-08 — End: 2021-11-02

## 2018-01-09 DIAGNOSIS — H903 Sensorineural hearing loss, bilateral: Secondary | ICD-10-CM | POA: Diagnosis not present

## 2018-01-09 DIAGNOSIS — Z45321 Encounter for adjustment and management of cochlear device: Secondary | ICD-10-CM | POA: Diagnosis not present

## 2018-01-10 DIAGNOSIS — D2262 Melanocytic nevi of left upper limb, including shoulder: Secondary | ICD-10-CM | POA: Diagnosis not present

## 2018-01-10 DIAGNOSIS — D2272 Melanocytic nevi of left lower limb, including hip: Secondary | ICD-10-CM | POA: Diagnosis not present

## 2018-01-10 DIAGNOSIS — D2271 Melanocytic nevi of right lower limb, including hip: Secondary | ICD-10-CM | POA: Diagnosis not present

## 2018-01-10 DIAGNOSIS — D2261 Melanocytic nevi of right upper limb, including shoulder: Secondary | ICD-10-CM | POA: Diagnosis not present

## 2018-01-10 DIAGNOSIS — S80862A Insect bite (nonvenomous), left lower leg, initial encounter: Secondary | ICD-10-CM | POA: Diagnosis not present

## 2018-01-10 DIAGNOSIS — L821 Other seborrheic keratosis: Secondary | ICD-10-CM | POA: Diagnosis not present

## 2018-01-10 DIAGNOSIS — D225 Melanocytic nevi of trunk: Secondary | ICD-10-CM | POA: Diagnosis not present

## 2018-01-27 ENCOUNTER — Other Ambulatory Visit: Payer: Self-pay | Admitting: Family Medicine

## 2018-02-22 DIAGNOSIS — H903 Sensorineural hearing loss, bilateral: Secondary | ICD-10-CM | POA: Diagnosis not present

## 2018-02-23 DIAGNOSIS — Z23 Encounter for immunization: Secondary | ICD-10-CM | POA: Diagnosis not present

## 2018-02-26 DIAGNOSIS — H903 Sensorineural hearing loss, bilateral: Secondary | ICD-10-CM | POA: Diagnosis not present

## 2018-02-27 DIAGNOSIS — H903 Sensorineural hearing loss, bilateral: Secondary | ICD-10-CM | POA: Diagnosis not present

## 2018-03-05 DIAGNOSIS — H903 Sensorineural hearing loss, bilateral: Secondary | ICD-10-CM | POA: Diagnosis not present

## 2018-03-06 DIAGNOSIS — H903 Sensorineural hearing loss, bilateral: Secondary | ICD-10-CM | POA: Diagnosis not present

## 2018-03-19 DIAGNOSIS — Z85828 Personal history of other malignant neoplasm of skin: Secondary | ICD-10-CM | POA: Diagnosis not present

## 2018-03-19 DIAGNOSIS — K219 Gastro-esophageal reflux disease without esophagitis: Secondary | ICD-10-CM | POA: Diagnosis not present

## 2018-03-19 DIAGNOSIS — N400 Enlarged prostate without lower urinary tract symptoms: Secondary | ICD-10-CM | POA: Diagnosis not present

## 2018-03-19 DIAGNOSIS — N401 Enlarged prostate with lower urinary tract symptoms: Secondary | ICD-10-CM | POA: Diagnosis not present

## 2018-03-19 DIAGNOSIS — R351 Nocturia: Secondary | ICD-10-CM | POA: Diagnosis not present

## 2018-03-19 DIAGNOSIS — E119 Type 2 diabetes mellitus without complications: Secondary | ICD-10-CM | POA: Diagnosis not present

## 2018-03-19 DIAGNOSIS — I10 Essential (primary) hypertension: Secondary | ICD-10-CM | POA: Diagnosis not present

## 2018-03-19 DIAGNOSIS — R972 Elevated prostate specific antigen [PSA]: Secondary | ICD-10-CM | POA: Diagnosis not present

## 2018-03-20 DIAGNOSIS — N401 Enlarged prostate with lower urinary tract symptoms: Secondary | ICD-10-CM | POA: Diagnosis not present

## 2018-03-20 DIAGNOSIS — R972 Elevated prostate specific antigen [PSA]: Secondary | ICD-10-CM | POA: Diagnosis not present

## 2018-03-20 DIAGNOSIS — L309 Dermatitis, unspecified: Secondary | ICD-10-CM | POA: Diagnosis not present

## 2018-03-27 ENCOUNTER — Encounter: Payer: Self-pay | Admitting: General Surgery

## 2018-03-27 ENCOUNTER — Ambulatory Visit (INDEPENDENT_AMBULATORY_CARE_PROVIDER_SITE_OTHER): Payer: Medicare Other | Admitting: General Surgery

## 2018-03-27 ENCOUNTER — Other Ambulatory Visit: Payer: Self-pay

## 2018-03-27 VITALS — BP 144/74 | HR 74 | Resp 14 | Ht 72.0 in | Wt 218.0 lb

## 2018-03-27 DIAGNOSIS — Z1211 Encounter for screening for malignant neoplasm of colon: Secondary | ICD-10-CM

## 2018-03-27 MED ORDER — POLYETHYLENE GLYCOL 3350 17 GM/SCOOP PO POWD: 1.0000 | Freq: Once | ORAL | 0 refills | Status: AC

## 2018-03-27 NOTE — Patient Instructions (Addendum)
Colonoscopy, Adult A colonoscopy is an exam to look at the entire large intestine. During the exam, a lubricated, bendable tube is inserted into the anus and then passed into the rectum, colon, and other parts of the large intestine. A colonoscopy is often done as a part of normal colorectal screening or in response to certain symptoms, such as anemia, persistent diarrhea, abdominal pain, and blood in the stool. The exam can help screen for and diagnose medical problems, including:  Tumors.  Polyps.  Inflammation.  Areas of bleeding.  Tell a health care provider about:  Any allergies you have.  All medicines you are taking, including vitamins, herbs, eye drops, creams, and over-the-counter medicines.  Any problems you or family members have had with anesthetic medicines.  Any blood disorders you have.  Any surgeries you have had.  Any medical conditions you have.  Any problems you have had passing stool. What are the risks? Generally, this is a safe procedure. However, problems may occur, including:  Bleeding.  A tear in the intestine.  A reaction to medicines given during the exam.  Infection (rare).  What happens before the procedure? Eating and drinking restrictions Follow instructions from your health care provider about eating and drinking, which may include:  A few days before the procedure - follow a low-fiber diet. Avoid nuts, seeds, dried fruit, raw fruits, and vegetables.  1-3 days before the procedure - follow a clear liquid diet. Drink only clear liquids, such as clear broth or bouillon, black coffee or tea, clear juice, clear soft drinks or sports drinks, gelatin dessert, and popsicles. Avoid any liquids that contain red or purple dye.  On the day of the procedure - do not eat or drink anything during the 2 hours before the procedure, or within the time period that your health care provider recommends.  Bowel prep If you were prescribed an oral bowel prep  to clean out your colon:  Take it as told by your health care provider. Starting the day before your procedure, you will need to drink a large amount of medicated liquid. The liquid will cause you to have multiple loose stools until your stool is almost clear or light green.  If your skin or anus gets irritated from diarrhea, you may use these to relieve the irritation: ? Medicated wipes, such as adult wet wipes with aloe and vitamin E. ? A skin soothing-product like petroleum jelly.  If you vomit while drinking the bowel prep, take a break for up to 60 minutes and then begin the bowel prep again. If vomiting continues and you cannot take the bowel prep without vomiting, call your health care provider.  General instructions  Ask your health care provider about changing or stopping your regular medicines. This is especially important if you are taking diabetes medicines or blood thinners.  Plan to have someone take you home from the hospital or clinic. What happens during the procedure?  An IV tube may be inserted into one of your veins.  You will be given medicine to help you relax (sedative).  To reduce your risk of infection: ? Your health care team will wash or sanitize their hands. ? Your anal area will be washed with soap.  You will be asked to lie on your side with your knees bent.  Your health care provider will lubricate a long, thin, flexible tube. The tube will have a camera and a light on the end.  The tube will be inserted into your   anus.  The tube will be gently eased through your rectum and colon.  Air will be delivered into your colon to keep it open. You may feel some pressure or cramping.  The camera will be used to take images during the procedure.  A small tissue sample may be removed from your body to be examined under a microscope (biopsy). If any potential problems are found, the tissue will be sent to a lab for testing.  If small polyps are found, your  health care provider may remove them and have them checked for cancer cells.  The tube that was inserted into your anus will be slowly removed. The procedure may vary among health care providers and hospitals. What happens after the procedure?  Your blood pressure, heart rate, breathing rate, and blood oxygen level will be monitored until the medicines you were given have worn off.  Do not drive for 24 hours after the exam.  You may have a small amount of blood in your stool.  You may pass gas and have mild abdominal cramping or bloating due to the air that was used to inflate your colon during the exam.  It is up to you to get the results of your procedure. Ask your health care provider, or the department performing the procedure, when your results will be ready. This information is not intended to replace advice given to you by your health care provider. Make sure you discuss any questions you have with your health care provider. Document Released: 05/06/2000 Document Revised: 03/09/2016 Document Reviewed: 07/21/2015 Elsevier Interactive Patient Education  [employer].  The patient is scheduled for a Colonoscopy at [facility] on 04/04/18. They are aware to call the day before to get their arrival time. He will continue his 81 mg aspirin. He will stop his fish oil 1 week prior. He will only take his Amlodipine the morning of with water. Miralax prescription has been sent into the patient's pharmacy. The patient is aware of date and instructions.

## 2018-03-27 NOTE — Progress Notes (Signed)
Patient ID: Adrian Thompson, male   DOB: 08-Mar-1946, 72 y.o.   MRN: [id number]  Chief Complaint  Patient presents with  . Colonoscopy    HPI Adrian Thompson is a 72 y.o. male here today for a evaluation of a screening colonoscopy and endoscopy.  Last colonoscopy was 02/22/2008. Moves his bowels daily. No GI problems at this time.  The patient reports no dysphasia and rare episodes of reflux. Past upper endoscopy showed hyperplastic polyps consistent with PPI use.  No evidence of Barrett's epithelial change.  HPI  Past Medical History:  Diagnosis Date  . Gout   . History of chicken pox   . History of measles   . History of mumps     Past Surgical History:  Procedure Laterality Date  . COCHLEAR IMPLANT Right 06/28/2016   Adrian Blanco, MD [facility]  . COLONOSCOPY  2009  . EXTERNAL EAR SURGERY  2018  . TONSILLECTOMY    . UPPER GI ENDOSCOPY  2009  . UPPER GI ENDOSCOPY  03/04/2011   [facility]; Excision of multiple gastric polyps    Family History  Problem Relation Age of Onset  . Hypertension Mother   . Hypertension Sister     Social History Social History   Tobacco Use  . Smoking status: Never Smoker  . Smokeless tobacco: Former Systems developer    Types: Snuff, Chew  Substance Use Topics  . Alcohol use: No    Alcohol/week: 0.0 standard drinks  . Drug use: No    No Known Allergies  Current Outpatient Medications  Medication Sig Dispense Refill  . amLODipine (NORVASC) 10 MG tablet Take 1 tablet (10 mg total) by mouth daily. 90 tablet 3  . aspirin 81 MG tablet Take 1 tablet by mouth daily.    Marland Kitchen atenolol (TENORMIN) 50 MG tablet TAKE 1 TABLET EVERY DAY 90 tablet 3  . finasteride (PROSCAR) 5 MG tablet Take 5 mg daily by mouth.    Marland Kitchen glucosamine-chondroitin 500-400 MG tablet Take 1 tablet by mouth daily.    Marland Kitchen glucose blood (ACCU-CHEK AVIVA PLUS) test strip Use as instructed to check blood sugar once daily. Dx type 2 diabetes. (E11.9) 100 each 4  . indomethacin (INDOCIN) 25 MG capsule Take 1  capsule (25 mg total) by mouth 3 (three) times daily with meals as needed. Up to 3 times a day 90 capsule 0  . KLOR-CON M20 20 MEQ tablet TAKE 1 TABLET EVERY DAY 90 tablet 3  . losartan-hydrochlorothiazide (HYZAAR) 100-25 MG tablet TAKE 1 TABLET EVERY DAY 90 tablet 3  . Misc Natural Products (BLACK CHERRY CONCENTRATE PO) Take 1 tablet by mouth daily.    . montelukast (SINGULAIR) 10 MG tablet TAKE 1 TABLET EVERY DAY 90 tablet 4  . Multiple Vitamins-Minerals (MULTIVITAMIN ADULT PO) Take 1 tablet by mouth daily.    . nortriptyline (PAMELOR) 25 MG capsule TAKE 1 CAPSULE EVERY DAY 90 capsule 4  . Omega-3 Fatty Acids (FISH OIL) 1000 MG CPDR Take 1 capsule by mouth daily.    Marland Kitchen omeprazole (PRILOSEC) 20 MG capsule TAKE 1 CAPSULE EVERY DAY 90 capsule 4  . pravastatin (PRAVACHOL) 20 MG tablet TAKE 1 TABLET EVERY DAY 90 tablet 4  . tamsulosin (FLOMAX) 0.4 MG CAPS capsule Take 1 capsule by mouth daily.    . Triamcinolone Acetonide (NASACORT AQ NA) Place into the nose.     No current facility-administered medications for this visit.     Review of Systems Review of Systems  Constitutional: Negative.  Respiratory: Negative.   Cardiovascular: Negative.     Blood pressure (!) 144/74, pulse 74, resp. rate 14, height 6' (1.829 m), weight 218 lb (98.9 kg).  Physical Exam Physical Exam  Constitutional: He is oriented to person, place, and time. He appears well-developed and well-nourished.  Cardiovascular: Normal rate, regular rhythm and normal heart sounds.  Pulmonary/Chest: Effort normal and breath sounds normal.  Neurological: He is alert and oriented to person, place, and time.  Skin: Skin is warm and dry.    Data Reviewed Upper endoscopy of November 08, 1999 showed superficial gastritis and mild inflammation of the esophageal distal mucosa.:  Biopsy showed a hyperplastic polyp in the rectum.  Similar findings in the stomach were identified at the time of his 2007 and 2009 exam.  No evidence of  Barrett's epithelial change.  Colonoscopy completed February 26, 2008 showed a single diverticulum in the ascending colon.  Hemoglobin A1c of January 08, 2018: 6.4.  Conference of metabolic panel of April 03, 2017 showed normal electrolytes, creatinine of 0.9 with estimated GFR of 83.  Normal liver function studies.  Assessment    Candidate for repeat colonoscopy.    Plan The patient has had no changes in intermittent upper GI symptoms and based on previous endoscopies x3 with no interval change I do not think an upper endoscopy is warranted at this time.  Colonoscopy with possible biopsy/polypectomy prn: Information regarding the procedure, including its potential risks and complications (including but not limited to perforation of the bowel, which may require emergency surgery to repair, and bleeding) was verbally given to the patient. Educational information regarding lower intestinal endoscopy was given to the patient. Written instructions for how to complete the bowel prep using Miralax were provided. The importance of drinking ample fluids to avoid dehydration as a result of the prep emphasized.  HPI, Physical Exam, Assessment and Plan have been scribed under the direction and in the presence of Hervey Ard, MD.  Adrian Thompson, CMA  I have completed the exam and reviewed the above documentation for accuracy and completeness.  I agree with the above.  Haematologist has been used and any errors in dictation or transcription are unintentional.  Hervey Ard, M.D., F.A.C.S. The patient is scheduled for a Colonoscopy at [facility] on 04/04/18. They are aware to call the day before to get their arrival time. He will continue his 81 mg aspirin. He will stop his fish oil 1 week prior. He will only take his Amlodipine the morning of with water. Miralax prescription has been sent into the patient's pharmacy. The patient is aware of date and instructions. Documented by Caryl-Lyn Otis Brace  LPN  Forest Gleason Adylee Leonardo 03/28/2018, 11:53 AM

## 2018-03-28 DIAGNOSIS — Z1211 Encounter for screening for malignant neoplasm of colon: Secondary | ICD-10-CM | POA: Insufficient documentation

## 2018-04-03 ENCOUNTER — Ambulatory Visit (INDEPENDENT_AMBULATORY_CARE_PROVIDER_SITE_OTHER): Payer: Medicare Other | Admitting: Family Medicine

## 2018-04-03 ENCOUNTER — Ambulatory Visit (INDEPENDENT_AMBULATORY_CARE_PROVIDER_SITE_OTHER): Payer: Medicare Other

## 2018-04-03 VITALS — BP 144/84

## 2018-04-03 VITALS — BP 162/78 | HR 56 | Temp 98.0°F | Ht 72.0 in | Wt 216.4 lb

## 2018-04-03 DIAGNOSIS — E119 Type 2 diabetes mellitus without complications: Secondary | ICD-10-CM | POA: Diagnosis not present

## 2018-04-03 DIAGNOSIS — E785 Hyperlipidemia, unspecified: Secondary | ICD-10-CM

## 2018-04-03 DIAGNOSIS — I493 Ventricular premature depolarization: Secondary | ICD-10-CM | POA: Diagnosis not present

## 2018-04-03 DIAGNOSIS — I10 Essential (primary) hypertension: Secondary | ICD-10-CM

## 2018-04-03 DIAGNOSIS — Z0000 Encounter for general adult medical examination without abnormal findings: Secondary | ICD-10-CM

## 2018-04-03 MED ORDER — NORTRIPTYLINE HCL 10 MG PO CAPS: 10.0000 mg | ORAL_CAPSULE | Freq: Every day | ORAL | 1 refills | Status: DC

## 2018-04-03 NOTE — Patient Instructions (Signed)
Adrian Thompson , Thank you for taking time to come for your Medicare Wellness Visit. I appreciate your ongoing commitment to your health goals. Please review the following plan we discussed and let me know if I can assist you in the future.   Screening recommendations/referrals: Colonoscopy: Scheduled for tomorrow, 04/04/18. Recommended yearly ophthalmology/optometry visit for glaucoma screening and checkup Recommended yearly dental visit for hygiene and checkup  Vaccinations: Influenza vaccine: Up to date Pneumococcal vaccine: Completed series Tdap vaccine: Up to date, due 01/2021 Shingles vaccine: Completed series    Advanced directives: On file.   Conditions/risks identified: Continue trying to exercise (walk) three times a week for at least 30 minutes at a time.   Next appointment: 10:00 AM today with Dr Caryn Section.   Preventive Care 9 Years and Older, Male Preventive care refers to lifestyle choices and visits with your health care provider that can promote health and wellness. What does preventive care include?  A yearly physical exam. This is also called an annual well check.  Dental exams once or twice a year.  Routine eye exams. Ask your health care provider how often you should have your eyes checked.  Personal lifestyle choices, including:  Daily care of your teeth and gums.  Regular physical activity.  Eating a healthy diet.  Avoiding tobacco and drug use.  Limiting alcohol use.  Practicing safe sex.  Taking low doses of aspirin every day.  Taking vitamin and mineral supplements as recommended by your health care provider. What happens during an annual well check? The services and screenings done by your health care provider during your annual well check will depend on your age, overall health, lifestyle risk factors, and family history of disease. Counseling  Your health care provider may ask you questions about your:  Alcohol use.  Tobacco use.  Drug  use.  Emotional well-being.  Home and relationship well-being.  Sexual activity.  Eating habits.  History of falls.  Memory and ability to understand (cognition).  Work and work Statistician. Screening  You may have the following tests or measurements:  Height, weight, and BMI.  Blood pressure.  Lipid and cholesterol levels. These may be checked every 5 years, or more frequently if you are over 75 years old.  Skin check.  Lung cancer screening. You may have this screening every year starting at age 59 if you have a 30-pack-year history of smoking and currently smoke or have quit within the past 15 years.  Fecal occult blood test (FOBT) of the stool. You may have this test every year starting at age 7.  Flexible sigmoidoscopy or colonoscopy. You may have a sigmoidoscopy every 5 years or a colonoscopy every 10 years starting at age 13.  Prostate cancer screening. Recommendations will vary depending on your family history and other risks.  Hepatitis C blood test.  Hepatitis B blood test.  Sexually transmitted disease (STD) testing.  Diabetes screening. This is done by checking your blood sugar (glucose) after you have not eaten for a while (fasting). You may have this done every 1-3 years.  Abdominal aortic aneurysm (AAA) screening. You may need this if you are a current or former smoker.  Osteoporosis. You may be screened starting at age 72 if you are at high risk. Talk with your health care provider about your test results, treatment options, and if necessary, the need for more tests. Vaccines  Your health care provider may recommend certain vaccines, such as:  Influenza vaccine. This is recommended every year.  Tetanus, diphtheria, and acellular pertussis (Tdap, Td) vaccine. You may need a Td booster every 10 years.  Zoster vaccine. You may need this after age 11.  Pneumococcal 13-valent conjugate (PCV13) vaccine. One dose is recommended after age  6.  Pneumococcal polysaccharide (PPSV23) vaccine. One dose is recommended after age 69. Talk to your health care provider about which screenings and vaccines you need and how often you need them. This information is not intended to replace advice given to you by your health care provider. Make sure you discuss any questions you have with your health care provider. Document Released: 06/05/2015 Document Revised: 01/27/2016 Document Reviewed: 03/10/2015 Elsevier Interactive Patient Education  2017 [place] Prevention in the Home Falls can cause injuries. They can happen to people of all ages. There are many things you can do to make your home safe and to help prevent falls. What can I do on the outside of my home?  Regularly fix the edges of walkways and driveways and fix any cracks.  Remove anything that might make you trip as you walk through a door, such as a raised step or threshold.  Trim any bushes or trees on the path to your home.  Use bright outdoor lighting.  Clear any walking paths of anything that might make someone trip, such as rocks or tools.  Regularly check to see if handrails are loose or broken. Make sure that both sides of any steps have handrails.  Any raised decks and porches should have guardrails on the edges.  Have any leaves, snow, or ice cleared regularly.  Use sand or salt on walking paths during winter.  Clean up any spills in your garage right away. This includes oil or grease spills. What can I do in the bathroom?  Use night lights.  Install grab bars by the toilet and in the tub and shower. Do not use towel bars as grab bars.  Use non-skid mats or decals in the tub or shower.  If you need to sit down in the shower, use a plastic, non-slip stool.  Keep the floor dry. Clean up any water that spills on the floor as soon as it happens.  Remove soap buildup in the tub or shower regularly.  Attach bath mats securely with double-sided  non-slip rug tape.  Do not have throw rugs and other things on the floor that can make you trip. What can I do in the bedroom?  Use night lights.  Make sure that you have a light by your bed that is easy to reach.  Do not use any sheets or blankets that are too big for your bed. They should not hang down onto the floor.  Have a firm chair that has side arms. You can use this for support while you get dressed.  Do not have throw rugs and other things on the floor that can make you trip. What can I do in the kitchen?  Clean up any spills right away.  Avoid walking on wet floors.  Keep items that you use a lot in easy-to-reach places.  If you need to reach something above you, use a strong step stool that has a grab bar.  Keep electrical cords out of the way.  Do not use floor polish or wax that makes floors slippery. If you must use wax, use non-skid floor wax.  Do not have throw rugs and other things on the floor that can make you trip. What can I do  with my stairs?  Do not leave any items on the stairs.  Make sure that there are handrails on both sides of the stairs and use them. Fix handrails that are broken or loose. Make sure that handrails are as long as the stairways.  Check any carpeting to make sure that it is firmly attached to the stairs. Fix any carpet that is loose or worn.  Avoid having throw rugs at the top or bottom of the stairs. If you do have throw rugs, attach them to the floor with carpet tape.  Make sure that you have a light switch at the top of the stairs and the bottom of the stairs. If you do not have them, ask someone to add them for you. What else can I do to help prevent falls?  Wear shoes that:  Do not have high heels.  Have rubber bottoms.  Are comfortable and fit you well.  Are closed at the toe. Do not wear sandals.  If you use a stepladder:  Make sure that it is fully opened. Do not climb a closed stepladder.  Make sure that both  sides of the stepladder are locked into place.  Ask someone to hold it for you, if possible.  Clearly mark and make sure that you can see:  Any grab bars or handrails.  First and last steps.  Where the edge of each step is.  Use tools that help you move around (mobility aids) if they are needed. These include:  Canes.  Walkers.  Scooters.  Crutches.  Turn on the lights when you go into a dark area. Replace any light bulbs as soon as they burn out.  Set up your furniture so you have a clear path. Avoid moving your furniture around.  If any of your floors are uneven, fix them.  If there are any pets around you, be aware of where they are.  Review your medicines with your doctor. Some medicines can make you feel dizzy. This can increase your chance of falling. Ask your doctor what other things that you can do to help prevent falls. This information is not intended to replace advice given to you by your health care provider. Make sure you discuss any questions you have with your health care provider. Document Released: 03/05/2009 Document Revised: 10/15/2015 Document Reviewed: 06/13/2014 Elsevier Interactive Patient Education  2017 [employer].

## 2018-04-03 NOTE — Progress Notes (Signed)
Patient: Adrian Thompson Male    DOB: 1945/11/04   72 y.o.   MRN: [id number] Visit Date: 04/03/2018  Today's Provider: Lelon Huh, MD   Chief Complaint  Patient presents with  . Diabetes  . Hypertension  . Hyperlipidemia   Subjective:    HPI  Diabetes Mellitus Type II, Follow-up:   Lab Results  Component Value Date   HGBA1C 6.4 (A) 01/08/2018   HGBA1C 6.6 09/29/2017   HGBA1C 6.4 04/03/2017   Last seen for diabetes 2 months ago.  Management since then includes no changes. He reports good compliance with treatment. He is not having side effects.  Current symptoms include none and have been stable. Home blood sugar records: fasting range: 120's  Episodes of hypoglycemia? no   Current Insulin Regimen: none Most Recent Eye Exam: <1 year ago Weight trend: stable Prior visit with dietician: no Current diet: in general, an "unhealthy" diet Current exercise: none  ------------------------------------------------------------------------   Hypertension, follow-up:  BP Readings from Last 3 Encounters:  04/03/18 (!) 144/84  04/03/18 (!) 162/78  03/27/18 (!) 144/74    He was last seen for hypertension 2 months ago.  BP at that visit was 141/77. Management since that visit includes no changes.He reports good compliance with treatment. He is not having side effects.  He is not exercising. He is not adherent to low salt diet.   Outside blood pressures are 137/82. He is experiencing none.  Patient denies chest pain, chest pressure/discomfort, claudication, dyspnea, exertional chest pressure/discomfort, fatigue, irregular heart beat, lower extremity edema, near-syncope, orthopnea, palpitations, paroxysmal nocturnal dyspnea, syncope and tachypnea.   Cardiovascular risk factors include advanced age (older than 69 for men, 46 for women), diabetes mellitus, dyslipidemia, hypertension and male gender.  Use of agents associated with hypertension: NSAIDS.    ------------------------------------------------------------------------    Lipid/Cholesterol, Follow-up:   Last seen for this 1 years ago.  Management since that visit includes none.  Last Lipid Panel:    Component Value Date/Time   CHOL 156 04/03/2017 1030   CHOL 150 04/01/2016 1116   TRIG 86 04/03/2017 1030   HDL 67 04/03/2017 1030   HDL 57 04/01/2016 1116   CHOLHDL 2.3 04/03/2017 1030   LDLCALC 72 04/03/2017 1030    He reports good compliance with treatment. He is not having side effects.   Wt Readings from Last 3 Encounters:  04/03/18 216 lb 6.4 oz (98.2 kg)  03/27/18 218 lb (98.9 kg)  01/08/18 215 lb (97.5 kg)    ------------------------------------------------------------------------     No Known Allergies   Current Outpatient Medications:  .  amLODipine (NORVASC) 10 MG tablet, Take 1 tablet (10 mg total) by mouth daily., Disp: 90 tablet, Rfl: 3 .  aspirin 81 MG tablet, Take 1 tablet by mouth daily., Disp: , Rfl:  .  atenolol (TENORMIN) 50 MG tablet, TAKE 1 TABLET EVERY DAY, Disp: 90 tablet, Rfl: 3 .  finasteride (PROSCAR) 5 MG tablet, Take 5 mg daily by mouth., Disp: , Rfl:  .  glucosamine-chondroitin 500-400 MG tablet, Take 1 tablet by mouth daily., Disp: , Rfl:  .  glucose blood (ACCU-CHEK AVIVA PLUS) test strip, Use as instructed to check blood sugar once daily. Dx type 2 diabetes. (E11.9), Disp: 100 each, Rfl: 4 .  indomethacin (INDOCIN) 25 MG capsule, Take 1 capsule (25 mg total) by mouth 3 (three) times daily with meals as needed. Up to 3 times a day, Disp: 90 capsule, Rfl: 0 .  KLOR-CON  M20 20 MEQ tablet, TAKE 1 TABLET EVERY DAY, Disp: 90 tablet, Rfl: 3 .  losartan-hydrochlorothiazide (HYZAAR) 100-25 MG tablet, TAKE 1 TABLET EVERY DAY, Disp: 90 tablet, Rfl: 3 .  Misc Natural Products (BLACK CHERRY CONCENTRATE PO), Take 1 tablet by mouth daily., Disp: , Rfl:  .  montelukast (SINGULAIR) 10 MG tablet, TAKE 1 TABLET EVERY DAY, Disp: 90 tablet, Rfl: 4 .   Multiple Vitamins-Minerals (MULTIVITAMIN ADULT PO), Take 1 tablet by mouth daily., Disp: , Rfl:  .  nortriptyline (PAMELOR) 25 MG capsule, TAKE 1 CAPSULE EVERY DAY, Disp: 90 capsule, Rfl: 4 .  Omega-3 Fatty Acids (FISH OIL) 1000 MG CPDR, Take 1 capsule by mouth daily., Disp: , Rfl:  .  omeprazole (PRILOSEC) 20 MG capsule, TAKE 1 CAPSULE EVERY DAY, Disp: 90 capsule, Rfl: 4 .  polyethylene glycol powder (GLYCOLAX/MIRALAX) powder, , Disp: , Rfl: 0 .  pravastatin (PRAVACHOL) 20 MG tablet, TAKE 1 TABLET EVERY DAY, Disp: 90 tablet, Rfl: 4 .  tamsulosin (FLOMAX) 0.4 MG CAPS capsule, Take 1 capsule by mouth daily., Disp: , Rfl:  .  Triamcinolone Acetonide (NASACORT AQ NA), Place into the nose., Disp: , Rfl:   Review of Systems  Constitutional: Negative for appetite change, chills, fatigue and fever.  HENT: Positive for hearing loss and sinus pressure. Negative for congestion, ear pain, nosebleeds and trouble swallowing.   Eyes: Negative for pain and visual disturbance.  Respiratory: Negative for cough, chest tightness and shortness of breath.   Cardiovascular: Negative for chest pain, palpitations and leg swelling.  Gastrointestinal: Negative for abdominal pain, blood in stool, constipation, diarrhea, nausea and vomiting.  Endocrine: Negative for polydipsia, polyphagia and polyuria.  Genitourinary: Negative for dysuria and flank pain.  Musculoskeletal: Negative for arthralgias, back pain, joint swelling, myalgias and neck stiffness.  Skin: Negative for color change, rash and wound.  Neurological: Negative for dizziness, tremors, seizures, speech difficulty, weakness, light-headedness and headaches.  Hematological: Bruises/bleeds easily.  Psychiatric/Behavioral: Negative for behavioral problems, confusion, decreased concentration, dysphoric mood and sleep disturbance. The patient is not nervous/anxious.   All other systems reviewed and are negative.   Social History   Tobacco Use  . Smoking  status: Never Smoker  . Smokeless tobacco: Former Systems developer    Types: Snuff, Chew  Substance Use Topics  . Alcohol use: No    Alcohol/week: 0.0 standard drinks   Objective:   BP (!) 144/84 (BP Location: Left Arm, Patient Position: Sitting, Cuff Size: Large)  Vitals:   04/03/18 0958  BP: (!) 144/84     BP 162/78 (BP Location: Right Arm)   Pulse 56    Temp 98 F (36.7 C) (Oral)   Ht 6' (1.829 m)   Wt 216 lb 6.4 oz (98.2 kg)   BMI 29.35 kg/m   BSA 2.23 m   Pain [place] 0-No pain   Physical Exam   General Appearance:    Alert, cooperative, no distress  Eyes:    PERRL, conjunctiva/corneas clear, EOM's intact       Lungs:     Clear to auscultation bilaterally, respirations unlabored  Heart:    Regular rate and rhythm  Neurologic:   Awake, alert, oriented x 3. No apparent focal neurological           defect.          Assessment & Plan:     1. Type 2 diabetes mellitus without complication, without long-term current use of insulin (HCC) Well controlled.  Continue current medications.   -  EKG 12-Lead - Comprehensive metabolic panel  2. Essential (primary) hypertension Compliant with medications but SBP not at goal today. Work on losing weight and reducing sodium in diet. Encourage regular  - EKG 12-Lead - Comprehensive metabolic panel - TSH  3. Hyperlipidemia, unspecified hyperlipidemia type He is tolerating pravastatin well with no adverse effects.   - EKG 12-Lead - Lipid panel  4. Frequent PVCs Incidental finding on EKG.  - TSH - Magnesium       Lelon Huh, MD  [facility]

## 2018-04-03 NOTE — Progress Notes (Signed)
Subjective:   Adrian Thompson is a 72 y.o. male who presents for Medicare Annual/Subsequent preventive examination.  Review of Systems:  N/A  Cardiac Risk Factors include: advanced age (>37men, >67 women);diabetes mellitus;hypertension;dyslipidemia;male gender     Objective:    Vitals: BP (!) 162/78 (BP Location: Right Arm)   Pulse (!) 56   Temp 98 F (36.7 C) (Oral)   Ht 6' (1.829 m)   Wt 216 lb 6.4 oz (98.2 kg)   BMI 29.35 kg/m   Body mass index is 29.35 kg/m.  Advanced Directives 04/03/2018 04/03/2017  Does Patient Have a Medical Advance Directive? Yes Yes  Type of Paramedic of [place];Living will [place];Living will  Copy of [place] in Chart? Yes - validated most recent copy scanned in chart (See row information) No - copy requested    Tobacco Social History   Tobacco Use  Smoking Status Never Smoker  Smokeless Tobacco Former Systems developer  . Types: Snuff, Chew     Counseling given: Not Answered   Clinical Intake:  Pre-visit preparation completed: Yes  Pain : No/denies pain Pain Score: 0-No pain    Nutrition Risk Assessment:  Has the patient had any N/V/D within the last 2 months?  No  Does the patient have any non-healing wounds?  No  Has the patient had any unintentional weight loss or weight gain?  No   Diabetes:  Is the patient diabetic?  Yes  If diabetic, was a CBG obtained today?  No  Did the patient bring in their glucometer from home?  No  How often do you monitor your CBG's? Four to five times weekly.   Financial Strains and Diabetes Management:  Are you having any financial strains with the device, your supplies or your medication? No .  Does the patient want to be seen by Chronic Care Management for management of their diabetes?  No  Would the patient like to be referred to a Nutritionist or for Diabetic Management?  No   Diabetic Exams:  Diabetic Eye Exam: Completed in early  2019 per pt. Goes to [facility]. Will contact office for records.   Diabetic Foot Exam: Completed 04/03/17. Due today, note made to have completed at CPE.    How often do you need to have someone help you when you read instructions, pamphlets, or other written materials from your doctor or pharmacy?: 1 - Never  Interpreter Needed?: No  Information entered by :: [facility], LPN  Past Medical History:  Diagnosis Date  . Diabetes mellitus without complication ([place])    type 2  . Gout   . History of chicken pox   . History of measles   . History of mumps   . Hyperlipidemia   . Hypertension    Past Surgical History:  Procedure Laterality Date  . COCHLEAR IMPLANT Right 06/28/2016   Vira Blanco, [facility]  . COLONOSCOPY  2009  . EXTERNAL EAR SURGERY  2018  . TONSILLECTOMY    . UPPER GI ENDOSCOPY  2009  . UPPER GI ENDOSCOPY  03/04/2011   [facility]; Excision of multiple gastric polyps   Family History  Problem Relation Age of Onset  . Hypertension Mother   . Hypertension Sister    Social History   Socioeconomic History  . Marital status: Married    Spouse name: Not on file  . Number of children: 0  . Years of education: Not on file  . Highest education  level: Associate degree: occupational, Hotel manager, or vocational program  Occupational History  . Occupation: Retired  Scientific laboratory technician  . Financial resource strain: Not hard at all  . Food insecurity:    Worry: Never true    Inability: Never true  . Transportation needs:    Medical: No    Non-medical: No  Tobacco Use  . Smoking status: Never Smoker  . Smokeless tobacco: Former Systems developer    Types: Snuff, Chew  Substance and Sexual Activity  . Alcohol use: No    Alcohol/week: 0.0 standard drinks  . Drug use: No  . Sexual activity: Not on file  Lifestyle  . Physical activity:    Days per week: 0 days    Minutes per session: 0 min  . Stress: Not at all  Relationships  . Social connections:    Talks on phone: Patient  refused    Gets together: Patient refused    Attends religious service: Patient refused    Active member of club or organization: Patient refused    Attends meetings of clubs or organizations: Patient refused    Relationship status: Patient refused  Other Topics Concern  . Not on file  Social History Narrative  . Not on file    Outpatient Encounter Medications as of 04/03/2018  Medication Sig  . amLODipine (NORVASC) 10 MG tablet Take 1 tablet (10 mg total) by mouth daily.  Marland Kitchen aspirin 81 MG tablet Take 1 tablet by mouth daily.  Marland Kitchen atenolol (TENORMIN) 50 MG tablet TAKE 1 TABLET EVERY DAY  . finasteride (PROSCAR) 5 MG tablet Take 5 mg daily by mouth.  Marland Kitchen glucosamine-chondroitin 500-400 MG tablet Take 1 tablet by mouth daily.  Marland Kitchen glucose blood (ACCU-CHEK AVIVA PLUS) test strip Use as instructed to check blood sugar once daily. Dx type 2 diabetes. (E11.9)  . indomethacin (INDOCIN) 25 MG capsule Take 1 capsule (25 mg total) by mouth 3 (three) times daily with meals as needed. Up to 3 times a day  . KLOR-CON M20 20 MEQ tablet TAKE 1 TABLET EVERY DAY  . losartan-hydrochlorothiazide (HYZAAR) 100-25 MG tablet TAKE 1 TABLET EVERY DAY  . Misc Natural Products (BLACK CHERRY CONCENTRATE PO) Take 1 tablet by mouth daily.  . montelukast (SINGULAIR) 10 MG tablet TAKE 1 TABLET EVERY DAY  . Multiple Vitamins-Minerals (MULTIVITAMIN ADULT PO) Take 1 tablet by mouth daily.  . nortriptyline (PAMELOR) 25 MG capsule TAKE 1 CAPSULE EVERY DAY  . Omega-3 Fatty Acids (FISH OIL) 1000 MG CPDR Take 1 capsule by mouth daily.  Marland Kitchen omeprazole (PRILOSEC) 20 MG capsule TAKE 1 CAPSULE EVERY DAY  . polyethylene glycol powder (GLYCOLAX/MIRALAX) powder   . pravastatin (PRAVACHOL) 20 MG tablet TAKE 1 TABLET EVERY DAY  . tamsulosin (FLOMAX) 0.4 MG CAPS capsule Take 1 capsule by mouth daily.  . Triamcinolone Acetonide (NASACORT AQ NA) Place into the nose.   No facility-administered encounter medications on file as of 04/03/2018.       Activities of Daily Living In your present state of health, do you have any difficulty performing the following activities: 04/03/2018 04/03/2017  Hearing? Tempie Donning  Comment Wears a hearing aids in the left ear and a cochlear implant on the right. wears a hearing aid and has a cochlear implant  Vision? N N  Comment Wears eye glasses.  -  Difficulty concentrating or making decisions? N N  Walking or climbing stairs? N N  Dressing or bathing? N N  Doing errands, shopping? N N  Conservation officer, nature and  eating ? N N  Using the Toilet? N N  In the past six months, have you accidently leaked urine? N N  Do you have problems with loss of bowel control? N N  Managing your Medications? N N  Managing your Finances? N N  Housekeeping or managing your Housekeeping? N N  Some recent data might be hidden    Patient Care Team: Birdie Sons, MD as PCP - General (Family Medicine) Murrell Redden, MD (Urology) Bary Castilla Forest Gleason, MD (General Surgery) Carmelina Noun, [place] (Inactive) (Urology) Oneta Rack, MD (Dermatology) Throat, [place] [facility]ose And   Assessment:   This is a routine wellness examination for Adrian Thompson.  Exercise Activities and Dietary recommendations Current Exercise Habits: The patient does not participate in regular exercise at present, Exercise limited by: None identified  Goals    . Exercise 3x per week (30 min per time)     Recommend to start exercising three times a week for 30 minutes.        Fall Risk Fall Risk  04/03/2018 03/27/2018 04/03/2017 04/01/2016 03/02/2015  Falls in the past year? 0 0 No No No   FALL RISK PREVENTION PERTAINING TO THE HOME:  Any stairs in or around the home WITH handrails? No  Home free of loose throw rugs in walkways, pet beds, electrical cords, etc? Yes  Adequate lighting in your home to reduce risk of falls? Yes   ASSISTIVE DEVICES UTILIZED TO PREVENT FALLS:  Life alert? No  Use of a cane, walker or w/c? No  Grab bars in the  bathroom? Yes  Shower chair or bench in shower? No  Elevated toilet seat or a handicapped toilet? Yes    TIMED UP AND GO:  Was the test performed? No .    Depression Screen PHQ 2/9 Scores 04/03/2018 04/03/2017 04/01/2016 03/02/2015  PHQ - 2 Score 0 0 0 0  PHQ- 9 Score - - - 1    Cognitive Function: Declined today.         Immunization History  Administered Date(s) Administered  . Influenza, High Dose Seasonal PF 03/02/2015, 03/07/2016, 02/23/2018  . Influenza-Unspecified 03/09/2017  . Pneumococcal Conjugate-13 02/26/2014  . Pneumococcal Polysaccharide-23 02/25/2013  . Td 03/20/2001  . Tdap 02/14/2011  . Zoster 06/19/2007  . Zoster Recombinat (Shingrix) 08/31/2017, 11/08/2017    Qualifies for Shingles Vaccine? Up to date  Tdap: Up to date  Flu Vaccine: Up to date  Pneumococcal Vaccine: Up to date   Screening Tests Health Maintenance  Topic Date Due  . OPHTHALMOLOGY EXAM  06/23/2017  . COLONOSCOPY  02/25/2018  . FOOT EXAM  04/03/2018  . HEMOGLOBIN A1C  07/11/2018  . TETANUS/TDAP  02/13/2021  . INFLUENZA VACCINE  Completed  . PNA vac Low Risk Adult  Completed  . Hepatitis C Screening  Addressed   Cancer Screenings:  Colorectal Screening: Completed 02/26/08. Repeat every 10 years. Colonoscopy scheduled for tomorrow, 04/04/18.  Lung Cancer Screening: (Low Dose CT Chest recommended if Age 39-80 years, 30 pack-year currently smoking OR have quit w/in 15years.) does not qualify.    Additional Screening:  Hepatitis C Screening: Up to date  Vision Screening: Recommended annual ophthalmology exams for early detection of glaucoma and other disorders of the eye.  Dental Screening: Recommended annual dental exams for proper oral hygiene  Community Resource Referral:  CRR required this visit?  No        Plan:  I have personally reviewed and addressed the Medicare Annual Wellness questionnaire  and have noted the following in the patient's chart:   A. Medical and social history B. Use of alcohol, tobacco or illicit drugs  C. Current medications and supplements D. Functional ability and status E.  Nutritional status F.  Physical activity G. Advance directives H. List of other physicians I.  Hospitalizations, surgeries, and ER visits in previous 12 months J.  [place] such as hearing and vision if needed, cognitive and depression L. Referrals and appointments - none  In addition, I have reviewed and discussed with patient certain preventive protocols, quality metrics, and best practice recommendations. A written personalized care plan for preventive services as well as general preventive health recommendations were provided to patient.  See attached scanned questionnaire for additional information.   Signed,  Fabio Neighbors, LPN Nurse Health Advisor   Nurse Recommendations: Pt needs a diabetic foot exam today. Colonoscopy scheduled for tomorrow, 04/04/18. Pt states he had an eye exam earlier this year. Will contact [facility] for records to update HM.

## 2018-04-04 ENCOUNTER — Ambulatory Visit
Admission: RE | Admit: 2018-04-04 | Discharge: 2018-04-04 | Disposition: A | Discharge status: Home / Self Care | Payer: Medicare Other | Source: Ambulatory Visit | Attending: General Surgery | Admitting: General Surgery

## 2018-04-04 ENCOUNTER — Ambulatory Visit: Payer: Medicare Other | Admitting: Registered Nurse

## 2018-04-04 ENCOUNTER — Encounter
Admission: RE | Disposition: A | Discharge status: Home / Self Care | Payer: Self-pay | Source: Ambulatory Visit | Attending: General Surgery

## 2018-04-04 ENCOUNTER — Encounter: Payer: Self-pay | Admitting: *Deleted

## 2018-04-04 DIAGNOSIS — Z7982 Long term (current) use of aspirin: Secondary | ICD-10-CM | POA: Insufficient documentation

## 2018-04-04 DIAGNOSIS — I10 Essential (primary) hypertension: Secondary | ICD-10-CM | POA: Insufficient documentation

## 2018-04-04 DIAGNOSIS — Z7984 Long term (current) use of oral hypoglycemic drugs: Secondary | ICD-10-CM | POA: Insufficient documentation

## 2018-04-04 DIAGNOSIS — M109 Gout, unspecified: Secondary | ICD-10-CM | POA: Insufficient documentation

## 2018-04-04 DIAGNOSIS — Z1211 Encounter for screening for malignant neoplasm of colon: Secondary | ICD-10-CM | POA: Insufficient documentation

## 2018-04-04 DIAGNOSIS — D126 Benign neoplasm of colon, unspecified: Secondary | ICD-10-CM | POA: Diagnosis not present

## 2018-04-04 DIAGNOSIS — K219 Gastro-esophageal reflux disease without esophagitis: Secondary | ICD-10-CM | POA: Diagnosis not present

## 2018-04-04 DIAGNOSIS — E119 Type 2 diabetes mellitus without complications: Secondary | ICD-10-CM | POA: Insufficient documentation

## 2018-04-04 DIAGNOSIS — E785 Hyperlipidemia, unspecified: Secondary | ICD-10-CM | POA: Insufficient documentation

## 2018-04-04 DIAGNOSIS — D122 Benign neoplasm of ascending colon: Secondary | ICD-10-CM | POA: Insufficient documentation

## 2018-04-04 DIAGNOSIS — Z79899 Other long term (current) drug therapy: Secondary | ICD-10-CM | POA: Diagnosis not present

## 2018-04-04 DIAGNOSIS — K635 Polyp of colon: Secondary | ICD-10-CM | POA: Diagnosis not present

## 2018-04-04 HISTORY — PX: COLONOSCOPY WITH PROPOFOL: SHX5780

## 2018-04-04 LAB — MAGNESIUM: MAGNESIUM: 2 mg/dL (ref 1.6–2.3)

## 2018-04-04 LAB — TSH: TSH: 1.19 u[IU]/mL (ref 0.450–4.500)

## 2018-04-04 LAB — COMPREHENSIVE METABOLIC PANEL
ALBUMIN: 4.4 g/dL (ref 3.5–4.8)
ALK PHOS: 61 IU/L (ref 39–117)
ALT: 17 IU/L (ref 0–44)
AST: 20 IU/L (ref 0–40)
Albumin/Globulin Ratio: 1.8 (ref 1.2–2.2)
BUN / CREAT RATIO: 15 (ref 10–24)
BUN: 13 mg/dL (ref 8–27)
Bilirubin Total: 0.7 mg/dL (ref 0.0–1.2)
CO2: 26 mmol/L (ref 20–29)
CREATININE: 0.86 mg/dL (ref 0.76–1.27)
Calcium: 9.6 mg/dL (ref 8.6–10.2)
Chloride: 100 mmol/L (ref 96–106)
GFR calc Af Amer: 101 mL/min/{1.73_m2} (ref >59)
GFR calc non Af Amer: 87 mL/min/{1.73_m2} (ref >59)
GLUCOSE: 120 mg/dL — AB (ref 65–99)
Globulin, Total: 2.4 g/dL (ref 1.5–4.5)
Potassium: 3.7 mmol/L (ref 3.5–5.2)
Sodium: 142 mmol/L (ref 134–144)
Total Protein: 6.8 g/dL (ref 6.0–8.5)

## 2018-04-04 LAB — LIPID PANEL
CHOLESTEROL TOTAL: 153 mg/dL (ref 100–199)
Chol/HDL Ratio: 2.3 ratio (ref 0.0–5.0)
HDL: 67 mg/dL (ref >39)
LDL CALC: 67 mg/dL (ref 0–99)
Triglycerides: 97 mg/dL (ref 0–149)
VLDL CHOLESTEROL CAL: 19 mg/dL (ref 5–40)

## 2018-04-04 LAB — GLUCOSE, CAPILLARY: Glucose-Capillary: 122 mg/dL — ABNORMAL HIGH (ref 70–99)

## 2018-04-04 SURGERY — COLONOSCOPY WITH PROPOFOL
Anesthesia: General

## 2018-04-04 MED ORDER — PROPOFOL 500 MG/50ML IV EMUL
INTRAVENOUS | Status: DC | PRN
  Administered 2018-04-04: 150 ug/kg/min via INTRAVENOUS

## 2018-04-04 MED ORDER — SODIUM CHLORIDE 0.9 % IV SOLN
INTRAVENOUS | Status: DC
  Administered 2018-04-04: 09:00:00 via INTRAVENOUS

## 2018-04-04 MED ORDER — LIDOCAINE HCL (CARDIAC) PF 100 MG/5ML IV SOSY
PREFILLED_SYRINGE | INTRAVENOUS | Status: DC | PRN
  Administered 2018-04-04: 40 mg via INTRAVENOUS

## 2018-04-04 MED ORDER — PROPOFOL 500 MG/50ML IV EMUL
INTRAVENOUS | Status: AC
  Filled 2018-04-04: qty 50

## 2018-04-04 MED ORDER — PROPOFOL 10 MG/ML IV BOLUS
INTRAVENOUS | Status: DC | PRN
  Administered 2018-04-04: 90 mg via INTRAVENOUS

## 2018-04-04 NOTE — Anesthesia Procedure Notes (Signed)
Date/Time: 04/04/2018 9:54 AM Performed by: Doreen Salvage, CRNA Pre-anesthesia Checklist: Patient identified, Emergency Drugs available, Suction available and Patient being monitored Patient Re-evaluated:Patient Re-evaluated prior to induction Oxygen Delivery Method: Nasal cannula Induction Type: IV induction Dental Injury: Teeth and Oropharynx as per pre-operative assessment  Comments: Nasal cannula with etCO2 monitoring

## 2018-04-04 NOTE — Op Note (Signed)
[facility] Gastroenterology Patient Name: Adrian Thompson Procedure Date: 04/04/2018 9:53 AM MRN: [id number] Account #: [id number] Date of Birth: [birth year] Admit Type: Outpatient Age: 72 Room: [facility] ENDO ROOM 1 Gender: Male Note Status: Finalized Procedure:            Colonoscopy Indications:          Screening for colorectal malignant neoplasm Providers:            Robert Bellow, MD Referring MD:         Kirstie Peri. Caryn Section, MD (Referring MD) Medicines:            Monitored Anesthesia Care Complications:        No immediate complications. Procedure:            Pre-Anesthesia Assessment:                       - Prior to the procedure, a History and Physical was                        performed, and patient medications, allergies and                        sensitivities were reviewed. The patient's tolerance of                        previous anesthesia was reviewed.                       - The risks and benefits of the procedure and the                        sedation options and risks were discussed with the                        patient. All questions were answered and informed                        consent was obtained.                       After obtaining informed consent, the colonoscope was                        passed under direct vision. Throughout the procedure,                        the patient's blood pressure, pulse, and oxygen                        saturations were monitored continuously. The                        Colonoscope was introduced through the anus and                        advanced to the the terminal ileum. The colonoscopy was                        performed without difficulty. The patient tolerated the  procedure well. The quality of the bowel preparation                        was excellent. Findings:      A 6 mm polyp was found in the ascending colon. The polyp was sessile.       Biopsies were taken with a  cold forceps for histology.      The retroflexed view of the distal rectum and anal verge was normal and       showed no anal or rectal abnormalities. Impression:           - One 6 mm polyp in the ascending colon. Biopsied.                       - The distal rectum and anal verge are normal on                        retroflexion view. Recommendation:       - Telephone endoscopist for pathology results in 1 week. Procedure Code(s):    --- Professional ---                       [phone], Colonoscopy, flexible; with biopsy, single or                        multiple Diagnosis Code(s):    --- Professional ---                       Z12.11, Encounter for screening for malignant neoplasm                        of colon                       D12.2, Benign neoplasm of ascending colon CPT copyright 2018 American Medical Association. All rights reserved. The codes documented in this report are preliminary and upon coder review may  be revised to meet current compliance requirements. Robert Bellow, MD 04/04/2018 10:17:34 AM This report has been signed electronically. Number of Addenda: 0 Note Initiated On: 04/04/2018 9:53 AM Scope Withdrawal Time: 0 hours 13 minutes 21 seconds  Total Procedure Duration: 0 hours 18 minutes 18 seconds       [facility]

## 2018-04-04 NOTE — Anesthesia Post-op Follow-up Note (Signed)
Anesthesia QCDR form completed.        

## 2018-04-04 NOTE — Transfer of Care (Signed)
Immediate Anesthesia Transfer of Care Note  Patient: Adrian Thompson  Procedure(s) Performed: Procedure(s): COLONOSCOPY WITH PROPOFOL (N/A)  Patient Location: PACU and Endoscopy Unit  Anesthesia Type:General  Level of Consciousness: sedated  Airway & Oxygen Therapy: Patient Spontanous Breathing and Patient connected to nasal cannula oxygen  Post-op Assessment: Report given to RN and Post -op Vital signs reviewed and stable  Post vital signs: Reviewed and stable  Last Vitals:  Vitals:   04/04/18 0844 04/04/18 1020  BP: (!) 159/89 105/69  Pulse: (!) 58 (!) 59  Resp: 16 15  Temp: (!) 36.3 C (!) 36.1 C  SpO2: 21% 97%    Complications: No apparent anesthesia complications

## 2018-04-04 NOTE — H&P (Signed)
Adrian Thompson [id number] July 23, 1945     HPI:  Healthy 72 y/o male for screening colonoscopy.  Small emesis at end of prep, otherwise, well tolerated.   Medications Prior to Admission  Medication Sig Dispense Refill Last Dose  . amLODipine (NORVASC) 10 MG tablet Take 1 tablet (10 mg total) by mouth daily. 90 tablet 3 04/03/2018 at Unknown time  . aspirin 81 MG tablet Take 1 tablet by mouth daily.   04/03/2018 at Unknown time  . atenolol (TENORMIN) 50 MG tablet TAKE 1 TABLET EVERY DAY 90 tablet 3 04/04/2018 at Unknown time  . KLOR-CON M20 20 MEQ tablet TAKE 1 TABLET EVERY DAY 90 tablet 3 04/03/2018 at Unknown time  . losartan-hydrochlorothiazide (HYZAAR) 100-25 MG tablet TAKE 1 TABLET EVERY DAY 90 tablet 3 04/03/2018 at Unknown time  . omeprazole (PRILOSEC) 20 MG capsule TAKE 1 CAPSULE EVERY DAY 90 capsule 4 04/03/2018 at Unknown time  . tamsulosin (FLOMAX) 0.4 MG CAPS capsule Take 1 capsule by mouth daily.   04/03/2018 at Unknown time  . finasteride (PROSCAR) 5 MG tablet Take 5 mg daily by mouth.   Taking  . glucosamine-chondroitin 500-400 MG tablet Take 1 tablet by mouth daily.   Taking  . glucose blood (ACCU-CHEK AVIVA PLUS) test strip Use as instructed to check blood sugar once daily. Dx type 2 diabetes. (E11.9) 100 each 4 Taking  . indomethacin (INDOCIN) 25 MG capsule Take 1 capsule (25 mg total) by mouth 3 (three) times daily with meals as needed. Up to 3 times a day (Patient not taking: Reported on 04/04/2018) 90 capsule 0 Not Taking at Unknown time  . Misc Natural Products (BLACK CHERRY CONCENTRATE PO) Take 1 tablet by mouth daily.   Taking  . montelukast (SINGULAIR) 10 MG tablet TAKE 1 TABLET EVERY DAY 90 tablet 4 Taking  . Multiple Vitamins-Minerals (MULTIVITAMIN ADULT PO) Take 1 tablet by mouth daily.   Taking  . nortriptyline (PAMELOR) 10 MG capsule Take 1 capsule (10 mg total) by mouth at bedtime. 30 capsule 1   . nortriptyline (PAMELOR) 25 MG capsule TAKE 1 CAPSULE EVERY DAY 90  capsule 4 Taking  . Omega-3 Fatty Acids (FISH OIL) 1000 MG CPDR Take 1 capsule by mouth daily.   Taking  . polyethylene glycol powder (GLYCOLAX/MIRALAX) powder   0 Taking  . pravastatin (PRAVACHOL) 20 MG tablet TAKE 1 TABLET EVERY DAY 90 tablet 4 Taking  . Triamcinolone Acetonide (NASACORT AQ NA) Place into the nose.   Taking   No Known Allergies Past Medical History:  Diagnosis Date  . Diabetes mellitus without complication ([place])    type 2  . Gout   . History of chicken pox   . History of measles   . History of mumps   . Hyperlipidemia   . Hypertension    Past Surgical History:  Procedure Laterality Date  . COCHLEAR IMPLANT Right 06/28/2016   Vira Blanco, MD [facility]  . COLONOSCOPY  2009  . EXTERNAL EAR SURGERY  2018  . TONSILLECTOMY    . UPPER GI ENDOSCOPY  2009  . UPPER GI ENDOSCOPY  03/04/2011   [facility]; Excision of multiple gastric polyps   Social History   Socioeconomic History  . Marital status: Married    Spouse name: Not on file  . Number of children: 0  . Years of education: Not on file  . Highest education level: Associate degree: occupational, Hotel manager, or vocational program  Occupational History  . Occupation: Retired  Scientific laboratory technician  .  Financial resource strain: Not hard at all  . Food insecurity:    Worry: Never true    Inability: Never true  . Transportation needs:    Medical: No    Non-medical: No  Tobacco Use  . Smoking status: Never Smoker  . Smokeless tobacco: Former Systems developer    Types: Snuff, Chew  Substance and Sexual Activity  . Alcohol use: No    Alcohol/week: 0.0 standard drinks  . Drug use: No  . Sexual activity: Not on file  Lifestyle  . Physical activity:    Days per week: 0 days    Minutes per session: 0 min  . Stress: Not at all  Relationships  . Social connections:    Talks on phone: Patient refused    Gets together: Patient refused    Attends religious service: Patient refused    Active member of club or organization: Patient  refused    Attends meetings of clubs or organizations: Patient refused    Relationship status: Patient refused  . Intimate partner violence:    Fear of current or ex partner: Patient refused    Emotionally abused: Patient refused    Physically abused: Patient refused    Forced sexual activity: Patient refused  Other Topics Concern  . Not on file  Social History Narrative  . Not on file   Social History   Social History Narrative  . Not on file     ROS: Negative.     PE: HEENT: Negative. Lungs: Clear. Cardio: RR.  Assessment/Plan:  Proceed with planned endoscopy.  Forest Gleason Leticia Mcdiarmid 04/04/2018

## 2018-04-04 NOTE — Anesthesia Preprocedure Evaluation (Signed)
Anesthesia Evaluation  Patient identified by MRN, date of birth, ID band Patient awake    Reviewed: Allergy & Precautions, H&P , NPO status , Patient's Chart, lab work & pertinent test results, reviewed documented beta blocker date and time   Airway Mallampati: II   Neck ROM: full    Dental  (+) Poor Dentition, Teeth Intact   Pulmonary neg pulmonary ROS,    Pulmonary exam normal        Cardiovascular Exercise Tolerance: Poor hypertension, On Medications negative cardio ROS Normal cardiovascular exam Rhythm:regular Rate:Normal     Neuro/Psych negative neurological ROS  negative psych ROS   GI/Hepatic Neg liver ROS, GERD  Medicated,  Endo/Other  negative endocrine ROSdiabetes, Well Controlled  Renal/GU negative Renal ROS  negative genitourinary   Musculoskeletal   Abdominal   Peds  Hematology negative hematology ROS (+)   Anesthesia Other Findings Past Medical History: No date: Diabetes mellitus without complication ([place])     Comment:  type 2 No date: Gout No date: History of chicken pox No date: History of measles No date: History of mumps No date: Hyperlipidemia No date: Hypertension Past Surgical History: 06/28/2016: COCHLEAR IMPLANT; Right     Comment:  Vira Blanco, MD [facility] 2009: COLONOSCOPY 2018: EXTERNAL EAR SURGERY No date: TONSILLECTOMY 2009: UPPER GI ENDOSCOPY 03/04/2011: UPPER GI ENDOSCOPY     Comment:  [place]; Excision of multiple gastric polyps BMI    Body Mass Index:  29.57 kg/m     Reproductive/Obstetrics negative OB ROS                             Anesthesia Physical Anesthesia Plan  ASA: III  Anesthesia Plan: General   Post-op Pain Management:    Induction:   PONV Risk Score and Plan:   Airway Management Planned:   Additional Equipment:   Intra-op Plan:   Post-operative Plan:   Informed Consent: I have reviewed the patients History and  Physical, chart, labs and discussed the procedure including the risks, benefits and alternatives for the proposed anesthesia with the patient or authorized representative who has indicated his/her understanding and acceptance.   Dental Advisory Given  Plan Discussed with: CRNA  Anesthesia Plan Comments:         Anesthesia Quick Evaluation

## 2018-04-05 ENCOUNTER — Encounter: Payer: Self-pay | Admitting: General Surgery

## 2018-04-05 LAB — SURGICAL PATHOLOGY

## 2018-04-05 NOTE — Anesthesia Postprocedure Evaluation (Signed)
Anesthesia Post Note  Patient: Adrian Thompson  Procedure(s) Performed: COLONOSCOPY WITH PROPOFOL (N/A )  Patient location during evaluation: PACU Anesthesia Type: General Level of consciousness: awake and alert Pain management: pain level controlled Vital Signs Assessment: post-procedure vital signs reviewed and stable Respiratory status: spontaneous breathing, nonlabored ventilation, respiratory function stable and patient connected to nasal cannula oxygen Cardiovascular status: blood pressure returned to baseline and stable Postop Assessment: no apparent nausea or vomiting Anesthetic complications: no     Last Vitals:  Vitals:   04/04/18 1030 04/04/18 1040  BP: 118/82 (!) 145/78  Pulse: (!) 59 (!) 53  Resp: 14 11  Temp:    SpO2: 99% 99%    Last Pain:  Vitals:   04/05/18 0820  TempSrc:   PainSc: 0-No pain                 Molli Barrows

## 2018-04-09 ENCOUNTER — Other Ambulatory Visit: Payer: Self-pay | Admitting: Family Medicine

## 2018-04-09 DIAGNOSIS — E876 Hypokalemia: Secondary | ICD-10-CM

## 2018-04-10 ENCOUNTER — Other Ambulatory Visit: Payer: Self-pay

## 2018-06-15 ENCOUNTER — Other Ambulatory Visit: Payer: Self-pay | Admitting: Family Medicine

## 2018-06-18 ENCOUNTER — Other Ambulatory Visit: Payer: Self-pay | Admitting: Family Medicine

## 2018-06-25 DIAGNOSIS — M9903 Segmental and somatic dysfunction of lumbar region: Secondary | ICD-10-CM | POA: Diagnosis not present

## 2018-06-25 DIAGNOSIS — M546 Pain in thoracic spine: Secondary | ICD-10-CM | POA: Diagnosis not present

## 2018-06-25 DIAGNOSIS — M545 Low back pain: Secondary | ICD-10-CM | POA: Diagnosis not present

## 2018-06-25 DIAGNOSIS — M9902 Segmental and somatic dysfunction of thoracic region: Secondary | ICD-10-CM | POA: Diagnosis not present

## 2018-06-27 DIAGNOSIS — M9902 Segmental and somatic dysfunction of thoracic region: Secondary | ICD-10-CM | POA: Diagnosis not present

## 2018-06-27 DIAGNOSIS — M546 Pain in thoracic spine: Secondary | ICD-10-CM | POA: Diagnosis not present

## 2018-06-27 DIAGNOSIS — M545 Low back pain: Secondary | ICD-10-CM | POA: Diagnosis not present

## 2018-06-27 DIAGNOSIS — M9903 Segmental and somatic dysfunction of lumbar region: Secondary | ICD-10-CM | POA: Diagnosis not present

## 2018-06-29 DIAGNOSIS — M546 Pain in thoracic spine: Secondary | ICD-10-CM | POA: Diagnosis not present

## 2018-06-29 DIAGNOSIS — M545 Low back pain: Secondary | ICD-10-CM | POA: Diagnosis not present

## 2018-06-29 DIAGNOSIS — M9903 Segmental and somatic dysfunction of lumbar region: Secondary | ICD-10-CM | POA: Diagnosis not present

## 2018-06-29 DIAGNOSIS — M9902 Segmental and somatic dysfunction of thoracic region: Secondary | ICD-10-CM | POA: Diagnosis not present

## 2018-07-02 DIAGNOSIS — M9902 Segmental and somatic dysfunction of thoracic region: Secondary | ICD-10-CM | POA: Diagnosis not present

## 2018-07-02 DIAGNOSIS — M545 Low back pain: Secondary | ICD-10-CM | POA: Diagnosis not present

## 2018-07-02 DIAGNOSIS — M546 Pain in thoracic spine: Secondary | ICD-10-CM | POA: Diagnosis not present

## 2018-07-02 DIAGNOSIS — M9903 Segmental and somatic dysfunction of lumbar region: Secondary | ICD-10-CM | POA: Diagnosis not present

## 2018-07-04 DIAGNOSIS — M9902 Segmental and somatic dysfunction of thoracic region: Secondary | ICD-10-CM | POA: Diagnosis not present

## 2018-07-04 DIAGNOSIS — M546 Pain in thoracic spine: Secondary | ICD-10-CM | POA: Diagnosis not present

## 2018-07-04 DIAGNOSIS — M9903 Segmental and somatic dysfunction of lumbar region: Secondary | ICD-10-CM | POA: Diagnosis not present

## 2018-07-04 DIAGNOSIS — M545 Low back pain: Secondary | ICD-10-CM | POA: Diagnosis not present

## 2018-07-06 DIAGNOSIS — M9902 Segmental and somatic dysfunction of thoracic region: Secondary | ICD-10-CM | POA: Diagnosis not present

## 2018-07-06 DIAGNOSIS — M546 Pain in thoracic spine: Secondary | ICD-10-CM | POA: Diagnosis not present

## 2018-07-06 DIAGNOSIS — M545 Low back pain: Secondary | ICD-10-CM | POA: Diagnosis not present

## 2018-07-06 DIAGNOSIS — M9903 Segmental and somatic dysfunction of lumbar region: Secondary | ICD-10-CM | POA: Diagnosis not present

## 2018-07-11 ENCOUNTER — Ambulatory Visit: Payer: Self-pay | Admitting: Family Medicine

## 2018-07-17 DIAGNOSIS — M546 Pain in thoracic spine: Secondary | ICD-10-CM | POA: Diagnosis not present

## 2018-07-17 DIAGNOSIS — M9903 Segmental and somatic dysfunction of lumbar region: Secondary | ICD-10-CM | POA: Diagnosis not present

## 2018-07-17 DIAGNOSIS — M545 Low back pain: Secondary | ICD-10-CM | POA: Diagnosis not present

## 2018-07-17 DIAGNOSIS — M9902 Segmental and somatic dysfunction of thoracic region: Secondary | ICD-10-CM | POA: Diagnosis not present

## 2018-07-19 ENCOUNTER — Other Ambulatory Visit: Payer: Self-pay | Admitting: Family Medicine

## 2018-07-19 DIAGNOSIS — M9903 Segmental and somatic dysfunction of lumbar region: Secondary | ICD-10-CM | POA: Diagnosis not present

## 2018-07-19 DIAGNOSIS — M545 Low back pain: Secondary | ICD-10-CM | POA: Diagnosis not present

## 2018-07-19 DIAGNOSIS — M546 Pain in thoracic spine: Secondary | ICD-10-CM | POA: Diagnosis not present

## 2018-07-19 DIAGNOSIS — M9902 Segmental and somatic dysfunction of thoracic region: Secondary | ICD-10-CM | POA: Diagnosis not present

## 2018-07-24 DIAGNOSIS — M546 Pain in thoracic spine: Secondary | ICD-10-CM | POA: Diagnosis not present

## 2018-07-24 DIAGNOSIS — M9903 Segmental and somatic dysfunction of lumbar region: Secondary | ICD-10-CM | POA: Diagnosis not present

## 2018-07-24 DIAGNOSIS — M545 Low back pain: Secondary | ICD-10-CM | POA: Diagnosis not present

## 2018-07-24 DIAGNOSIS — M9902 Segmental and somatic dysfunction of thoracic region: Secondary | ICD-10-CM | POA: Diagnosis not present

## 2018-07-25 DIAGNOSIS — Z45321 Encounter for adjustment and management of cochlear device: Secondary | ICD-10-CM | POA: Diagnosis not present

## 2018-07-25 DIAGNOSIS — H903 Sensorineural hearing loss, bilateral: Secondary | ICD-10-CM | POA: Diagnosis not present

## 2018-07-31 DIAGNOSIS — M9903 Segmental and somatic dysfunction of lumbar region: Secondary | ICD-10-CM | POA: Diagnosis not present

## 2018-07-31 DIAGNOSIS — M7542 Impingement syndrome of left shoulder: Secondary | ICD-10-CM | POA: Diagnosis not present

## 2018-07-31 DIAGNOSIS — M546 Pain in thoracic spine: Secondary | ICD-10-CM | POA: Diagnosis not present

## 2018-07-31 DIAGNOSIS — M545 Low back pain: Secondary | ICD-10-CM | POA: Diagnosis not present

## 2018-07-31 DIAGNOSIS — M9902 Segmental and somatic dysfunction of thoracic region: Secondary | ICD-10-CM | POA: Diagnosis not present

## 2018-08-03 ENCOUNTER — Telehealth: Payer: Self-pay | Admitting: Family Medicine

## 2018-08-03 NOTE — Telephone Encounter (Signed)
t went to [facility] on Tuesday and seen Dr,. Ronnald Ramp .  Pt received an injection in his shoulder and now his blood sugar is up some.  154- 3/12 am 256- 3/12 pm 159- 3/13 am  Please advise   CB# [phone]

## 2018-08-03 NOTE — Telephone Encounter (Signed)
Mrs. Kaucher advised.   Thanks,   -Mickel Baas

## 2018-08-03 NOTE — Telephone Encounter (Signed)
That's normal. The steroid will make his sugar a little high for 5-10 days. Don't need to do anything different unless it gets above 300.

## 2018-08-14 DIAGNOSIS — R972 Elevated prostate specific antigen [PSA]: Secondary | ICD-10-CM | POA: Diagnosis not present

## 2018-08-14 DIAGNOSIS — N401 Enlarged prostate with lower urinary tract symptoms: Secondary | ICD-10-CM | POA: Diagnosis not present

## 2018-08-24 DIAGNOSIS — H903 Sensorineural hearing loss, bilateral: Secondary | ICD-10-CM | POA: Diagnosis not present

## 2018-09-24 ENCOUNTER — Encounter: Payer: Self-pay | Admitting: Family Medicine

## 2018-10-02 ENCOUNTER — Ambulatory Visit: Payer: Self-pay | Admitting: Family Medicine

## 2018-10-10 ENCOUNTER — Other Ambulatory Visit: Payer: Self-pay

## 2018-10-10 ENCOUNTER — Encounter: Payer: Self-pay | Admitting: Family Medicine

## 2018-10-10 ENCOUNTER — Ambulatory Visit (INDEPENDENT_AMBULATORY_CARE_PROVIDER_SITE_OTHER): Payer: Medicare Other | Admitting: Family Medicine

## 2018-10-10 VITALS — BP 172/84 | HR 44 | Temp 98.5°F | Resp 16 | Wt 220.0 lb

## 2018-10-10 DIAGNOSIS — E119 Type 2 diabetes mellitus without complications: Secondary | ICD-10-CM | POA: Diagnosis not present

## 2018-10-10 DIAGNOSIS — I10 Essential (primary) hypertension: Secondary | ICD-10-CM | POA: Diagnosis not present

## 2018-10-10 LAB — POCT GLYCOSYLATED HEMOGLOBIN (HGB A1C)
Est. average glucose Bld gHb Est-mCnc: 151
Hemoglobin A1C: 6.9 % — AB (ref 4.0–5.6)

## 2018-10-10 NOTE — Patient Instructions (Addendum)
.   Please review the attached list of medications and notify my office if there are any errors.   . Please bring all of your medications to every appointment so we can make sure that our medication list is the same as yours.    Let me know if your home blood pressures stay over 140/90

## 2018-10-10 NOTE — Progress Notes (Signed)
Patient: Adrian Thompson Male    DOB: 27-Mar-1946   73 y.o.   MRN: [id number] Visit Date: 10/10/2018  Today's Provider: Lelon Huh, MD   Chief Complaint  Patient presents with  . Diabetes  . Hypertension   Subjective:     HPI  Diabetes Mellitus Type II, Follow-up:   Lab Results  Component Value Date   HGBA1C 6.4 (A) 01/08/2018   HGBA1C 6.6 09/29/2017   HGBA1C 6.4 04/03/2017    Last seen for diabetes 6 months ago.  Management since then includes no changes. He reports good compliance with treatment. He is not having side effects.  Current symptoms include none and have been stable. Home blood sugar records: fasting range: 100-130  Episodes of hypoglycemia? no   Current Insulin Regimen: none Most Recent Eye Exam:  06/2018 Weight trend: fluctuating a bit Prior visit with dietician: No Current exercise: none Current diet habits: well balanced  Pertinent Labs:    Component Value Date/Time   CHOL 153 04/03/2018 1053   TRIG 97 04/03/2018 1053   HDL 67 04/03/2018 1053   LDLCALC 67 04/03/2018 1053   LDLCALC 72 04/03/2017 1030   CREATININE 0.86 04/03/2018 1053   CREATININE 0.93 04/03/2017 1030    Wt Readings from Last 3 Encounters:  10/10/18 220 lb (99.8 kg)  04/04/18 218 lb (98.9 kg)  04/03/18 216 lb 6.4 oz (98.2 kg)    ------------------------------------------------------------------------  Hypertension, follow-up:  BP Readings from Last 3 Encounters:  10/10/18 (!) 172/84  04/04/18 (!) 145/78  04/03/18 (!) 144/84    He was last seen for hypertension 6 months ago.  BP at that visit was 144/84. Management since that visit includes counseling patient to work on weight loss and reducing sodium in diet. He reports good compliance with treatment. He is not having side effects.  He is not exercising. He is adherent to low salt diet.   Outside blood pressures are 130/80. He is experiencing none.  Patient denies chest pain, chest  pressure/discomfort, claudication, dyspnea, exertional chest pressure/discomfort, fatigue, irregular heart beat, lower extremity edema, near-syncope, orthopnea, palpitations, paroxysmal nocturnal dyspnea, syncope and tachypnea.   Cardiovascular risk factors include advanced age (older than 19 for men, 74 for women), diabetes mellitus, hypertension and male gender.  Use of agents associated with hypertension: NSAIDS.     Weight trend: fluctuating a bit Wt Readings from Last 3 Encounters:  10/10/18 220 lb (99.8 kg)  04/04/18 218 lb (98.9 kg)  04/03/18 216 lb 6.4 oz (98.2 kg)    Current diet: well balanced  ------------------------------------------------------------------------  States he has been having trouble with left shoulder, had steroid injection in March and a short course of meloxicam. Blood sugars were running in the 200s for awhile, but has been much better the last few weeks. Has been off of meloxicam.    No Known Allergies   Current Outpatient Medications:  .  amLODipine (NORVASC) 10 MG tablet, Take 1 tablet (10 mg total) by mouth daily., Disp: 90 tablet, Rfl: 3 .  aspirin 81 MG tablet, Take 1 tablet by mouth daily., Disp: , Rfl:  .  atenolol (TENORMIN) 50 MG tablet, TAKE 1 TABLET EVERY DAY, Disp: 90 tablet, Rfl: 4 .  finasteride (PROSCAR) 5 MG tablet, Take 5 mg daily by mouth., Disp: , Rfl:  .  glucosamine-chondroitin 500-400 MG tablet, Take 1 tablet by mouth daily., Disp: , Rfl:  .  glucose blood test strip, USE AS DIRECTED TO CHECK BLOOD SUGAR  EVERY DAY FOR TYPE 2 DIABETES E11.9, Disp: 100 each, Rfl: 4 .  indomethacin (INDOCIN) 25 MG capsule, Take 1 capsule (25 mg total) by mouth 3 (three) times daily with meals as needed. Up to 3 times a day, Disp: 90 capsule, Rfl: 0 .  losartan-hydrochlorothiazide (HYZAAR) 100-25 MG tablet, TAKE 1 TABLET EVERY DAY, Disp: 90 tablet, Rfl: 3 .  Misc Natural Products (BLACK CHERRY CONCENTRATE PO), Take 1 tablet by mouth daily., Disp: , Rfl:   .  montelukast (SINGULAIR) 10 MG tablet, TAKE 1 TABLET EVERY DAY, Disp: 90 tablet, Rfl: 4 .  Multiple Vitamins-Minerals (MULTIVITAMIN ADULT PO), Take 1 tablet by mouth daily., Disp: , Rfl:  .  Omega-3 Fatty Acids (FISH OIL) 1000 MG CPDR, Take 1 capsule by mouth daily., Disp: , Rfl:  .  omeprazole (PRILOSEC) 20 MG capsule, TAKE 1 CAPSULE EVERY DAY, Disp: 90 capsule, Rfl: 4 .  potassium chloride SA (K-DUR,KLOR-CON) 20 MEQ tablet, TAKE 1 TABLET EVERY DAY, Disp: 90 tablet, Rfl: 4 .  pravastatin (PRAVACHOL) 20 MG tablet, TAKE 1 TABLET EVERY DAY, Disp: 90 tablet, Rfl: 4 .  tamsulosin (FLOMAX) 0.4 MG CAPS capsule, Take 1 capsule by mouth daily., Disp: , Rfl:  .  Triamcinolone Acetonide (NASACORT AQ NA), Place into the nose., Disp: , Rfl:  .  meloxicam (MOBIC) 15 MG tablet, Take 1 tablet by mouth daily., Disp: , Rfl:   Review of Systems  Constitutional: Negative for appetite change, chills and fever.  Respiratory: Negative for chest tightness, shortness of breath and wheezing.   Cardiovascular: Negative for chest pain and palpitations.  Gastrointestinal: Negative for abdominal pain, nausea and vomiting.    Social History   Tobacco Use  . Smoking status: Never Smoker  . Smokeless tobacco: Former Systems developer    Types: Snuff, Chew  Substance Use Topics  . Alcohol use: No    Alcohol/week: 0.0 standard drinks      Objective:   BP (!) 172/84   Pulse (!) 44   Temp 98.5 F (36.9 C) (Oral)   Resp 16   Wt 220 lb (99.8 kg)   SpO2 98% Comment: room air  BMI 29.84 kg/m  Vitals:   10/10/18 0829 10/10/18 0839  BP: (!) 162/88 (!) 172/84  Pulse: (!) 44   Resp: 16   Temp: 98.5 F (36.9 C)   TempSrc: Oral   SpO2: 98%   Weight: 220 lb (99.8 kg)      Physical Exam  General appearance: alert, well developed, well nourished, cooperative and in no distress Head: Normocephalic, without obvious abnormality, atraumatic Respiratory: Respirations even and unlabored, normal respiratory rate  Extremities: No gross deformities Skin: Skin color, texture, turgor normal. No rashes seen  Psych: Appropriate mood and affect. Neurologic: Mental status: Alert, oriented to person, place, and time, thought content appropriate.  Results for orders placed or performed in visit on 10/10/18  POCT HgB A1C  Result Value Ref Range   Hemoglobin A1C 6.9 (A) 4.0 - 5.6 %   HbA1c POC (<> result, manual entry)     HbA1c, POC (prediabetic range)     HbA1c, POC (controlled diabetic range)     Est. average glucose Bld gHb Est-mCnc 151        Assessment & Plan    1. Type 2 diabetes mellitus without complication, without long-term current use of insulin (HCC) Well controlled with diet. Check a1c Q6 mos. Call if home sugars >300 - POCT HgB A1C  2. Essential (primary) hypertension Elevated today likely  due to missing dose of medication, but he reports well controlled blood pressure at home. Is to continue checking at least once a week.      Lelon Huh, MD  [facility]

## 2018-11-02 ENCOUNTER — Other Ambulatory Visit: Payer: Self-pay | Admitting: Family Medicine

## 2018-11-02 DIAGNOSIS — I10 Essential (primary) hypertension: Secondary | ICD-10-CM

## 2018-11-20 DIAGNOSIS — R972 Elevated prostate specific antigen [PSA]: Secondary | ICD-10-CM | POA: Diagnosis not present

## 2018-11-20 DIAGNOSIS — Z125 Encounter for screening for malignant neoplasm of prostate: Secondary | ICD-10-CM | POA: Diagnosis not present

## 2019-01-09 DIAGNOSIS — D692 Other nonthrombocytopenic purpura: Secondary | ICD-10-CM | POA: Diagnosis not present

## 2019-01-09 DIAGNOSIS — S80861A Insect bite (nonvenomous), right lower leg, initial encounter: Secondary | ICD-10-CM | POA: Diagnosis not present

## 2019-01-09 DIAGNOSIS — L298 Other pruritus: Secondary | ICD-10-CM | POA: Diagnosis not present

## 2019-01-09 DIAGNOSIS — S80862A Insect bite (nonvenomous), left lower leg, initial encounter: Secondary | ICD-10-CM | POA: Diagnosis not present

## 2019-01-09 DIAGNOSIS — L538 Other specified erythematous conditions: Secondary | ICD-10-CM | POA: Diagnosis not present

## 2019-01-09 DIAGNOSIS — L820 Inflamed seborrheic keratosis: Secondary | ICD-10-CM | POA: Diagnosis not present

## 2019-01-30 ENCOUNTER — Other Ambulatory Visit: Payer: Self-pay | Admitting: Family Medicine

## 2019-02-18 DIAGNOSIS — Z23 Encounter for immunization: Secondary | ICD-10-CM | POA: Diagnosis not present

## 2019-02-24 ENCOUNTER — Encounter: Payer: Self-pay | Admitting: Family Medicine

## 2019-03-21 ENCOUNTER — Other Ambulatory Visit: Payer: Self-pay | Admitting: Family Medicine

## 2019-04-08 NOTE — Progress Notes (Signed)
Subjective:   Adrian Thompson is a 73 y.o. male who presents for Medicare Annual/Subsequent preventive examination.  Review of Systems:  N/A  Cardiac Risk Factors include: advanced age (>56men, >66 women);diabetes mellitus;dyslipidemia;hypertension;male gender     Objective:    Vitals: BP (!) 162/78 (BP Location: Right Arm)   Pulse 61   Temp 99.1 F (37.3 C) (Oral)   Ht 6' (1.829 m)   Wt 221 lb 12.8 oz (100.6 kg)   BMI 30.08 kg/m   Body mass index is 30.08 kg/m.  Advanced Directives 04/09/2019 04/04/2018 04/03/2018 04/03/2017  Does Patient Have a Medical Advance Directive? Yes Yes Yes Yes  Type of Paramedic of [place];Living will [place];Living will [place];Living will [place];Living will  Copy of [place] in Chart? Yes - validated most recent copy scanned in chart (See row information) No - copy requested Yes - validated most recent copy scanned in chart (See row information) No - copy requested    Tobacco Social History   Tobacco Use  Smoking Status Never Smoker  Smokeless Tobacco Former Systems developer  . Types: Snuff, Chew     Counseling given: Not Answered   Clinical Intake:  Pre-visit preparation completed: Yes  Pain : No/denies pain Pain Score: 0-No pain     Nutritional Status: BMI > 30  Obese Nutritional Risks: None Diabetes: Yes  How often do you need to have someone help you when you read instructions, pamphlets, or other written materials from your doctor or pharmacy?: 1 - Never   Diabetes:  Is the patient diabetic?  Yes type 2 If diabetic, was a CBG obtained today?  No  Did the patient bring in their glucometer from home?  No  How often do you monitor your CBG's? Once or twice daily.   Financial Strains and Diabetes Management:  Are you having any financial strains with the device, your supplies or your medication? No .  Does the patient want  to be seen by Chronic Care Management for management of their diabetes?  No  Would the patient like to be referred to a Nutritionist or for Diabetic Management?  No   Diabetic Exams:  Diabetic Eye Exam: Completed 06/2018 per pt. Requested records to be faxed to clinic.  Diabetic Foot Exam: Completed 04/03/17. Pt has been advised about the importance in completing this exam. Note made to follow up on this at today's OV.   Interpreter Needed?: No  Information entered by :: [facility], LPN  Past Medical History:  Diagnosis Date  . Diabetes mellitus without complication ([place])    type 2  . Gout   . History of chicken pox   . History of measles   . History of mumps   . Hyperlipidemia   . Hypertension    Past Surgical History:  Procedure Laterality Date  . COCHLEAR IMPLANT Right 06/28/2016   Vira Blanco, MD [facility]  . COLONOSCOPY  2009  . COLONOSCOPY WITH PROPOFOL N/A 04/04/2018   Procedure: COLONOSCOPY WITH PROPOFOL;  Surgeon: Robert Bellow, MD;  Location: [facility] ENDOSCOPY;  Service: Endoscopy;  Laterality: N/A;  . EXTERNAL EAR SURGERY  2018  . TONSILLECTOMY    . UPPER GI ENDOSCOPY  2009  . UPPER GI ENDOSCOPY  03/04/2011   [facility]; Excision of multiple gastric polyps   Family History  Problem Relation Age of Onset  . Hypertension Mother   . Hypertension Sister    Social History  Socioeconomic History  . Marital status: Married    Spouse name: Not on file  . Number of children: 0  . Years of education: Not on file  . Highest education level: Associate degree: occupational, Hotel manager, or vocational program  Occupational History  . Occupation: Retired  Scientific laboratory technician  . Financial resource strain: Not hard at all  . Food insecurity    Worry: Never true    Inability: Never true  . Transportation needs    Medical: No    Non-medical: No  Tobacco Use  . Smoking status: Never Smoker  . Smokeless tobacco: Former Systems developer    Types: Snuff, Chew  Substance and Sexual Activity   . Alcohol use: No    Alcohol/week: 0.0 standard drinks  . Drug use: No  . Sexual activity: Not on file  Lifestyle  . Physical activity    Days per week: 0 days    Minutes per session: 0 min  . Stress: Not at all  Relationships  . Social Herbalist on phone: Patient refused    Gets together: Patient refused    Attends religious service: Patient refused    Active member of club or organization: Patient refused    Attends meetings of clubs or organizations: Patient refused    Relationship status: Patient refused  Other Topics Concern  . Not on file  Social History Narrative  . Not on file    Outpatient Encounter Medications as of 04/09/2019  Medication Sig  . amLODipine (NORVASC) 10 MG tablet TAKE 1 TABLET (10 MG TOTAL) BY MOUTH DAILY. (Patient taking differently: Take 10 mg by mouth daily. Unsure dose- paper copy has 5 mg)  . aspirin 81 MG tablet Take 1 tablet by mouth daily.  Marland Kitchen atenolol (TENORMIN) 50 MG tablet TAKE 1 TABLET EVERY DAY  . finasteride (PROSCAR) 5 MG tablet Take 5 mg daily by mouth.  Marland Kitchen glucosamine-chondroitin 500-400 MG tablet Take 1 tablet by mouth daily.  Marland Kitchen glucose blood test strip USE AS DIRECTED TO CHECK BLOOD SUGAR EVERY DAY FOR TYPE 2 DIABETES E11.9  . indomethacin (INDOCIN) 25 MG capsule Take 1 capsule (25 mg total) by mouth 3 (three) times daily with meals as needed. Up to 3 times a day  . losartan-hydrochlorothiazide (HYZAAR) 100-25 MG tablet TAKE 1 TABLET EVERY DAY  . meloxicam (MOBIC) 15 MG tablet Take 1 tablet by mouth daily. As needed  . Misc Natural Products (BLACK CHERRY CONCENTRATE PO) Take 1 tablet by mouth daily.  . montelukast (SINGULAIR) 10 MG tablet TAKE 1 TABLET EVERY DAY  . Multiple Vitamins-Minerals (MULTIVITAMIN ADULT PO) Take 1 tablet by mouth daily.  . Omega-3 Fatty Acids (FISH OIL) 1000 MG CPDR Take 1 capsule by mouth daily.  Marland Kitchen omeprazole (PRILOSEC) 20 MG capsule TAKE 1 CAPSULE EVERY DAY  . potassium chloride SA  (K-DUR,KLOR-CON) 20 MEQ tablet TAKE 1 TABLET EVERY DAY  . pravastatin (PRAVACHOL) 20 MG tablet TAKE 1 TABLET EVERY DAY  . tamsulosin (FLOMAX) 0.4 MG CAPS capsule Take 1 capsule by mouth daily.  . Triamcinolone Acetonide (NASACORT AQ NA) Place into the nose daily.    No facility-administered encounter medications on file as of 04/09/2019.     Activities of Daily Living In your present state of health, do you have any difficulty performing the following activities: 04/09/2019  Hearing? Y  Comment Wears a hearing aid in left ear and has a cochlear implant for right ear.  Vision? N  Difficulty concentrating or making decisions?  N  Walking or climbing stairs? N  Dressing or bathing? N  Doing errands, shopping? N  Preparing Food and eating ? N  Using the Toilet? N  In the past six months, have you accidently leaked urine? N  Do you have problems with loss of bowel control? N  Managing your Medications? N  Managing your Finances? N  Housekeeping or managing your Housekeeping? N  Some recent data might be hidden    Patient Care Team: Birdie Sons, MD as PCP - General (Family Medicine) Bary Castilla, Forest Gleason, MD (General Surgery) Lamonte Richer, [place] (Urology) Oneta Rack, MD (Dermatology) Throat, [place] [facility]   Assessment:   This is a routine wellness examination for Marcelino.  Exercise Activities and Dietary recommendations Current Exercise Habits: The patient does not participate in regular exercise at present, Exercise limited by: orthopedic condition(s)  Goals    . Exercise 3x per week (30 min per time)     Recommend to start exercising three times a week for 30 minutes.        Fall Risk Fall Risk  04/09/2019 04/10/2018 04/03/2018 03/27/2018 04/03/2017  Falls in the past year? 0 0 0 0 No  Comment - Emmi Telephone Survey: data to providers prior to load - - -  Number falls in past yr: 0 - - - -  Injury with Fall? 0 - - - -   FALL RISK PREVENTION PERTAINING  TO THE HOME:  Any stairs in or around the home? Yes  If so, are there any without handrails? No   Home free of loose throw rugs in walkways, pet beds, electrical cords, etc? Yes  Adequate lighting in your home to reduce risk of falls? Yes   ASSISTIVE DEVICES UTILIZED TO PREVENT FALLS:  Life alert? No  Use of a cane, walker or w/c? No  Grab bars in the bathroom? No  Shower chair or bench in shower? Yes  Elevated toilet seat or a handicapped toilet? Yes    TIMED UP AND GO:  Was the test performed? No .    Depression Screen PHQ 2/9 Scores 04/09/2019 04/03/2018 04/03/2017 04/01/2016  PHQ - 2 Score 0 0 0 0  PHQ- 9 Score - - - -    Cognitive Function: Declined today.         Immunization History  Administered Date(s) Administered  . Influenza, High Dose Seasonal PF 03/02/2015, 03/07/2016, 02/23/2018  . Influenza-Unspecified 03/09/2017, 02/13/2019  . Pneumococcal Conjugate-13 02/26/2014  . Pneumococcal Polysaccharide-23 02/25/2013  . Td 03/20/2001  . Tdap 02/14/2011  . Zoster 06/19/2007  . Zoster Recombinat (Shingrix) 08/31/2017, 11/08/2017    Qualifies for Shingles Vaccine? Completed series  Tdap: Up to date  Flu Vaccine: Up to date  Pneumococcal Vaccine: Completed series  Screening Tests Health Maintenance  Topic Date Due  . FOOT EXAM  04/03/2018  . OPHTHALMOLOGY EXAM  06/26/2018  . HEMOGLOBIN A1C  04/12/2019  . TETANUS/TDAP  02/13/2021  . COLONOSCOPY  04/05/2023  . INFLUENZA VACCINE  Completed  . PNA vac Low Risk Adult  Completed  . Hepatitis C Screening  Addressed   Cancer Screenings:  Colorectal Screening: Completed 04/04/18. Repeat every 5 years.  Lung Cancer Screening: (Low Dose CT Chest recommended if Age 24-80 years, 30 pack-year currently smoking OR have quit w/in 15years.) does not qualify.   Additional Screening:  Hepatitis C Screening: Up to date  Dental Screening: Recommended annual dental exams for proper oral hygiene  Community  Resource Referral:  CRR required this visit?  No        Plan:  I have personally reviewed and addressed the Medicare Annual Wellness questionnaire and have noted the following in the patient's chart:  A. Medical and social history B. Use of alcohol, tobacco or illicit drugs  C. Current medications and supplements D. Functional ability and status E.  Nutritional status F.  Physical activity G. Advance directives H. List of other physicians I.  Hospitalizations, surgeries, and ER visits in previous 12 months J.  [place] such as hearing and vision if needed, cognitive and depression L. Referrals and appointments   In addition, I have reviewed and discussed with patient certain preventive protocols, quality metrics, and best practice recommendations. A written personalized care plan for preventive services as well as general preventive health recommendations were provided to patient.   Glendora Score, [place]  [id number] Nurse Health Advisor  Nurse Notes: Pt needs a diabetic foot exam today. Pt states he had an eye exam 06/2018. Requested records to be sent to clinic.

## 2019-04-09 ENCOUNTER — Ambulatory Visit (INDEPENDENT_AMBULATORY_CARE_PROVIDER_SITE_OTHER): Payer: Medicare Other

## 2019-04-09 ENCOUNTER — Ambulatory Visit (INDEPENDENT_AMBULATORY_CARE_PROVIDER_SITE_OTHER): Payer: Medicare Other | Admitting: Family Medicine

## 2019-04-09 ENCOUNTER — Encounter: Payer: Self-pay | Admitting: Family Medicine

## 2019-04-09 ENCOUNTER — Other Ambulatory Visit: Payer: Self-pay

## 2019-04-09 VITALS — BP 164/76 | HR 61 | Temp 99.1°F | Ht 72.0 in | Wt 221.8 lb

## 2019-04-09 VITALS — BP 142/80 | HR 61 | Temp 99.1°F | Ht 72.0 in | Wt 221.8 lb

## 2019-04-09 DIAGNOSIS — N401 Enlarged prostate with lower urinary tract symptoms: Secondary | ICD-10-CM

## 2019-04-09 DIAGNOSIS — I10 Essential (primary) hypertension: Secondary | ICD-10-CM | POA: Diagnosis not present

## 2019-04-09 DIAGNOSIS — K219 Gastro-esophageal reflux disease without esophagitis: Secondary | ICD-10-CM | POA: Diagnosis not present

## 2019-04-09 DIAGNOSIS — E785 Hyperlipidemia, unspecified: Secondary | ICD-10-CM

## 2019-04-09 DIAGNOSIS — N138 Other obstructive and reflux uropathy: Secondary | ICD-10-CM | POA: Diagnosis not present

## 2019-04-09 DIAGNOSIS — Z0000 Encounter for general adult medical examination without abnormal findings: Secondary | ICD-10-CM | POA: Diagnosis not present

## 2019-04-09 DIAGNOSIS — E119 Type 2 diabetes mellitus without complications: Secondary | ICD-10-CM | POA: Diagnosis not present

## 2019-04-09 NOTE — Patient Instructions (Addendum)
Adrian Thompson , Thank you for taking time to come for your Medicare Wellness Visit. I appreciate your ongoing commitment to your health goals. Please review the following plan we discussed and let me know if I can assist you in the future.   Screening recommendations/referrals: Colonoscopy: Up to date, due 03/2023 Recommended yearly ophthalmology/optometry visit for glaucoma screening and checkup Recommended yearly dental visit for hygiene and checkup  Vaccinations: Influenza vaccine: Up to date Pneumococcal vaccine: Completed series Tdap vaccine: Up to date, due 01/2021 Shingles vaccine: Completed series    Advanced directives: Currently on file  Conditions/risks identified: Recommend to start walking 3 days a week for at least 30 minutes at a time.   Next appointment: 10:00 AM today with Dr Caryn Section. Declined scheduling CPE and AWV for 2021 at this time.  Preventive Care 72 Years and Older, Male Preventive care refers to lifestyle choices and visits with your health care provider that can promote health and wellness. What does preventive care include?  A yearly physical exam. This is also called an annual well check.  Dental exams once or twice a year.  Routine eye exams. Ask your health care provider how often you should have your eyes checked.  Personal lifestyle choices, including:  Daily care of your teeth and gums.  Regular physical activity.  Eating a healthy diet.  Avoiding tobacco and drug use.  Limiting alcohol use.  Practicing safe sex.  Taking low doses of aspirin every day.  Taking vitamin and mineral supplements as recommended by your health care provider. What happens during an annual well check? The services and screenings done by your health care provider during your annual well check will depend on your age, overall health, lifestyle risk factors, and family history of disease. Counseling  Your health care provider may ask you questions about your:   Alcohol use.  Tobacco use.  Drug use.  Emotional well-being.  Home and relationship well-being.  Sexual activity.  Eating habits.  History of falls.  Memory and ability to understand (cognition).  Work and work Statistician. Screening  You may have the following tests or measurements:  Height, weight, and BMI.  Blood pressure.  Lipid and cholesterol levels. These may be checked every 5 years, or more frequently if you are over 50 years old.  Skin check.  Lung cancer screening. You may have this screening every year starting at age 63 if you have a 30-pack-year history of smoking and currently smoke or have quit within the past 15 years.  Fecal occult blood test (FOBT) of the stool. You may have this test every year starting at age 19.  Flexible sigmoidoscopy or colonoscopy. You may have a sigmoidoscopy every 5 years or a colonoscopy every 10 years starting at age 11.  Prostate cancer screening. Recommendations will vary depending on your family history and other risks.  Hepatitis C blood test.  Hepatitis B blood test.  Sexually transmitted disease (STD) testing.  Diabetes screening. This is done by checking your blood sugar (glucose) after you have not eaten for a while (fasting). You may have this done every 1-3 years.  Abdominal aortic aneurysm (AAA) screening. You may need this if you are a current or former smoker.  Osteoporosis. You may be screened starting at age 23 if you are at high risk. Talk with your health care provider about your test results, treatment options, and if necessary, the need for more tests. Vaccines  Your health care provider may recommend certain vaccines, such  as:  Influenza vaccine. This is recommended every year.  Tetanus, diphtheria, and acellular pertussis (Tdap, Td) vaccine. You may need a Td booster every 10 years.  Zoster vaccine. You may need this after age 11.  Pneumococcal 13-valent conjugate (PCV13) vaccine. One dose is  recommended after age 36.  Pneumococcal polysaccharide (PPSV23) vaccine. One dose is recommended after age 64. Talk to your health care provider about which screenings and vaccines you need and how often you need them. This information is not intended to replace advice given to you by your health care provider. Make sure you discuss any questions you have with your health care provider. Document Released: 06/05/2015 Document Revised: 01/27/2016 Document Reviewed: 03/10/2015 Elsevier Interactive Patient Education  2017 [place] Prevention in the Home Falls can cause injuries. They can happen to people of all ages. There are many things you can do to make your home safe and to help prevent falls. What can I do on the outside of my home?  Regularly fix the edges of walkways and driveways and fix any cracks.  Remove anything that might make you trip as you walk through a door, such as a raised step or threshold.  Trim any bushes or trees on the path to your home.  Use bright outdoor lighting.  Clear any walking paths of anything that might make someone trip, such as rocks or tools.  Regularly check to see if handrails are loose or broken. Make sure that both sides of any steps have handrails.  Any raised decks and porches should have guardrails on the edges.  Have any leaves, snow, or ice cleared regularly.  Use sand or salt on walking paths during winter.  Clean up any spills in your garage right away. This includes oil or grease spills. What can I do in the bathroom?  Use night lights.  Install grab bars by the toilet and in the tub and shower. Do not use towel bars as grab bars.  Use non-skid mats or decals in the tub or shower.  If you need to sit down in the shower, use a plastic, non-slip stool.  Keep the floor dry. Clean up any water that spills on the floor as soon as it happens.  Remove soap buildup in the tub or shower regularly.  Attach bath mats  securely with double-sided non-slip rug tape.  Do not have throw rugs and other things on the floor that can make you trip. What can I do in the bedroom?  Use night lights.  Make sure that you have a light by your bed that is easy to reach.  Do not use any sheets or blankets that are too big for your bed. They should not hang down onto the floor.  Have a firm chair that has side arms. You can use this for support while you get dressed.  Do not have throw rugs and other things on the floor that can make you trip. What can I do in the kitchen?  Clean up any spills right away.  Avoid walking on wet floors.  Keep items that you use a lot in easy-to-reach places.  If you need to reach something above you, use a strong step stool that has a grab bar.  Keep electrical cords out of the way.  Do not use floor polish or wax that makes floors slippery. If you must use wax, use non-skid floor wax.  Do not have throw rugs and other things on the  floor that can make you trip. What can I do with my stairs?  Do not leave any items on the stairs.  Make sure that there are handrails on both sides of the stairs and use them. Fix handrails that are broken or loose. Make sure that handrails are as long as the stairways.  Check any carpeting to make sure that it is firmly attached to the stairs. Fix any carpet that is loose or worn.  Avoid having throw rugs at the top or bottom of the stairs. If you do have throw rugs, attach them to the floor with carpet tape.  Make sure that you have a light switch at the top of the stairs and the bottom of the stairs. If you do not have them, ask someone to add them for you. What else can I do to help prevent falls?  Wear shoes that:  Do not have high heels.  Have rubber bottoms.  Are comfortable and fit you well.  Are closed at the toe. Do not wear sandals.  If you use a stepladder:  Make sure that it is fully opened. Do not climb a closed  stepladder.  Make sure that both sides of the stepladder are locked into place.  Ask someone to hold it for you, if possible.  Clearly mark and make sure that you can see:  Any grab bars or handrails.  First and last steps.  Where the edge of each step is.  Use tools that help you move around (mobility aids) if they are needed. These include:  Canes.  Walkers.  Scooters.  Crutches.  Turn on the lights when you go into a dark area. Replace any light bulbs as soon as they burn out.  Set up your furniture so you have a clear path. Avoid moving your furniture around.  If any of your floors are uneven, fix them.  If there are any pets around you, be aware of where they are.  Review your medicines with your doctor. Some medicines can make you feel dizzy. This can increase your chance of falling. Ask your doctor what other things that you can do to help prevent falls. This information is not intended to replace advice given to you by your health care provider. Make sure you discuss any questions you have with your health care provider. Document Released: 03/05/2009 Document Revised: 10/15/2015 Document Reviewed: 06/13/2014 Elsevier Interactive Patient Education  2017 [employer].

## 2019-04-09 NOTE — Progress Notes (Signed)
Patient: Adrian Thompson Male    DOB: 04/27/1946   73 y.o.   MRN: [id number] Visit Date: 04/09/2019  Today's Provider: Lelon Huh, MD   Chief Complaint  Patient presents with  . Diabetes  . Hypertension   Subjective:     Patient had a AWE with [facility] prior to appointment today.   HPI  Diabetes Mellitus Type II, Follow-up:   Recent Labs       Lab Results  Component Value Date   HGBA1C 6.4 (A) 01/08/2018   HGBA1C 6.6 09/29/2017   HGBA1C 6.4 04/03/2017      Last seen for diabetes 6 months ago.  Management since then includes no changes. He reports good compliance with treatment. He is not having side effects.  Current symptoms include none  Home blood sugar records: fasting range: 114 this morning  Episodes of hypoglycemia? no              Current Insulin Regimen: none Most Recent Eye Exam:  06/26/2017 Weight trend: stable Prior visit with dietician: No Current exercise: none Current diet habits: unhealthy since COVID  Pertinent Labs: Lab Results  Component Value Date   CHOL 153 04/03/2018   HDL 67 04/03/2018   LDLCALC 67 04/03/2018   TRIG 97 04/03/2018   CHOLHDL 2.3 04/03/2018   Wt Readings from Last 3 Encounters:  04/09/19 221 lb 12.8 oz (100.6 kg)  04/09/19 221 lb 12.8 oz (100.6 kg)  10/10/18 220 lb (99.8 kg)     ------------------------------------------------------------------------  Hypertension, follow-up:     BP Readings from Last 3 Encounters:  10/10/18 (!) 172/84  04/04/18 (!) 145/78  04/03/18 (!) 144/84    He was last seen for hypertension 6 months ago.  BP at that visit was 172/84. Management since that visit includes advised patient to monitor BP at home and continue medication. He reports good compliance with treatment. He is not having side effects.  He is not exercising. He is adherent to low salt diet.   Outside blood pressures are being checked occasionally. He is experiencing none.  Patient denies  chest pain, chest pressure/discomfort, claudication, dyspnea, exertional chest pressure/discomfort, fatigue, irregular heart beat, lower extremity edema, near-syncope, orthopnea, palpitations, paroxysmal nocturnal dyspnea, syncope and tachypnea.   Cardiovascular risk factors include advanced age (older than 5 for men, 26 for women), diabetes mellitus, hypertension and male gender.  Use of agents associated with hypertension: NSAIDS.                Weight trend: stable  BP Readings from Last 3 Encounters:  04/09/19 (!) 162/78  04/09/19 (!) 164/76  10/10/18 (!) 172/84   Current diet: unhealthy since COVID  No Known Allergies   Current Outpatient Medications:  .  amLODipine (NORVASC) 10 MG tablet, TAKE 1 TABLET (10 MG TOTAL) BY MOUTH DAILY. (Patient taking differently: Take 10 mg by mouth daily. Unsure dose- paper copy has 5 mg), Disp: 90 tablet, Rfl: 3 .  aspirin 81 MG tablet, Take 1 tablet by mouth daily., Disp: , Rfl:  .  atenolol (TENORMIN) 50 MG tablet, TAKE 1 TABLET EVERY DAY, Disp: 90 tablet, Rfl: 4 .  finasteride (PROSCAR) 5 MG tablet, Take 5 mg daily by mouth., Disp: , Rfl:  .  glucosamine-chondroitin 500-400 MG tablet, Take 1 tablet by mouth daily., Disp: , Rfl:  .  glucose blood test strip, USE AS DIRECTED TO CHECK BLOOD SUGAR EVERY DAY FOR TYPE 2 DIABETES E11.9, Disp: 100 each, Rfl:  4 .  indomethacin (INDOCIN) 25 MG capsule, Take 1 capsule (25 mg total) by mouth 3 (three) times daily with meals as needed. Up to 3 times a day, Disp: 90 capsule, Rfl: 0 .  losartan-hydrochlorothiazide (HYZAAR) 100-25 MG tablet, TAKE 1 TABLET EVERY DAY, Disp: 90 tablet, Rfl: 3 .  meloxicam (MOBIC) 15 MG tablet, Take 1 tablet by mouth daily. As needed, Disp: , Rfl:  .  Misc Natural Products (BLACK CHERRY CONCENTRATE PO), Take 1 tablet by mouth daily., Disp: , Rfl:  .  montelukast (SINGULAIR) 10 MG tablet, TAKE 1 TABLET EVERY DAY, Disp: 90 tablet, Rfl: 4 .  Multiple Vitamins-Minerals (MULTIVITAMIN  ADULT PO), Take 1 tablet by mouth daily., Disp: , Rfl:  .  Omega-3 Fatty Acids (FISH OIL) 1000 MG CPDR, Take 1 capsule by mouth daily., Disp: , Rfl:  .  omeprazole (PRILOSEC) 20 MG capsule, TAKE 1 CAPSULE EVERY DAY, Disp: 90 capsule, Rfl: 4 .  potassium chloride SA (K-DUR,KLOR-CON) 20 MEQ tablet, TAKE 1 TABLET EVERY DAY, Disp: 90 tablet, Rfl: 4 .  pravastatin (PRAVACHOL) 20 MG tablet, TAKE 1 TABLET EVERY DAY, Disp: 90 tablet, Rfl: 4 .  tamsulosin (FLOMAX) 0.4 MG CAPS capsule, Take 1 capsule by mouth daily., Disp: , Rfl:  .  Triamcinolone Acetonide (NASACORT AQ NA), Place into the nose daily. , Disp: , Rfl:   Review of Systems  Constitutional: Negative.   HENT: Negative.   Eyes: Negative.   Respiratory: Negative.   Cardiovascular: Negative.   Gastrointestinal: Negative.   Endocrine: Negative.   Genitourinary: Negative.   Musculoskeletal: Negative.   Skin: Negative.   Allergic/Immunologic: Negative.   Neurological: Negative.   Hematological: Negative.   Psychiatric/Behavioral: Negative.     Social History   Tobacco Use  . Smoking status: Never Smoker  . Smokeless tobacco: Former Systems developer    Types: Snuff, Chew  Substance Use Topics  . Alcohol use: No    Alcohol/week: 0.0 standard drinks      Objective:   BP (!) 162/78 (BP Location: Right Arm, Patient Position: Sitting, Cuff Size: Normal)   Pulse 61   Temp 99.1 F (37.3 C) (Oral)   Ht 6' (1.829 m)   Wt 221 lb 12.8 oz (100.6 kg)   BMI 30.08 kg/m  Vitals:   04/09/19 0933 04/09/19 1047  BP: (!) 162/78 (!) 142/80  Pulse: 61   Temp: 99.1 F (37.3 C)   TempSrc: Oral   Weight: 221 lb 12.8 oz (100.6 kg)   Height: 6' (1.829 m)   Body mass index is 30.08 kg/m.   Physical Exam   General Appearance:    Obese male in no acute distress  Eyes:    PERRL, conjunctiva/corneas clear, EOM's intact       Lungs:     Clear to auscultation bilaterally, respirations unlabored  Heart:    Normal heart rate. Normal rhythm. No murmurs,  rubs, or gallops.   MS:   All extremities are intact.   Neurologic:   Awake, alert, oriented x 3. No apparent focal neurological           defect.          Assessment & Plan    1. Type 2 diabetes mellitus without complication, without long-term current use of insulin (HCC) Doing well with current medications. He states he will be scheduling appointment for diabetic eye exam soon.  - HgB A1c  2. Hyperlipidemia, unspecified hyperlipidemia type He is tolerating pravastatin well with no adverse effects.   -  Comprehensive metabolic panel - Lipid panel (fasting)  3. Essential (primary) hypertension Doing well current medications. Not quite to goal. Work on improving diet and exercise. Continue current medications.  Recheck in 3-4 months. Consider adding spironolactone if not improving - EKG 12-Lead  4. Gastroesophageal reflux disease, unspecified whether esophagitis present Well controlled.  Continue omeprazole.   5. Benign prostatic hyperplasia with urinary obstruction Had been followed by Dr. Jacqlyn Larsen who has moved out of state. He is thinking about changing to local urologist. He'll be due for PSA in a few months. Advised we could check it here and refer to local urologist if elevated again.   The entirety of the information documented in the History of Present Illness, Review of Systems and Physical Exam were personally obtained by me. Portions of this information were initially documented by Idelle Jo, CMA and reviewed by me for thoroughness and accuracy.     Lelon Huh, MD  [facility]

## 2019-04-10 LAB — COMPREHENSIVE METABOLIC PANEL
ALT: 13 IU/L (ref 0–44)
AST: 14 IU/L (ref 0–40)
Albumin/Globulin Ratio: 2.1 (ref 1.2–2.2)
Albumin: 4.4 g/dL (ref 3.7–4.7)
Alkaline Phosphatase: 66 IU/L (ref 39–117)
BUN/Creatinine Ratio: 13 (ref 10–24)
BUN: 12 mg/dL (ref 8–27)
Bilirubin Total: 0.7 mg/dL (ref 0.0–1.2)
CO2: 24 mmol/L (ref 20–29)
Calcium: 9.6 mg/dL (ref 8.6–10.2)
Chloride: 101 mmol/L (ref 96–106)
Creatinine, Ser: 0.94 mg/dL (ref 0.76–1.27)
GFR calc Af Amer: 93 mL/min/{1.73_m2} (ref >59)
GFR calc non Af Amer: 81 mL/min/{1.73_m2} (ref >59)
Globulin, Total: 2.1 g/dL (ref 1.5–4.5)
Glucose: 141 mg/dL — ABNORMAL HIGH (ref 65–99)
Potassium: 3.9 mmol/L (ref 3.5–5.2)
Sodium: 142 mmol/L (ref 134–144)
Total Protein: 6.5 g/dL (ref 6.0–8.5)

## 2019-04-10 LAB — LIPID PANEL
Chol/HDL Ratio: 2.5 ratio (ref 0.0–5.0)
Cholesterol, Total: 146 mg/dL (ref 100–199)
HDL: 58 mg/dL (ref >39)
LDL Chol Calc (NIH): 70 mg/dL (ref 0–99)
Triglycerides: 98 mg/dL (ref 0–149)
VLDL Cholesterol Cal: 18 mg/dL (ref 5–40)

## 2019-04-10 LAB — HEMOGLOBIN A1C
Est. average glucose Bld gHb Est-mCnc: 134 mg/dL
Hgb A1c MFr Bld: 6.3 % — ABNORMAL HIGH (ref 4.8–5.6)

## 2019-05-27 ENCOUNTER — Other Ambulatory Visit: Payer: Self-pay | Admitting: Family Medicine

## 2019-05-27 DIAGNOSIS — E876 Hypokalemia: Secondary | ICD-10-CM

## 2019-05-28 DIAGNOSIS — H903 Sensorineural hearing loss, bilateral: Secondary | ICD-10-CM | POA: Diagnosis not present

## 2019-06-04 ENCOUNTER — Encounter: Payer: Self-pay | Admitting: Family Medicine

## 2019-06-11 ENCOUNTER — Other Ambulatory Visit: Payer: Self-pay | Admitting: Family Medicine

## 2019-06-11 ENCOUNTER — Encounter: Payer: Self-pay | Admitting: Family Medicine

## 2019-06-11 MED ORDER — TAMSULOSIN HCL 0.4 MG PO CAPS: 0.4000 mg | ORAL_CAPSULE | Freq: Every day | ORAL | 4 refills | Status: DC

## 2019-07-24 ENCOUNTER — Other Ambulatory Visit: Payer: Self-pay | Admitting: Family Medicine

## 2019-07-24 NOTE — Telephone Encounter (Signed)
Refill request for pravastatin 20 mg and atenolol 50 mg. Both prescriptions expired on 07/19/2019.

## 2019-08-13 ENCOUNTER — Other Ambulatory Visit: Payer: Self-pay | Admitting: Family Medicine

## 2019-08-13 NOTE — Telephone Encounter (Signed)
Requested Prescriptions  Pending Prescriptions Disp Refills  . ACCU-CHEK AVIVA PLUS test strip [Pharmacy Med Name: Salix 100S] 100 strip 3    Sig: USE ONCE EVERY DAY AS DIRECTED     Endocrinology: Diabetes - Testing Supplies Passed - 08/13/2019  2:41 PM      Passed - Valid encounter within last 12 months    Recent Outpatient Visits          4 months ago Type 2 diabetes mellitus without complication, without long-term current use of insulin ([place])   [facility] Birdie Sons, MD   10 months ago Type 2 diabetes mellitus without complication, without long-term current use of insulin ([place])   [facility] Birdie Sons, MD   1 year ago Type 2 diabetes mellitus without complication, without long-term current use of insulin ([place])   [facility] Birdie Sons, MD   1 year ago Type 2 diabetes mellitus without complication, without long-term current use of insulin ([place])   [facility] Birdie Sons, MD   1 year ago Type 2 diabetes mellitus without complication, without long-term current use of insulin [facility])   [place], Kirstie Peri, MD      Future Appointments            In 1 month Fisher, Kirstie Peri, MD [facility], [place]

## 2019-08-18 ENCOUNTER — Other Ambulatory Visit: Payer: Self-pay | Admitting: Family Medicine

## 2019-08-18 NOTE — Telephone Encounter (Signed)
Requested Prescriptions  Pending Prescriptions Disp Refills  . omeprazole (PRILOSEC) 20 MG capsule [Pharmacy Med Name: OMEPRAZOLE 20 MG Capsule Delayed Release] 90 capsule 4    Sig: TAKE 1 CAPSULE EVERY DAY     Gastroenterology: Proton Pump Inhibitors Passed - 08/18/2019  2:42 PM      Passed - Valid encounter within last 12 months    Recent Outpatient Visits          4 months ago Type 2 diabetes mellitus without complication, without long-term current use of insulin [facility])   [facility] Birdie Sons, MD   10 months ago Type 2 diabetes mellitus without complication, without long-term current use of insulin ([place])   [facility] Birdie Sons, MD   1 year ago Type 2 diabetes mellitus without complication, without long-term current use of insulin ([place])   [facility] Birdie Sons, MD   1 year ago Type 2 diabetes mellitus without complication, without long-term current use of insulin [facility])   [facility] Birdie Sons, MD   1 year ago Type 2 diabetes mellitus without complication, without long-term current use of insulin [facility])   [facility] Birdie Sons, MD      Future Appointments            In 1 month Fisher, Kirstie Peri, MD [facility], [place]

## 2019-08-19 LAB — HM DIABETES EYE EXAM

## 2019-08-20 DIAGNOSIS — H903 Sensorineural hearing loss, bilateral: Secondary | ICD-10-CM | POA: Diagnosis not present

## 2019-08-20 DIAGNOSIS — Z45321 Encounter for adjustment and management of cochlear device: Secondary | ICD-10-CM | POA: Diagnosis not present

## 2019-09-12 DIAGNOSIS — H903 Sensorineural hearing loss, bilateral: Secondary | ICD-10-CM | POA: Diagnosis not present

## 2019-10-03 ENCOUNTER — Telehealth: Payer: Self-pay | Admitting: Family Medicine

## 2019-10-03 NOTE — Chronic Care Management (AMB) (Signed)
  Chronic Care Management   Note  10/03/2019 Name: [name] MRN: [id number] DOB: 1946/03/04  Adrian Thompson is a 74 y.o. year old male who is a primary care patient of Caryn Section, Kirstie Peri, MD. I reached out to Adrian Thompson by phone today in response to a referral sent by Mr. Zeven Kocak Lukasiewicz's health plan.     Mr. Fonda was given information about Chronic Care Management services today including:  1. CCM service includes personalized support from designated clinical staff supervised by his physician, including individualized plan of care and coordination with other care providers 2. 24/7 contact phone numbers for assistance for urgent and routine care needs. 3. Service will only be billed when office clinical staff spend 20 minutes or more in a month to coordinate care. 4. Only one practitioner may furnish and bill the service in a calendar month. 5. The patient may stop CCM services at any time (effective at the end of the month) by phone call to the office staff. 6. The patient will be responsible for cost sharing (co-pay) of up to 20% of the service fee (after annual deductible is met).  Patient did not agree to enrollment in care management services and does not wish to consider at this time.  Follow up plan: The patient has been provided with contact information for the care management team and has been advised to call with any health related questions or concerns.   Noreene Larsson, [address], [place], [place] [address] Direct Dial: [phone] [email or web address]: [email or web address]

## 2019-10-07 NOTE — Progress Notes (Signed)
Established patient visit   Patient: Adrian Thompson   DOB: 06/10/1945   74 y.o. Male  MRN: [id number] Visit Date: 10/08/2019  Today's healthcare provider: Lelon Huh, MD   Chief Complaint  Patient presents with  . Diabetes  . Hypertension   [employer] as a scribe for Lelon Huh, MD.,have documented all relevant documentation on the behalf of Lelon Huh, MD,as directed by  Lelon Huh, MD while in the presence of Lelon Huh, MD.  Subjective    HPI Diabetes Mellitus Type II, Follow-up  Lab Results  Component Value Date   HGBA1C 6.3 (H) 04/09/2019   HGBA1C 6.9 (A) 10/10/2018   HGBA1C 6.4 (A) 01/08/2018   Wt Readings from Last 3 Encounters:  10/08/19 221 lb 6.4 oz (100.4 kg)  04/09/19 221 lb 12.8 oz (100.6 kg)  04/09/19 221 lb 12.8 oz (100.6 kg)   Last seen for diabetes 6 months ago.  Management since then includes continuing same medication. He reports good compliance with treatment. He is not having side effects.  Symptoms: No fatigue No foot ulcerations  No appetite changes No nausea  No paresthesia of the feet  No polydipsia  No polyuria No visual disturbances   No vomiting     Home blood sugar records: fasting range: low 100's-130's  Episodes of hypoglycemia? No    Current insulin regiment: None Most Recent Eye Exam: 08/19/2019 Current exercise: none Current diet habits: in general, a "healthy" diet    Pertinent Labs: Lab Results  Component Value Date   CHOL 146 04/09/2019   HDL 58 04/09/2019   LDLCALC 70 04/09/2019   TRIG 98 04/09/2019   CHOLHDL 2.5 04/09/2019   Lab Results  Component Value Date   NA 142 04/09/2019   K 3.9 04/09/2019   CREATININE 0.94 04/09/2019   GFRNONAA 81 04/09/2019   GFRAA 93 04/09/2019   GLUCOSE 141 (H) 04/09/2019     ---------------------------------------------------------------------------------------------------  Hypertension, follow-up  BP Readings from Last 3 Encounters:  10/08/19  (!) 151/74  04/09/19 (!) 142/80  04/09/19 (!) 164/76   Wt Readings from Last 3 Encounters:  10/08/19 221 lb 6.4 oz (100.4 kg)  04/09/19 221 lb 12.8 oz (100.6 kg)  04/09/19 221 lb 12.8 oz (100.6 kg)     He was last seen for hypertension 6 months ago.  BP at that visit was 142/80. Management since that visit includes counseling patient to continue same medications and work on improving diet and exercise.  He reports good compliance with treatment. He is not having side effects.  He is following a Regular diet. He is not exercising. He does not smoke.  Use of agents associated with hypertension: NSAIDS.   Outside blood pressures are being checked at home. Symptoms: No chest pain No chest pressure  No palpitations No syncope  No dyspnea No orthopnea  No paroxysmal nocturnal dyspnea No lower extremity edema   Pertinent labs: Lab Results  Component Value Date   CHOL 146 04/09/2019   HDL 58 04/09/2019   LDLCALC 70 04/09/2019   TRIG 98 04/09/2019   CHOLHDL 2.5 04/09/2019   Lab Results  Component Value Date   NA 142 04/09/2019   K 3.9 04/09/2019   CREATININE 0.94 04/09/2019   GFRNONAA 81 04/09/2019   GFRAA 93 04/09/2019   GLUCOSE 141 (H) 04/09/2019     The 10-year ASCVD risk score Mikey Bussing DC Jr., et al., 2013) is: 43.5%   ---------------------------------------------------------------------------------------------------  Having more allergy drainage lately, taking montelukast  and OTC nasocort.     Medications: Outpatient Medications Prior to Visit  Medication Sig  . ACCU-CHEK AVIVA PLUS test strip USE ONCE EVERY DAY AS DIRECTED  . amLODipine (NORVASC) 10 MG tablet TAKE 1 TABLET (10 MG TOTAL) BY MOUTH DAILY. (Patient taking differently: Take 10 mg by mouth daily. Unsure dose- paper copy has 5 mg)  . aspirin 81 MG tablet Take 1 tablet by mouth daily.  Marland Kitchen atenolol (TENORMIN) 50 MG tablet TAKE 1 TABLET EVERY DAY  . finasteride (PROSCAR) 5 MG tablet Take 5 mg daily by mouth.    Marland Kitchen glucosamine-chondroitin 500-400 MG tablet Take 1 tablet by mouth daily.  . indomethacin (INDOCIN) 25 MG capsule Take 1 capsule (25 mg total) by mouth 3 (three) times daily with meals as needed. Up to 3 times a day  . losartan-hydrochlorothiazide (HYZAAR) 100-25 MG tablet TAKE 1 TABLET EVERY DAY  . meloxicam (MOBIC) 15 MG tablet Take 1 tablet by mouth daily. As needed  . Misc Natural Products (BLACK CHERRY CONCENTRATE PO) Take 1 tablet by mouth daily.  . montelukast (SINGULAIR) 10 MG tablet TAKE 1 TABLET EVERY DAY  . Multiple Vitamins-Minerals (MULTIVITAMIN ADULT PO) Take 1 tablet by mouth daily.  . Omega-3 Fatty Acids (FISH OIL) 1000 MG CPDR Take 1 capsule by mouth daily.  Marland Kitchen omeprazole (PRILOSEC) 20 MG capsule TAKE 1 CAPSULE EVERY DAY  . potassium chloride SA (KLOR-CON) 20 MEQ tablet TAKE 1 TABLET EVERY DAY  . pravastatin (PRAVACHOL) 20 MG tablet TAKE 1 TABLET EVERY DAY  . tamsulosin (FLOMAX) 0.4 MG CAPS capsule Take 1 capsule (0.4 mg total) by mouth daily.  . Triamcinolone Acetonide (NASACORT AQ NA) Place into the nose daily.    No facility-administered medications prior to visit.    Review of Systems  Constitutional: Negative for appetite change, chills and fever.  Respiratory: Negative for chest tightness, shortness of breath and wheezing.   Cardiovascular: Negative for chest pain and palpitations.  Gastrointestinal: Negative for abdominal pain, nausea and vomiting.      Objective    BP 140/80   Pulse (!) 46   Temp (!) 97.1 F (36.2 C) (Temporal)   Wt 221 lb 6.4 oz (100.4 kg)   BMI 30.03 kg/m    Physical Exam   General: Appearance:    Obese male in no acute distress  ENT:    Mild nasal congestion and clear drainage      Lungs:     Clear to auscultation bilaterally, respirations unlabored  Heart:    Bradycardic. Normal rhythm. No murmurs, rubs, or gallops.   MS:   All extremities are intact.   Neurologic:   Awake, alert, oriented x 3. No apparent focal neurological            defect.       Results for orders placed or performed in visit on 10/08/19  POCT HgB A1C  Result Value Ref Range   Hemoglobin A1C 6.2 (A) 4.0 - 5.6 %   Est. average glucose Bld gHb Est-mCnc 131     Assessment & Plan     1. Type 2 diabetes mellitus without complication, without long-term current use of insulin (HCC) Very well controlled.   2. Essential (primary) hypertension Stable. Continue current medications.    3. Allergic rhinitis Continue Nasocort. Also start using Netipot. Consider adding azelastine nasal spray   Return in about 6 months (around 04/09/2020) for Annual Wellness Visit.      The entirety of the information documented in  the History of Present Illness, Review of Systems and Physical Exam were personally obtained by me. Portions of this information were initially documented by the CMA and reviewed by me for thoroughness and accuracy.      Lelon Huh, MD  [facility] [phone] (phone) [phone] (fax)  [place]

## 2019-10-08 ENCOUNTER — Encounter: Payer: Self-pay | Admitting: Family Medicine

## 2019-10-08 ENCOUNTER — Other Ambulatory Visit: Payer: Self-pay

## 2019-10-08 ENCOUNTER — Ambulatory Visit (INDEPENDENT_AMBULATORY_CARE_PROVIDER_SITE_OTHER): Payer: Medicare Other | Admitting: Family Medicine

## 2019-10-08 VITALS — BP 140/80 | HR 46 | Temp 97.1°F | Wt 221.4 lb

## 2019-10-08 DIAGNOSIS — R972 Elevated prostate specific antigen [PSA]: Secondary | ICD-10-CM | POA: Diagnosis not present

## 2019-10-08 DIAGNOSIS — E119 Type 2 diabetes mellitus without complications: Secondary | ICD-10-CM

## 2019-10-08 DIAGNOSIS — I10 Essential (primary) hypertension: Secondary | ICD-10-CM

## 2019-10-08 DIAGNOSIS — J301 Allergic rhinitis due to pollen: Secondary | ICD-10-CM | POA: Diagnosis not present

## 2019-10-08 LAB — POCT GLYCOSYLATED HEMOGLOBIN (HGB A1C)
Est. average glucose Bld gHb Est-mCnc: 131
Hemoglobin A1C: 6.2 % — AB (ref 4.0–5.6)

## 2019-10-08 NOTE — Patient Instructions (Addendum)
.   Please review the attached list of medications and notify my office if there are any errors.    Start using OTC Netipot every day to help with allergies. If not helping then call for prescription for Azelastine nasal spray

## 2019-10-09 LAB — PSA TOTAL (REFLEX TO FREE): Prostate Specific Ag, Serum: 1.6 ng/mL (ref 0.0–4.0)

## 2019-11-28 ENCOUNTER — Encounter: Payer: Self-pay | Admitting: Family Medicine

## 2019-11-28 ENCOUNTER — Other Ambulatory Visit: Payer: Self-pay | Admitting: Family Medicine

## 2019-11-28 DIAGNOSIS — I10 Essential (primary) hypertension: Secondary | ICD-10-CM

## 2019-11-28 DIAGNOSIS — E876 Hypokalemia: Secondary | ICD-10-CM

## 2019-11-28 DIAGNOSIS — N138 Other obstructive and reflux uropathy: Secondary | ICD-10-CM

## 2019-12-02 MED ORDER — FINASTERIDE 5 MG PO TABS: 5.0000 mg | ORAL_TABLET | Freq: Every day | ORAL | 4 refills | Status: DC

## 2019-12-12 ENCOUNTER — Other Ambulatory Visit: Payer: Self-pay | Admitting: Family Medicine

## 2019-12-12 NOTE — Telephone Encounter (Signed)
Requested  medications are  due for refill today yes  Requested medications are on the active medication list yes  Last refill 5/14  Last visit 10/08/19  Future visit scheduled Yes 04/13/2020  Notes to clinic Failed protocol of pertinent labs within 6 months

## 2020-01-08 DIAGNOSIS — D2261 Melanocytic nevi of right upper limb, including shoulder: Secondary | ICD-10-CM | POA: Diagnosis not present

## 2020-01-08 DIAGNOSIS — L821 Other seborrheic keratosis: Secondary | ICD-10-CM | POA: Diagnosis not present

## 2020-01-08 DIAGNOSIS — X32XXXA Exposure to sunlight, initial encounter: Secondary | ICD-10-CM | POA: Diagnosis not present

## 2020-01-08 DIAGNOSIS — D2262 Melanocytic nevi of left upper limb, including shoulder: Secondary | ICD-10-CM | POA: Diagnosis not present

## 2020-01-08 DIAGNOSIS — D225 Melanocytic nevi of trunk: Secondary | ICD-10-CM | POA: Diagnosis not present

## 2020-01-08 DIAGNOSIS — D2271 Melanocytic nevi of right lower limb, including hip: Secondary | ICD-10-CM | POA: Diagnosis not present

## 2020-01-08 DIAGNOSIS — D2272 Melanocytic nevi of left lower limb, including hip: Secondary | ICD-10-CM | POA: Diagnosis not present

## 2020-01-08 DIAGNOSIS — H903 Sensorineural hearing loss, bilateral: Secondary | ICD-10-CM | POA: Diagnosis not present

## 2020-01-08 DIAGNOSIS — L570 Actinic keratosis: Secondary | ICD-10-CM | POA: Diagnosis not present

## 2020-01-22 DIAGNOSIS — H903 Sensorineural hearing loss, bilateral: Secondary | ICD-10-CM | POA: Diagnosis not present

## 2020-02-15 DIAGNOSIS — Z23 Encounter for immunization: Secondary | ICD-10-CM | POA: Diagnosis not present

## 2020-03-09 DIAGNOSIS — H903 Sensorineural hearing loss, bilateral: Secondary | ICD-10-CM | POA: Diagnosis not present

## 2020-04-03 ENCOUNTER — Telehealth: Payer: Self-pay

## 2020-04-03 NOTE — Telephone Encounter (Signed)
Copied from [place] [id number]. Topic: Appointment Scheduling - Scheduling Inquiry for Clinic >> Apr 03, 2020  8:09 AM Scherrie Gerlach wrote: Reason for CRM: wife calling to let [facility] know pt cannot make that new appt ans needs you to call to resc to another time

## 2020-04-03 NOTE — Telephone Encounter (Signed)
Please review. Thanks!  

## 2020-04-06 NOTE — Telephone Encounter (Signed)
I will CB today when I get a break in between pts.

## 2020-04-06 NOTE — Telephone Encounter (Signed)
Pts wife called again today to reschedule Pts appt with [facility] / please advise

## 2020-04-06 NOTE — Telephone Encounter (Signed)
R/S call to 1:20 PM on 04/13/20.

## 2020-04-09 NOTE — Progress Notes (Deleted)
Subjective:   Adrian Thompson is a 74 y.o. male who presents for Medicare Annual/Subsequent preventive examination.  Review of Systems    N/A        Objective:    There were no vitals filed for this visit. There is no height or weight on file to calculate BMI.  Advanced Directives 04/09/2019 04/04/2018 04/03/2018 04/03/2017  Does Patient Have a Medical Advance Directive? Yes Yes Yes Yes  Type of Paramedic of [place];Living will Adrian Thompson;[name];[name];Living will  Copy of [place] in Chart? Yes - validated most recent copy scanned in chart (See row information) No - copy requested Yes - validated most recent copy scanned in chart (See row information) No - copy requested    Current Medications (verified) Outpatient Encounter Medications as of 04/13/2020  Medication Sig  . ACCU-CHEK AVIVA PLUS test strip USE ONCE EVERY DAY AS DIRECTED  . amLODipine (NORVASC) 10 MG tablet TAKE 1 TABLET EVERY DAY  . aspirin 81 MG tablet Take 1 tablet by mouth daily.  Marland Kitchen atenolol (TENORMIN) 50 MG tablet TAKE 1 TABLET EVERY DAY  . finasteride (PROSCAR) 5 MG tablet Take 1 tablet (5 mg total) by mouth daily.  Marland Kitchen glucosamine-chondroitin 500-400 MG tablet Take 1 tablet by mouth daily.  . indomethacin (INDOCIN) 25 MG capsule Take 1 capsule (25 mg total) by mouth 3 (three) times daily with meals as needed. Up to 3 times a day  . losartan-hydrochlorothiazide (HYZAAR) 100-25 MG tablet TAKE 1 TABLET EVERY DAY  . meloxicam (MOBIC) 15 MG tablet Take 1 tablet by mouth daily. As needed  . Misc Natural Products (BLACK CHERRY CONCENTRATE PO) Take 1 tablet by mouth daily.  . montelukast (SINGULAIR) 10 MG tablet TAKE 1 TABLET EVERY DAY  . Multiple Vitamins-Minerals (MULTIVITAMIN ADULT PO) Take 1 tablet by mouth daily.  . Omega-3 Fatty Acids (FISH OIL) 1000 MG CPDR Take 1 capsule by mouth  daily.  Marland Kitchen omeprazole (PRILOSEC) 20 MG capsule TAKE 1 CAPSULE EVERY DAY  . potassium chloride SA (KLOR-CON) 20 MEQ tablet TAKE 1 TABLET EVERY DAY  . pravastatin (PRAVACHOL) 20 MG tablet TAKE 1 TABLET EVERY DAY  . tamsulosin (FLOMAX) 0.4 MG CAPS capsule Take 1 capsule (0.4 mg total) by mouth daily.  . Triamcinolone Acetonide (NASACORT AQ NA) Place into the nose daily.    No facility-administered encounter medications on file as of 04/13/2020.    Allergies (verified) Patient has no known allergies.   History: Past Medical History:  Diagnosis Date  . Diabetes mellitus without complication ([place])    type 2  . Gout   . History of chicken pox   . History of measles   . History of mumps   . Hyperlipidemia   . Hypertension    Past Surgical History:  Procedure Laterality Date  . COCHLEAR IMPLANT Right 06/28/2016   Vira Blanco, MD [facility]  . COLONOSCOPY  2009  . COLONOSCOPY WITH PROPOFOL N/A 04/04/2018   Procedure: COLONOSCOPY WITH PROPOFOL;  Surgeon: Robert Bellow, MD;  Location: [facility] ENDOSCOPY;  Service: Endoscopy;  Laterality: N/A;  . EXTERNAL EAR SURGERY  2018  . TONSILLECTOMY    . UPPER GI ENDOSCOPY  2009  . UPPER GI ENDOSCOPY  03/04/2011   [facility]; Excision of multiple gastric polyps   Family History  Problem Relation Age of Onset  . Hypertension Mother   . Hypertension Sister    Social  History   Socioeconomic History  . Marital status: Married    Spouse name: Not on file  . Number of children: 0  . Years of education: Not on file  . Highest education level: Associate degree: occupational, Hotel manager, or vocational program  Occupational History  . Occupation: Retired  Tobacco Use  . Smoking status: Never Smoker  . Smokeless tobacco: Former Systems developer    Types: Snuff, Chew  Vaping Use  . Vaping Use: Never used  Substance and Sexual Activity  . Alcohol use: No    Alcohol/week: 0.0 standard drinks  . Drug use: No  . Sexual activity: Not on file  Other Topics Concern   . Not on file  Social History Narrative  . Not on file   Social Determinants of Health   Financial Resource Strain:   . Difficulty of Paying Living Expenses: Not on file  Food Insecurity:   . Worried About Charity fundraiser in the Last Year: Not on file  . Ran Out of Food in the Last Year: Not on file  Transportation Needs:   . Lack of Transportation (Medical): Not on file  . Lack of Transportation (Non-Medical): Not on file  Physical Activity:   . Days of Exercise per Week: Not on file  . Minutes of Exercise per Session: Not on file  Stress:   . Feeling of Stress : Not on file  Social Connections:   . Frequency of Communication with Friends and Family: Not on file  . Frequency of Social Gatherings with Friends and Family: Not on file  . Attends Religious Services: Not on file  . Active Member of Clubs or Organizations: Not on file  . Attends Archivist Meetings: Not on file  . Marital Status: Not on file    Tobacco Counseling Counseling given: Not Answered   Clinical Intake:                 Diabetic? Yes  Nutrition Risk Assessment:  Has the patient had any N/V/D within the last 2 months?  No  Does the patient have any non-healing wounds?  No  Has the patient had any unintentional weight loss or weight gain?  No   Diabetes:  Is the patient diabetic?  Yes  If diabetic, was a CBG obtained today?  No  Did the patient bring in their glucometer from home?  No  How often do you monitor your CBG's? ***.   Financial Strains and Diabetes Management:  Are you having any financial strains with the device, your supplies or your medication? No .  Does the patient want to be seen by Chronic Care Management for management of their diabetes?  No  Would the patient like to be referred to a Nutritionist or for Diabetic Management?  No   Diabetic Exams:  Diabetic Eye Exam: Completed 08/19/19 Diabetic Foot Exam: Overdue, Pt has been advised about the  importance in completing this exam. Pt is scheduled for diabetic foot exam on ***.          Activities of Daily Living No flowsheet data found.  Patient Care Team: Birdie Sons, MD as PCP - General (Family Medicine) Bary Castilla, Forest Gleason, MD (General Surgery) Lamonte Richer, [place] (Urology) Oneta Rack, MD (Dermatology) Throat, [place] [facility]  Indicate any recent Medical Services you may have received from other than [facility] providers in the past year (date may be approximate).     Assessment:   This is a  routine wellness examination for [place].  Hearing/Vision screen No exam data present  Dietary issues and exercise activities discussed:    Goals    . Exercise 3x per week (30 min per time)     Recommend to start exercising three times a week for 30 minutes.       Depression Screen PHQ 2/9 Scores 04/09/2019 04/03/2018 04/03/2017 04/01/2016 03/02/2015  PHQ - 2 Score 0 0 0 0 0  PHQ- 9 Score - - - - 1    Fall Risk Fall Risk  04/09/2019 04/10/2018 04/03/2018 03/27/2018 04/03/2017  Falls in the past year? 0 0 0 0 No  Comment - Emmi Telephone Survey: data to providers prior to load - - -  Number falls in past yr: 0 - - - -  Injury with Fall? 0 - - - -    Any stairs in or around the home? {YES/NO:21197} If so, are there any without handrails? {YES/NO:21197} Home free of loose throw rugs in walkways, pet beds, electrical cords, etc? Yes  Adequate lighting in your home to reduce risk of falls? Yes   ASSISTIVE DEVICES UTILIZED TO PREVENT FALLS:  Life alert? {YES/NO:21197} Use of a cane, walker or w/c? {YES/NO:21197} Grab bars in the bathroom? {YES/NO:21197} Shower chair or bench in shower? {YES/NO:21197} Elevated toilet seat or a handicapped toilet? {YES/NO:21197}   Cognitive Function:        Immunizations Immunization History  Administered Date(s) Administered  . Influenza, High Dose Seasonal PF 03/02/2015, 03/07/2016, 02/23/2018  .  Influenza-Unspecified 03/09/2017, 02/13/2019, 02/15/2020  . Pneumococcal Conjugate-13 02/26/2014  . Pneumococcal Polysaccharide-23 02/25/2013  . Td 03/20/2001  . Tdap 02/14/2011  . Zoster 06/19/2007  . Zoster Recombinat (Shingrix) 08/31/2017, 11/08/2017    TDAP status: Up to date Flu Vaccine status: Up to date Pneumococcal vaccine status: Up to date {Covid-19 vaccine status:2101808}  Qualifies for Shingles Vaccine? Yes   Zostavax completed Yes   Shingrix Completed?: Yes  Screening Tests Health Maintenance  Topic Date Due  . COVID-19 Vaccine (1) Never done  . FOOT EXAM  04/03/2018  . HEMOGLOBIN A1C  04/09/2020  . OPHTHALMOLOGY EXAM  08/18/2020  . TETANUS/TDAP  02/13/2021  . COLONOSCOPY  04/05/2023  . INFLUENZA VACCINE  Completed  . PNA vac Low Risk Adult  Completed  . Hepatitis C Screening  Addressed    Health Maintenance  Health Maintenance Due  Topic Date Due  . COVID-19 Vaccine (1) Never done  . FOOT EXAM  04/03/2018  . HEMOGLOBIN A1C  04/09/2020    Colorectal cancer screening: Completed 04/04/18. Repeat every 5 years  Lung Cancer Screening: (Low Dose CT Chest recommended if Age 59-80 years, 30 pack-year currently smoking OR have quit w/in 15years.) {DOES NOT does:27190::"does not"} qualify.   Lung Cancer Screening Referral: ***  Additional Screening:  Hepatitis C Screening: Up to date  Vision Screening: Recommended annual ophthalmology exams for early detection of glaucoma and other disorders of the eye. Is the patient up to date with their annual eye exam?  Yes  Who is the provider or what is the name of the office in which the patient attends annual eye exams? *** If pt is not established with a provider, would they like to be referred to a provider to establish care? No .   Dental Screening: Recommended annual dental exams for proper oral hygiene  Community Resource Referral / Chronic Care Management: CRR required this visit?  No   CCM required this  visit?  No  Plan:     I have personally reviewed and noted the following in the patient's chart:   . Medical and social history . Use of alcohol, tobacco or illicit drugs  . Current medications and supplements . Functional ability and status . Nutritional status . Physical activity . Advanced directives . List of other physicians . Hospitalizations, surgeries, and ER visits in previous 12 months . Vitals . Screenings to include cognitive, depression, and falls . Referrals and appointments  In addition, I have reviewed and discussed with patient certain preventive protocols, quality metrics, and best practice recommendations. A written personalized care plan for preventive services as well as general preventive health recommendations were provided to patient.     Tynell Winchell [place], [place]   [id number]   Nurse Notes: ***

## 2020-04-13 ENCOUNTER — Encounter: Payer: Self-pay | Admitting: Family Medicine

## 2020-04-13 ENCOUNTER — Other Ambulatory Visit: Payer: Self-pay

## 2020-04-13 ENCOUNTER — Ambulatory Visit (INDEPENDENT_AMBULATORY_CARE_PROVIDER_SITE_OTHER): Payer: Medicare Other | Admitting: Family Medicine

## 2020-04-13 ENCOUNTER — Encounter: Payer: Medicare Other | Admitting: Family Medicine

## 2020-04-13 ENCOUNTER — Telehealth: Payer: Self-pay | Admitting: Family Medicine

## 2020-04-13 VITALS — BP 155/86 | HR 54 | Temp 98.4°F | Resp 16 | Ht 72.0 in | Wt 219.0 lb

## 2020-04-13 DIAGNOSIS — J301 Allergic rhinitis due to pollen: Secondary | ICD-10-CM

## 2020-04-13 DIAGNOSIS — G8929 Other chronic pain: Secondary | ICD-10-CM | POA: Diagnosis not present

## 2020-04-13 DIAGNOSIS — N411 Chronic prostatitis: Secondary | ICD-10-CM

## 2020-04-13 DIAGNOSIS — E119 Type 2 diabetes mellitus without complications: Secondary | ICD-10-CM | POA: Diagnosis not present

## 2020-04-13 DIAGNOSIS — I10 Essential (primary) hypertension: Secondary | ICD-10-CM

## 2020-04-13 DIAGNOSIS — N401 Enlarged prostate with lower urinary tract symptoms: Secondary | ICD-10-CM | POA: Diagnosis not present

## 2020-04-13 DIAGNOSIS — M25512 Pain in left shoulder: Secondary | ICD-10-CM

## 2020-04-13 DIAGNOSIS — N138 Other obstructive and reflux uropathy: Secondary | ICD-10-CM | POA: Diagnosis not present

## 2020-04-13 MED ORDER — MELOXICAM 15 MG PO TABS: 15.0000 mg | ORAL_TABLET | Freq: Every day | ORAL | 2 refills | Status: DC | PRN

## 2020-04-13 MED ORDER — AZELASTINE HCL 0.1 % NA SOLN: 2.0000 | Freq: Two times a day (BID) | NASAL | 3 refills | Status: DC

## 2020-04-13 NOTE — Telephone Encounter (Signed)
Copied from [place] [id number]. Topic: Medicare AWV >> Apr 13, 2020  9:16 AM Cher Nakai R wrote: Reason for CRM:   Left message to cancel AWVS today Nov 22,2021 due to [facility] out of the office.   New appointment scheduled Dec 6,2021 at 1:20 by phone -srs

## 2020-04-13 NOTE — Progress Notes (Signed)
Established patient visit   Patient: Adrian Thompson   DOB: November 19, 1945   74 y.o. Male  MRN: [id number] Visit Date: 04/13/2020  Today's healthcare provider: Lelon Huh, MD   Chief Complaint  Patient presents with  . Diabetes  . Hyperlipidemia  . Hypertension  . f/u to AWE   Subjective    HPI  Patient has AWE visit later today.   Hypertension, follow-up  BP Readings from Last 3 Encounters:  04/13/20 (!) 155/86  10/08/19 140/80  04/09/19 (!) 142/80   Wt Readings from Last 3 Encounters:  04/13/20 219 lb (99.3 kg)  10/08/19 221 lb 6.4 oz (100.4 kg)  04/09/19 221 lb 12.8 oz (100.6 kg)     He was last seen for hypertension 6 months ago.  BP at that visit was 140/80. Management since that visit includes no changes.  He reports good compliance with treatment. He is not having side effects.   He is following a Regular diet. He is not exercising. He does not smoke.  Use of agents associated with hypertension: none.   Outside blood pressures are not being checked. Symptoms: No chest pain No chest pressure  No palpitations No syncope  No dyspnea No orthopnea  No paroxysmal nocturnal dyspnea No lower extremity edema   Pertinent labs: Lab Results  Component Value Date   CHOL 146 04/09/2019   HDL 58 04/09/2019   LDLCALC 70 04/09/2019   TRIG 98 04/09/2019   CHOLHDL 2.5 04/09/2019   Lab Results  Component Value Date   NA 142 04/09/2019   K 3.9 04/09/2019   CREATININE 0.94 04/09/2019   GFRNONAA 81 04/09/2019   GFRAA 93 04/09/2019   GLUCOSE 141 (H) 04/09/2019     The 10-year ASCVD risk score Mikey Bussing DC Jr., et al., 2013) is: 47.9%   Diabetes Mellitus Type II, Follow-up  Lab Results  Component Value Date   HGBA1C 6.2 (A) 10/08/2019   HGBA1C 6.3 (H) 04/09/2019   HGBA1C 6.9 (A) 10/10/2018   Wt Readings from Last 3 Encounters:  04/13/20 219 lb (99.3 kg)  10/08/19 221 lb 6.4 oz (100.4 kg)  04/09/19 221 lb 12.8 oz (100.6 kg)   Last seen for diabetes 6  months ago.  Management since then includes no changes. He reports good compliance with treatment. He is not having side effects.  Symptoms: No fatigue No foot ulcerations  No appetite changes No nausea  No paresthesia of the feet  No polydipsia  No polyuria No visual disturbances   No vomiting     Home blood sugar records: fasting range: 120s  Episodes of hypoglycemia? No    Current insulin regiment: none Most Recent Eye Exam: 05/2019 Current exercise: walking Current diet habits: well balanced  Lipid/Cholesterol, Follow-up  Last lipid panel Other pertinent labs  Lab Results  Component Value Date   CHOL 146 04/09/2019   HDL 58 04/09/2019   LDLCALC 70 04/09/2019   TRIG 98 04/09/2019   CHOLHDL 2.5 04/09/2019   Lab Results  Component Value Date   ALT 13 04/09/2019   AST 14 04/09/2019   PLT 211 11/02/2016   TSH 1.190 04/03/2018     He was last seen for this 6 months ago.  Management since that visit includes no changes.  He reports good compliance with treatment. He is not having side effects.       Medications: Outpatient Medications Prior to Visit  Medication Sig  . ACCU-CHEK AVIVA PLUS test strip USE ONCE EVERY  DAY AS DIRECTED  . amLODipine (NORVASC) 10 MG tablet TAKE 1 TABLET EVERY DAY  . aspirin 81 MG tablet Take 1 tablet by mouth daily.  Marland Kitchen atenolol (TENORMIN) 50 MG tablet TAKE 1 TABLET EVERY DAY  . finasteride (PROSCAR) 5 MG tablet Take 1 tablet (5 mg total) by mouth daily.  Marland Kitchen glucosamine-chondroitin 500-400 MG tablet Take 1 tablet by mouth daily.  . indomethacin (INDOCIN) 25 MG capsule Take 1 capsule (25 mg total) by mouth 3 (three) times daily with meals as needed. Up to 3 times a day  . losartan-hydrochlorothiazide (HYZAAR) 100-25 MG tablet TAKE 1 TABLET EVERY DAY  . meloxicam (MOBIC) 15 MG tablet Take 1 tablet by mouth daily. As needed  . Misc Natural Products (BLACK CHERRY CONCENTRATE PO) Take 1 tablet by mouth daily.  . montelukast (SINGULAIR) 10  MG tablet TAKE 1 TABLET EVERY DAY  . Multiple Vitamins-Minerals (MULTIVITAMIN ADULT PO) Take 1 tablet by mouth daily.  . Omega-3 Fatty Acids (FISH OIL) 1000 MG CPDR Take 1 capsule by mouth daily.  Marland Kitchen omeprazole (PRILOSEC) 20 MG capsule TAKE 1 CAPSULE EVERY DAY  . potassium chloride SA (KLOR-CON) 20 MEQ tablet TAKE 1 TABLET EVERY DAY  . pravastatin (PRAVACHOL) 20 MG tablet TAKE 1 TABLET EVERY DAY  . tamsulosin (FLOMAX) 0.4 MG CAPS capsule Take 1 capsule (0.4 mg total) by mouth daily.  . Triamcinolone Acetonide (NASACORT AQ NA) Place into the nose daily.    No facility-administered medications prior to visit.    Review of Systems  Constitutional: Negative.   HENT: Negative.   Eyes: Negative.   Respiratory: Negative.   Cardiovascular: Negative.   Gastrointestinal: Negative.   Endocrine: Negative.   Genitourinary: Negative.   Musculoskeletal: Positive for arthralgias and back pain.  Skin: Negative.   Allergic/Immunologic: Negative.   Neurological: Negative.   Hematological: Bruises/bleeds easily.  Psychiatric/Behavioral: Negative.       Objective    BP (!) 155/86   Pulse (!) 54   Temp 98.4 F (36.9 C)   Resp 16   Ht 6' (1.829 m)   Wt 219 lb (99.3 kg)   BMI 29.70 kg/m    Physical Exam   General: Appearance:     Overweight male in no acute distress  Eyes:    PERRL, conjunctiva/corneas clear, EOM's intact       Lungs:     Clear to auscultation bilaterally, respirations unlabored  Heart:    Bradycardic. Normal rhythm. No murmurs, rubs, or gallops.   MS:   All extremities are intact.   Neurologic:   Awake, alert, oriented x 3. No apparent focal neurological           defect.         No results found for any visits on 04/13/20.  Assessment & Plan     1. Essential (primary) hypertension SBP not at goal. Is tolerant and compliant with medications. Has not been adhering to healthy diet very well. He is going to work on improving diet, exercising and losing weight.  Recheck bp in about 4 months. Advised may need to add another medication if not improved at follow up.  - CBC - Comprehensive metabolic panel - Lipid panel - EKG 12-Lead  2. Type 2 diabetes mellitus without complication, without long-term current use of insulin (HCC) Well controlled.  Continue current medications.   - Hemoglobin A1c - EKG 12-Lead  3. Benign prostatic hyperplasia with urinary obstruction No longer followed by Dr. Jacqlyn Larsen. PSA has been elevated,  will check today.   4. Allergic rhinitis due to pollen, unspecified seasonality He states continues to be congested and having occasional nose bleeds, using Nasacort intermittently and interested in trying something different, will change to - azelastine (ASTELIN) 0.1 % nasal spray; Place 2 sprays into both nostrils 2 (two) times daily. Use in each nostril as directed  Dispense: 90 mL; Refill: 3  5. Chronic left shoulder pain refill- meloxicam (MOBIC) 15 MG tablet; Take 1 tablet (15 mg total) by mouth daily as needed for pain. As needed  Dispense: 90 tablet; Refill: 2  6. Chronic prostatitis  - PSA    No follow-ups on file.      The entirety of the information documented in the History of Present Illness, Review of Systems and Physical Exam were personally obtained by me. Portions of this information were initially documented by the CMA and reviewed by me for thoroughness and accuracy.      Lelon Huh, MD  [facility] [phone] (phone) [phone] (fax)  [place]

## 2020-04-14 LAB — COMPREHENSIVE METABOLIC PANEL WITH GFR
ALT: 12 IU/L (ref 0–44)
AST: 14 IU/L (ref 0–40)
Albumin/Globulin Ratio: 1.8 (ref 1.2–2.2)
Albumin: 4.4 g/dL (ref 3.7–4.7)
Alkaline Phosphatase: 64 IU/L (ref 44–121)
BUN/Creatinine Ratio: 13 (ref 10–24)
BUN: 13 mg/dL (ref 8–27)
Bilirubin Total: 0.7 mg/dL (ref 0.0–1.2)
CO2: 26 mmol/L (ref 20–29)
Calcium: 9.6 mg/dL (ref 8.6–10.2)
Chloride: 102 mmol/L (ref 96–106)
Creatinine, Ser: 0.98 mg/dL (ref 0.76–1.27)
GFR calc Af Amer: 88 mL/min/1.73
GFR calc non Af Amer: 76 mL/min/1.73
Globulin, Total: 2.5 g/dL (ref 1.5–4.5)
Glucose: 136 mg/dL — ABNORMAL HIGH (ref 65–99)
Potassium: 3.9 mmol/L (ref 3.5–5.2)
Sodium: 143 mmol/L (ref 134–144)
Total Protein: 6.9 g/dL (ref 6.0–8.5)

## 2020-04-14 LAB — PSA: Prostate Specific Ag, Serum: 1.8 ng/mL (ref 0.0–4.0)

## 2020-04-14 LAB — CBC
Hematocrit: 47.1 % (ref 37.5–51.0)
Hemoglobin: 16 g/dL (ref 13.0–17.7)
MCH: 30 pg (ref 26.6–33.0)
MCHC: 34 g/dL (ref 31.5–35.7)
MCV: 88 fL (ref 79–97)
Platelets: 230 x10E3/uL (ref 150–450)
RBC: 5.34 x10E6/uL (ref 4.14–5.80)
RDW: 12.5 % (ref 11.6–15.4)
WBC: 5.9 x10E3/uL (ref 3.4–10.8)

## 2020-04-14 LAB — LIPID PANEL
Chol/HDL Ratio: 2.4 ratio (ref 0.0–5.0)
Cholesterol, Total: 156 mg/dL (ref 100–199)
HDL: 66 mg/dL (ref >39)
LDL Chol Calc (NIH): 71 mg/dL (ref 0–99)
Triglycerides: 109 mg/dL (ref 0–149)
VLDL Cholesterol Cal: 19 mg/dL (ref 5–40)

## 2020-04-14 LAB — HEMOGLOBIN A1C
Est. average glucose Bld gHb Est-mCnc: 134 mg/dL
Hgb A1c MFr Bld: 6.3 % — ABNORMAL HIGH (ref 4.8–5.6)

## 2020-04-23 NOTE — Progress Notes (Signed)
Subjective:   Adrian Thompson is a 74 y.o. male who presents for Medicare Annual/Subsequent preventive examination.  I connected with Laurence Crofford today by telephone and verified that I am speaking with the correct person using two identifiers. Location patient: home Location provider: work Persons participating in the virtual visit: patient, provider.   I discussed the limitations, risks, security and privacy concerns of performing an evaluation and management service by telephone and the availability of in person appointments. I also discussed with the patient that there may be a patient responsible charge related to this service. The patient expressed understanding and verbally consented to this telephonic visit.    Interactive audio and video telecommunications were attempted between this provider and patient, however failed, due to patient having technical difficulties OR patient did not have access to video capability.  We continued and completed visit with audio only.   Review of Systems    N/A  Cardiac Risk Factors include: advanced age (>18men, >73 women);dyslipidemia;hypertension;male gender     Objective:    There were no vitals filed for this visit. There is no height or weight on file to calculate BMI.  Advanced Directives 04/27/2020 04/09/2019 04/04/2018 04/03/2018 04/03/2017  Does Patient Have a Medical Advance Directive? Yes Yes Yes Yes Yes  Type of Paramedic of [place];Living will [place];Living will [place];Living will [place];Living will [place];Living will  Copy of [place] in Chart? Yes - validated most recent copy scanned in chart (See row information) Yes - validated most recent copy scanned in chart (See row information) No - copy requested Yes - validated most recent copy scanned in chart (See row information) No - copy requested     Current Medications (verified) Outpatient Encounter Medications as of 04/27/2020  Medication Sig  . ACCU-CHEK AVIVA PLUS test strip USE ONCE EVERY DAY AS DIRECTED  . amLODipine (NORVASC) 10 MG tablet TAKE 1 TABLET EVERY DAY  . aspirin 81 MG tablet Take 1 tablet by mouth daily.  Marland Kitchen atenolol (TENORMIN) 50 MG tablet TAKE 1 TABLET EVERY DAY  . azelastine (ASTELIN) 0.1 % nasal spray Place 2 sprays into both nostrils 2 (two) times daily. Use in each nostril as directed  . finasteride (PROSCAR) 5 MG tablet Take 1 tablet (5 mg total) by mouth daily.  Marland Kitchen glucosamine-chondroitin 500-400 MG tablet Take 1 tablet by mouth daily.  . indomethacin (INDOCIN) 25 MG capsule Take 1 capsule (25 mg total) by mouth 3 (three) times daily with meals as needed. Up to 3 times a day  . losartan-hydrochlorothiazide (HYZAAR) 100-25 MG tablet TAKE 1 TABLET EVERY DAY  . meloxicam (MOBIC) 15 MG tablet Take 1 tablet (15 mg total) by mouth daily as needed for pain. As needed  . Misc Natural Products (BLACK CHERRY CONCENTRATE PO) Take 1 tablet by mouth 2 (two) times daily.   . Multiple Vitamins-Minerals (MULTIVITAMIN ADULT PO) Take 1 tablet by mouth daily.  . Omega-3 Fatty Acids (FISH OIL) 1000 MG CPDR Take 1 capsule by mouth daily.  Marland Kitchen omeprazole (PRILOSEC) 20 MG capsule TAKE 1 CAPSULE EVERY DAY  . potassium chloride SA (KLOR-CON) 20 MEQ tablet TAKE 1 TABLET EVERY DAY  . pravastatin (PRAVACHOL) 20 MG tablet TAKE 1 TABLET EVERY DAY  . tamsulosin (FLOMAX) 0.4 MG CAPS capsule Take 1 capsule (0.4 mg total) by mouth daily.  . montelukast (SINGULAIR) 10 MG tablet TAKE 1 TABLET EVERY DAY (Patient not  taking: Reported on 04/27/2020)  . Triamcinolone Acetonide (NASACORT AQ NA) Place into the nose daily.  (Patient not taking: Reported on 04/27/2020)   No facility-administered encounter medications on file as of 04/27/2020.    Allergies (verified) Patient has no known allergies.   History: Past Medical History:  Diagnosis Date  .  Diabetes mellitus without complication ([place])    type 2  . Gout   . History of chicken pox   . History of measles   . History of mumps   . Hyperlipidemia   . Hypertension    Past Surgical History:  Procedure Laterality Date  . COCHLEAR IMPLANT Right 06/28/2016   Vira Blanco, MD [facility]  . COLONOSCOPY  2009  . COLONOSCOPY WITH PROPOFOL N/A 04/04/2018   Procedure: COLONOSCOPY WITH PROPOFOL;  Surgeon: Robert Bellow, MD;  Location: [facility] ENDOSCOPY;  Service: Endoscopy;  Laterality: N/A;  . EXTERNAL EAR SURGERY  2018  . TONSILLECTOMY    . UPPER GI ENDOSCOPY  2009  . UPPER GI ENDOSCOPY  03/04/2011   [facility]; Excision of multiple gastric polyps   Family History  Problem Relation Age of Onset  . Hypertension Mother   . Hypertension Sister    Social History   Socioeconomic History  . Marital status: Married    Spouse name: Not on file  . Number of children: 0  . Years of education: Not on file  . Highest education level: Associate degree: occupational, Hotel manager, or vocational program  Occupational History  . Occupation: Retired  Tobacco Use  . Smoking status: Never Smoker  . Smokeless tobacco: Former Systems developer    Types: Snuff, Chew  Vaping Use  . Vaping Use: Never used  Substance and Sexual Activity  . Alcohol use: No    Alcohol/week: 0.0 standard drinks  . Drug use: No  . Sexual activity: Not on file  Other Topics Concern  . Not on file  Social History Narrative  . Not on file   Social Determinants of Health   Financial Resource Strain: Low Risk   . Difficulty of Paying Living Expenses: Not hard at all  Food Insecurity: No Food Insecurity  . Worried About Charity fundraiser in the Last Year: Never true  . Ran Out of Food in the Last Year: Never true  Transportation Needs: No Transportation Needs  . Lack of Transportation (Medical): No  . Lack of Transportation (Non-Medical): No  Physical Activity: Inactive  . Days of Exercise per Week: 0 days  . Minutes of  Exercise per Session: 0 min  Stress: No Stress Concern Present  . Feeling of Stress : Not at all  Social Connections: Moderately Isolated  . Frequency of Communication with Friends and Family: Three times a week  . Frequency of Social Gatherings with Friends and Family: Three times a week  . Attends Religious Services: Never  . Active Member of Clubs or Organizations: No  . Attends Archivist Meetings: Never  . Marital Status: Married    Tobacco Counseling Counseling given: Not Answered   Clinical Intake:  Pre-visit preparation completed: Yes  Pain : No/denies pain     Nutritional Risks: None Diabetes:  (Pre-diabetic)  How often do you need to have someone help you when you read instructions, pamphlets, or other written materials from your doctor or pharmacy?: 1 - Never  Diabetic? Pre-diabetic  Interpreter Needed?: No  Information entered by :: [facility], LPN   Activities of Daily Living In your present state of health, do  you have any difficulty performing the following activities: 04/27/2020 04/13/2020  Hearing? Y Y  Comment Has a hearing aid and wears a cochlear impant. -  Vision? N Y  Difficulty concentrating or making decisions? N N  Walking or climbing stairs? N N  Dressing or bathing? N N  Doing errands, shopping? N N  Preparing Food and eating ? N -  Using the Toilet? N -  In the past six months, have you accidently leaked urine? N -  Do you have problems with loss of bowel control? N -  Managing your Medications? N -  Managing your Finances? N -  Housekeeping or managing your Housekeeping? N -  Some recent data might be hidden    Patient Care Team: Birdie Sons, MD as PCP - General (Family Medicine) Bary Castilla, Forest Gleason, MD (General Surgery) Oneta Rack, MD (Dermatology) Throat, [place] [facility])  Indicate any recent Medical Services you may have received from other than [facility] providers in the  past year (date may be approximate).     Assessment:   This is a routine wellness examination for Terin.  Hearing/Vision screen No exam data present  Dietary issues and exercise activities discussed: Current Exercise Habits: The patient does not participate in regular exercise at present, Exercise limited by: None identified  Goals    . Exercise 3x per week (30 min per time)     Recommend to start exercising three times a week for 30 minutes.       Depression Screen PHQ 2/9 Scores 04/13/2020 04/09/2019 04/03/2018 04/03/2017 04/01/2016 03/02/2015  PHQ - 2 Score 0 0 0 0 0 0  PHQ- 9 Score - - - - - 1    Fall Risk Fall Risk  04/27/2020 04/13/2020 04/09/2019 04/10/2018 04/03/2018  Falls in the past year? 0 0 0 0 0  Comment - - - Emmi Telephone Survey: data to providers prior to load -  Number falls in past yr: 0 0 0 - -  Injury with Fall? 0 0 0 - -  Risk for fall due to : - No Fall Risks - - -  Follow up - Falls evaluation completed - - -    Any stairs in or around the home? Yes  If so, are there any without handrails? No  Home free of loose throw rugs in walkways, pet beds, electrical cords, etc? Yes  Adequate lighting in your home to reduce risk of falls? Yes   ASSISTIVE DEVICES UTILIZED TO PREVENT FALLS:  Life alert? No  Use of a cane, walker or w/c? No  Grab bars in the bathroom? Yes  Shower chair or bench in shower? Yes  Elevated toilet seat or a handicapped toilet? Yes    Cognitive Function: Declined today.         Immunizations Immunization History  Administered Date(s) Administered  . Influenza, High Dose Seasonal PF 03/02/2015, 03/07/2016, 02/23/2018  . Influenza-Unspecified 03/09/2017, 02/13/2019, 02/15/2020  . PFIZER SARS-COV-2 Vaccination 07/02/2019, 07/23/2019, 02/15/2020  . Pneumococcal Conjugate-13 02/26/2014  . Pneumococcal Polysaccharide-23 02/25/2013  . Td 03/20/2001  . Tdap 02/14/2011  . Zoster 06/19/2007  . Zoster Recombinat (Shingrix)  08/31/2017, 11/08/2017    TDAP status: Up to date Flu Vaccine status: Up to date Pneumococcal vaccine status: Up to date Covid-19 vaccine status: Completed vaccines  Qualifies for Shingles Vaccine? Yes   Zostavax completed Yes   Shingrix Completed?: Yes  Screening Tests Health Maintenance  Topic Date Due  .  FOOT EXAM  04/03/2018  . OPHTHALMOLOGY EXAM  08/18/2020  . HEMOGLOBIN A1C  10/11/2020  . TETANUS/TDAP  02/13/2021  . COLONOSCOPY  04/05/2023  . INFLUENZA VACCINE  Completed  . COVID-19 Vaccine  Completed  . PNA vac Low Risk Adult  Completed  . Hepatitis C Screening  Addressed    Health Maintenance  Health Maintenance Due  Topic Date Due  . FOOT EXAM  04/03/2018    Colorectal cancer screening: Completed 04/04/18. Repeat every 5 years  Lung Cancer Screening: (Low Dose CT Chest recommended if Age 82-80 years, 30 pack-year currently smoking OR have quit w/in 15years.) does not qualify.   Additional Screening:  Hepatitis C Screening: Up to date  Vision Screening: Recommended annual ophthalmology exams for early detection of glaucoma and other disorders of the eye. Is the patient up to date with their annual eye exam?  Yes  Who is the provider or what is the name of the office in which the patient attends annual eye exams? [facility] [employer] [facility]. If pt is not established with a provider, would they like to be referred to a provider to establish care? No .   Dental Screening: Recommended annual dental exams for proper oral hygiene  Community Resource Referral / Chronic Care Management: CRR required this visit?  No   CCM required this visit?  No      Plan:     I have personally reviewed and noted the following in the patient's chart:   . Medical and social history . Use of alcohol, tobacco or illicit drugs  . Current medications and supplements . Functional ability and status . Nutritional status . Physical activity . Advanced directives . List of other  physicians . Hospitalizations, surgeries, and ER visits in previous 12 months . Vitals . Screenings to include cognitive, depression, and falls . Referrals and appointments  In addition, I have reviewed and discussed with patient certain preventive protocols, quality metrics, and best practice recommendations. A written personalized care plan for preventive services as well as general preventive health recommendations were provided to patient.     Neely Cecena [place], [place]   53/06/9922   Nurse Notes: Pt needs a diabetic foot exam at next in office apt.

## 2020-04-27 ENCOUNTER — Ambulatory Visit (INDEPENDENT_AMBULATORY_CARE_PROVIDER_SITE_OTHER): Payer: Medicare Other

## 2020-04-27 ENCOUNTER — Other Ambulatory Visit: Payer: Self-pay

## 2020-04-27 DIAGNOSIS — Z0000 Encounter for general adult medical examination without abnormal findings: Secondary | ICD-10-CM | POA: Diagnosis not present

## 2020-04-27 DIAGNOSIS — H903 Sensorineural hearing loss, bilateral: Secondary | ICD-10-CM | POA: Diagnosis not present

## 2020-04-27 NOTE — Patient Instructions (Signed)
Mr. Adrian Thompson , Thank you for taking time to come for your Medicare Wellness Visit. I appreciate your ongoing commitment to your health goals. Please review the following plan we discussed and let me know if I can assist you in the future.   Screening recommendations/referrals: Colonoscopy: Up to date, due 03/2023 Recommended yearly ophthalmology/optometry visit for glaucoma screening and checkup Recommended yearly dental visit for hygiene and checkup  Vaccinations: Influenza vaccine: Done 02/15/20 Pneumococcal vaccine: Completed series Tdap vaccine: Up to date, due 01/2021 Shingles vaccine: Completed series    Advanced directives: Currently on file.  Conditions/risks identified: Recommend to start exercising three times a week for 30 minutes.   Next appointment: 08/11/20 @ 9:40 AM with Dr Caryn Section   Preventive Care 15 Years and Older, Male Preventive care refers to lifestyle choices and visits with your health care provider that can promote health and wellness. What does preventive care include?  A yearly physical exam. This is also called an annual well check.  Dental exams once or twice a year.  Routine eye exams. Ask your health care provider how often you should have your eyes checked.  Personal lifestyle choices, including:  Daily care of your teeth and gums.  Regular physical activity.  Eating a healthy diet.  Avoiding tobacco and drug use.  Limiting alcohol use.  Practicing safe sex.  Taking low doses of aspirin every day.  Taking vitamin and mineral supplements as recommended by your health care provider. What happens during an annual well check? The services and screenings done by your health care provider during your annual well check will depend on your age, overall health, lifestyle risk factors, and family history of disease. Counseling  Your health care provider may ask you questions about your:  Alcohol use.  Tobacco use.  Drug use.  Emotional  well-being.  Home and relationship well-being.  Sexual activity.  Eating habits.  History of falls.  Memory and ability to understand (cognition).  Work and work Statistician. Screening  You may have the following tests or measurements:  Height, weight, and BMI.  Blood pressure.  Lipid and cholesterol levels. These may be checked every 5 years, or more frequently if you are over 21 years old.  Skin check.  Lung cancer screening. You may have this screening every year starting at age 36 if you have a 30-pack-year history of smoking and currently smoke or have quit within the past 15 years.  Fecal occult blood test (FOBT) of the stool. You may have this test every year starting at age 57.  Flexible sigmoidoscopy or colonoscopy. You may have a sigmoidoscopy every 5 years or a colonoscopy every 10 years starting at age 81.  Prostate cancer screening. Recommendations will vary depending on your family history and other risks.  Hepatitis C blood test.  Hepatitis B blood test.  Sexually transmitted disease (STD) testing.  Diabetes screening. This is done by checking your blood sugar (glucose) after you have not eaten for a while (fasting). You may have this done every 1-3 years.  Abdominal aortic aneurysm (AAA) screening. You may need this if you are a current or former smoker.  Osteoporosis. You may be screened starting at age 74 if you are at high risk. Talk with your health care provider about your test results, treatment options, and if necessary, the need for more tests. Vaccines  Your health care provider may recommend certain vaccines, such as:  Influenza vaccine. This is recommended every year.  Tetanus, diphtheria, and acellular  pertussis (Tdap, Td) vaccine. You may need a Td booster every 10 years.  Zoster vaccine. You may need this after age 19.  Pneumococcal 13-valent conjugate (PCV13) vaccine. One dose is recommended after age 42.  Pneumococcal  polysaccharide (PPSV23) vaccine. One dose is recommended after age 45. Talk to your health care provider about which screenings and vaccines you need and how often you need them. This information is not intended to replace advice given to you by your health care provider. Make sure you discuss any questions you have with your health care provider. Document Released: 06/05/2015 Document Revised: 01/27/2016 Document Reviewed: 03/10/2015 Elsevier Interactive Patient Education  2017 [place] Prevention in the Home Falls can cause injuries. They can happen to people of all ages. There are many things you can do to make your home safe and to help prevent falls. What can I do on the outside of my home?  Regularly fix the edges of walkways and driveways and fix any cracks.  Remove anything that might make you trip as you walk through a door, such as a raised step or threshold.  Trim any bushes or trees on the path to your home.  Use bright outdoor lighting.  Clear any walking paths of anything that might make someone trip, such as rocks or tools.  Regularly check to see if handrails are loose or broken. Make sure that both sides of any steps have handrails.  Any raised decks and porches should have guardrails on the edges.  Have any leaves, snow, or ice cleared regularly.  Use sand or salt on walking paths during winter.  Clean up any spills in your garage right away. This includes oil or grease spills. What can I do in the bathroom?  Use night lights.  Install grab bars by the toilet and in the tub and shower. Do not use towel bars as grab bars.  Use non-skid mats or decals in the tub or shower.  If you need to sit down in the shower, use a plastic, non-slip stool.  Keep the floor dry. Clean up any water that spills on the floor as soon as it happens.  Remove soap buildup in the tub or shower regularly.  Attach bath mats securely with double-sided non-slip rug  tape.  Do not have throw rugs and other things on the floor that can make you trip. What can I do in the bedroom?  Use night lights.  Make sure that you have a light by your bed that is easy to reach.  Do not use any sheets or blankets that are too big for your bed. They should not hang down onto the floor.  Have a firm chair that has side arms. You can use this for support while you get dressed.  Do not have throw rugs and other things on the floor that can make you trip. What can I do in the kitchen?  Clean up any spills right away.  Avoid walking on wet floors.  Keep items that you use a lot in easy-to-reach places.  If you need to reach something above you, use a strong step stool that has a grab bar.  Keep electrical cords out of the way.  Do not use floor polish or wax that makes floors slippery. If you must use wax, use non-skid floor wax.  Do not have throw rugs and other things on the floor that can make you trip. What can I do with my stairs?  Do not leave any items on the stairs.  Make sure that there are handrails on both sides of the stairs and use them. Fix handrails that are broken or loose. Make sure that handrails are as long as the stairways.  Check any carpeting to make sure that it is firmly attached to the stairs. Fix any carpet that is loose or worn.  Avoid having throw rugs at the top or bottom of the stairs. If you do have throw rugs, attach them to the floor with carpet tape.  Make sure that you have a light switch at the top of the stairs and the bottom of the stairs. If you do not have them, ask someone to add them for you. What else can I do to help prevent falls?  Wear shoes that:  Do not have high heels.  Have rubber bottoms.  Are comfortable and fit you well.  Are closed at the toe. Do not wear sandals.  If you use a stepladder:  Make sure that it is fully opened. Do not climb a closed stepladder.  Make sure that both sides of the  stepladder are locked into place.  Ask someone to hold it for you, if possible.  Clearly mark and make sure that you can see:  Any grab bars or handrails.  First and last steps.  Where the edge of each step is.  Use tools that help you move around (mobility aids) if they are needed. These include:  Canes.  Walkers.  Scooters.  Crutches.  Turn on the lights when you go into a dark area. Replace any light bulbs as soon as they burn out.  Set up your furniture so you have a clear path. Avoid moving your furniture around.  If any of your floors are uneven, fix them.  If there are any pets around you, be aware of where they are.  Review your medicines with your doctor. Some medicines can make you feel dizzy. This can increase your chance of falling. Ask your doctor what other things that you can do to help prevent falls. This information is not intended to replace advice given to you by your health care provider. Make sure you discuss any questions you have with your health care provider. Document Released: 03/05/2009 Document Revised: 10/15/2015 Document Reviewed: 06/13/2014 Elsevier Interactive Patient Education  2017 [employer].

## 2020-06-02 ENCOUNTER — Other Ambulatory Visit: Payer: Self-pay | Admitting: Family Medicine

## 2020-06-02 DIAGNOSIS — E876 Hypokalemia: Secondary | ICD-10-CM

## 2020-06-22 ENCOUNTER — Other Ambulatory Visit: Payer: Medicare Other

## 2020-06-22 DIAGNOSIS — Z20822 Contact with and (suspected) exposure to covid-19: Secondary | ICD-10-CM | POA: Diagnosis not present

## 2020-06-24 LAB — SARS-COV-2, NAA 2 DAY TAT

## 2020-06-24 LAB — NOVEL CORONAVIRUS, NAA: SARS-CoV-2, NAA: DETECTED — AB

## 2020-06-25 ENCOUNTER — Telehealth: Payer: Self-pay | Admitting: Oncology

## 2020-06-25 ENCOUNTER — Encounter: Payer: Self-pay | Admitting: Oncology

## 2020-06-25 ENCOUNTER — Other Ambulatory Visit: Payer: Self-pay | Admitting: Oncology

## 2020-06-25 MED ORDER — MOLNUPIRAVIR EUA 200MG CAPSULE: 4.0000 | ORAL_CAPSULE | Freq: Two times a day (BID) | ORAL | 0 refills | Status: AC

## 2020-06-25 NOTE — Telephone Encounter (Signed)
Outpatient Oral COVID Treatment Note  I connected with Adrian Thompson on 06/25/2020/1:12 PM by telephone and verified that I am speaking with the correct person using two identifiers.  I discussed the limitations, risks, security, and privacy concerns of performing an evaluation and management service by telephone and the availability of in person appointments. I also discussed with the patient that there may be a patient responsible charge related to this service. The patient expressed understanding and agreed to proceed.  Patient location: Home Provider location: Clinic   Diagnosis: COVID-19 infection  Purpose of visit: Discussion of potential use of Molnupiravir or Paxlovid, a new treatment for mild to moderate COVID-19 viral infection in non-hospitalized patients.   Subjective: Patient is a 75 y.o. male who has been diagnosed with COVID 19 viral infection.  Their symptoms began on 06/21/20 with cough, congestion, fever.    Past Medical History:  Diagnosis Date  . Diabetes mellitus without complication ([place])    type 2  . Gout   . History of chicken pox   . History of measles   . History of mumps   . Hyperlipidemia   . Hypertension     No Known Allergies   Current Outpatient Medications:  .  ACCU-CHEK AVIVA PLUS test strip, USE ONCE EVERY DAY AS DIRECTED, Disp: 100 strip, Rfl: 3 .  amLODipine (NORVASC) 10 MG tablet, TAKE 1 TABLET EVERY DAY, Disp: 90 tablet, Rfl: 3 .  aspirin 81 MG tablet, Take 1 tablet by mouth daily., Disp: , Rfl:  .  atenolol (TENORMIN) 50 MG tablet, TAKE 1 TABLET EVERY DAY, Disp: 90 tablet, Rfl: 4 .  azelastine (ASTELIN) 0.1 % nasal spray, Place 2 sprays into both nostrils 2 (two) times daily. Use in each nostril as directed, Disp: 90 mL, Rfl: 3 .  finasteride (PROSCAR) 5 MG tablet, Take 1 tablet (5 mg total) by mouth daily., Disp: 90 tablet, Rfl: 4 .  glucosamine-chondroitin 500-400 MG tablet, Take 1 tablet by mouth daily., Disp: , Rfl:  .  indomethacin  (INDOCIN) 25 MG capsule, Take 1 capsule (25 mg total) by mouth 3 (three) times daily with meals as needed. Up to 3 times a day, Disp: 90 capsule, Rfl: 0 .  losartan-hydrochlorothiazide (HYZAAR) 100-25 MG tablet, TAKE 1 TABLET EVERY DAY, Disp: 90 tablet, Rfl: 3 .  meloxicam (MOBIC) 15 MG tablet, Take 1 tablet (15 mg total) by mouth daily as needed for pain. As needed, Disp: 90 tablet, Rfl: 2 .  Misc Natural Products (BLACK CHERRY CONCENTRATE PO), Take 1 tablet by mouth 2 (two) times daily. , Disp: , Rfl:  .  montelukast (SINGULAIR) 10 MG tablet, TAKE 1 TABLET EVERY DAY (Patient not taking: Reported on 04/27/2020), Disp: 90 tablet, Rfl: 4 .  Multiple Vitamins-Minerals (MULTIVITAMIN ADULT PO), Take 1 tablet by mouth daily., Disp: , Rfl:  .  Omega-3 Fatty Acids (FISH OIL) 1000 MG CPDR, Take 1 capsule by mouth daily., Disp: , Rfl:  .  omeprazole (PRILOSEC) 20 MG capsule, TAKE 1 CAPSULE EVERY DAY, Disp: 90 capsule, Rfl: 4 .  potassium chloride SA (KLOR-CON) 20 MEQ tablet, TAKE 1 TABLET EVERY DAY, Disp: 90 tablet, Rfl: 1 .  pravastatin (PRAVACHOL) 20 MG tablet, TAKE 1 TABLET EVERY DAY, Disp: 90 tablet, Rfl: 4 .  tamsulosin (FLOMAX) 0.4 MG CAPS capsule, Take 1 capsule (0.4 mg total) by mouth daily., Disp: 90 capsule, Rfl: 4 .  Triamcinolone Acetonide (NASACORT AQ NA), Place into the nose daily.  (Patient not taking: Reported on  04/27/2020), Disp: , Rfl:   Objective: Patient sounds well and stable..  They are in no apparent distress.  Breathing is non labored.  Mood and behavior are normal.  Laboratory Data:  Recent Results (from the past 2160 hour(s))  CBC     Status: None   Collection Time: 04/13/20 10:01 AM  Result Value Ref Range   WBC 5.9 3.4 - 10.8 x10E3/uL   RBC 5.34 4.14 - 5.80 x10E6/uL   Hemoglobin 16.0 13.0 - 17.7 g/dL   Hematocrit 47.1 37.5 - 51.0 %   MCV 88 79 - 97 fL   MCH 30.0 26.6 - 33.0 pg   MCHC 34.0 31.5 - 35.7 g/dL   RDW 12.5 11.6 - 15.4 %   Platelets 230 150 - 450 x10E3/uL   Comprehensive metabolic panel     Status: Abnormal   Collection Time: 04/13/20 10:01 AM  Result Value Ref Range   Glucose 136 (H) 65 - 99 mg/dL   BUN 13 8 - 27 mg/dL   Creatinine, Ser 0.98 0.76 - 1.27 mg/dL   GFR calc non Af Amer 76 >59 mL/min/1.73   GFR calc Af Amer 88 >59 mL/min/1.73    Comment: **In accordance with recommendations from the NKF-ASN Task force,**   [facility] is in the process of updating its eGFR calculation to the   2021 CKD-EPI creatinine equation that estimates kidney function   without a race variable.    BUN/Creatinine Ratio 13 10 - 24   Sodium 143 134 - 144 mmol/L   Potassium 3.9 3.5 - 5.2 mmol/L   Chloride 102 96 - 106 mmol/L   CO2 26 20 - 29 mmol/L   Calcium 9.6 8.6 - 10.2 mg/dL   Total Protein 6.9 6.0 - 8.5 g/dL   Albumin 4.4 3.7 - 4.7 g/dL   Globulin, Total 2.5 1.5 - 4.5 g/dL   Albumin/Globulin Ratio 1.8 1.2 - 2.2   Bilirubin Total 0.7 0.0 - 1.2 mg/dL   Alkaline Phosphatase 64 44 - 121 IU/L    Comment:               **Please note reference interval change**   AST 14 0 - 40 IU/L   ALT 12 0 - 44 IU/L  Lipid panel     Status: None   Collection Time: 04/13/20 10:01 AM  Result Value Ref Range   Cholesterol, Total 156 100 - 199 mg/dL   Triglycerides 109 0 - 149 mg/dL   HDL 66 >39 mg/dL   VLDL Cholesterol Cal 19 5 - 40 mg/dL   LDL Chol Calc (NIH) 71 0 - 99 mg/dL   Chol/HDL Ratio 2.4 0.0 - 5.0 ratio    Comment:                                   T. Chol/HDL Ratio                                             Men  Women                               1/2 Avg.Risk  3.4    3.3  Avg.Risk  5.0    4.4                                2X Avg.Risk  9.6    7.1                                3X Avg.Risk 23.4   11.0   Hemoglobin A1c     Status: Abnormal   Collection Time: 04/13/20 10:01 AM  Result Value Ref Range   Hgb A1c MFr Bld 6.3 (H) 4.8 - 5.6 %    Comment:          Prediabetes: 5.7 - 6.4          Diabetes: >6.4           Glycemic control for adults with diabetes: <7.0    Est. average glucose Bld gHb Est-mCnc 134 mg/dL  PSA     Status: None   Collection Time: 04/13/20 10:01 AM  Result Value Ref Range   Prostate Specific Ag, Serum 1.8 0.0 - 4.0 ng/mL    Comment: Roche ECLIA methodology. According to the American Urological Association, Serum PSA should decrease and remain at undetectable levels after radical prostatectomy. The AUA defines biochemical recurrence as an initial PSA value 0.2 ng/mL or greater followed by a subsequent confirmatory PSA value 0.2 ng/mL or greater. Values obtained with different assay methods or kits cannot be used interchangeably. Results cannot be interpreted as absolute evidence of the presence or absence of malignant disease.   Novel Coronavirus, NAA ([facility])     Status: Abnormal   Collection Time: 06/22/20  6:01 PM   Specimen: Nasopharyngeal(NP) swabs in vial transport medium   Nasopharynge  Result Value Ref Range   SARS-CoV-2, NAA Detected (A) Not Detected    Comment: Patients who have a positive COVID-19 test result may now have treatment options. Treatment options are available for patients with mild to moderate symptoms and for hospitalized patients. Visit our website at [email or web address] for resources and information. This nucleic acid amplification test was developed and its performance characteristics determined by [employer]. Nucleic acid amplification tests include RT-PCR and TMA. This test has not been FDA cleared or approved. This test has been authorized by FDA under an Emergency Use Authorization (EUA). This test is only authorized for the duration of time the declaration that circumstances exist justifying the authorization of the emergency use of in vitro diagnostic tests for detection of SARS-CoV-2 virus and/or diagnosis of COVID-19 infection under section 564(b)(1) of the Act, 21 U.S.C. 893YBO-1(B) (1), unless the  authorization is terminated or revoked sooner. When diagnostic testing is negativ e, the possibility of a false negative result should be considered in the context of a patient's recent exposures and the presence of clinical signs and symptoms consistent with COVID-19. An individual without symptoms of COVID-19 and who is not shedding SARS-CoV-2 virus would expect to have a negative (not detected) result in this assay.   SARS-COV-2, NAA 2 DAY TAT     Status: None   Collection Time: 06/22/20  6:01 PM   Nasopharynge  Result Value Ref Range   SARS-CoV-2, NAA 2 DAY TAT Performed      Assessment: 75 y.o. male with mild/moderate COVID 19 viral infection diagnosed on 06/22/20 at high risk for progression to severe COVID 19.  Plan:  This patient is a 75  y.o. male that meets the following criteria for Emergency Use Authorization of: Molnupiravir  1. Age >18 yr 2. SARS-COV-2 positive test 3. Symptom onset < 5 days 4. Mild-to-moderate COVID disease with high risk for severe progression to hospitalization or death   I have spoken and communicated the following to the patient or parent/caregiver regarding: 1. Molnupiravir is an unapproved drug that is authorized for use under an [employer] production planner.  2. There are no adequate, approved, available products for the treatment of COVID-19 in adults who have mild-to-moderate COVID-19 and are at high risk for progressing to severe COVID-19, including hospitalization or death. 3. Other therapeutics are currently authorized. For additional information on all products authorized for treatment or prevention of COVID-19, please see [email or web address].  4. There are benefits and risks of taking this treatment as outlined in the "Fact Sheet for Patients and Caregivers."  5. "Fact Sheet for Patients and Caregivers" was reviewed with patient. A hard copy  will be provided to patient from pharmacy prior to the patient receiving treatment. 6. Patients should continue to self-isolate and use infection control measures (e.g., wear mask, isolate, social distance, avoid sharing personal items, clean and disinfect "high touch" surfaces, and frequent handwashing) according to CDC guidelines.  7. The patient or parent/caregiver has the option to accept or refuse treatment. 8. [facility] has established a pregnancy surveillance program. 9. Females of childbearing potential should use a reliable method of contraception correctly and consistently, as applicable, for the duration of treatment and for 4 days after the last dose of Molnupiravir. 73. Males of reproductive potential who are sexually active with females of childbearing potential should use a reliable method of contraception correctly and consistently during treatment and for at least 3 months after the last dose. 11. Pregnancy status and risk was assessed. Patient verbalized understanding of precautions.   After reviewing above information with the patient, the patient agrees to receive molnupiravir.  Follow up instructions:    . Take prescription BID x 5 days as directed . Reach out to pharmacist for counseling on medication if desired . For concerns regarding further COVID symptoms please follow up with your PCP or urgent care . For urgent or life-threatening issues, seek care at your local emergency department  The patient was provided an opportunity to ask questions, and all were answered. The patient agreed with the plan and demonstrated an understanding of the instructions.   Script sent to [facility] and opted to pick up RX.  The patient was advised to call their PCP or seek an in-person evaluation if the symptoms worsen or if the condition fails to improve as anticipated.   I provided 15 minutes of non face-to-face telephone visit time during this encounter, and >  50% was spent counseling as documented under my assessment & plan.  Jacquelin Hawking, NP 06/25/2020 /1:12 PM

## 2020-07-24 ENCOUNTER — Other Ambulatory Visit: Payer: Self-pay | Admitting: Family Medicine

## 2020-07-25 NOTE — Telephone Encounter (Signed)
Requested Prescriptions  Pending Prescriptions Disp Refills  . ACCU-CHEK AVIVA PLUS test strip [Pharmacy Med Name: [facility] 100S] 100 strip 3    Sig: USE ONCE EVERY DAY AS DIRECTED     Endocrinology: Diabetes - Testing Supplies Passed - 07/24/2020  6:10 PM      Passed - Valid encounter within last 12 months    Recent Outpatient Visits          3 months ago Essential (primary) hypertension   [facility], MD   9 months ago Type 2 diabetes mellitus without complication, without long-term current use of insulin ([place])   [facility] Birdie Sons, MD   1 year ago Type 2 diabetes mellitus without complication, without long-term current use of insulin ([place])   [facility] Birdie Sons, MD   1 year ago Type 2 diabetes mellitus without complication, without long-term current use of insulin ([place])   [facility] Birdie Sons, MD   2 years ago Type 2 diabetes mellitus without complication, without long-term current use of insulin [facility])   [place], Kirstie Peri, MD      Future Appointments            In 2 weeks Fisher, Kirstie Peri, MD [facility]

## 2020-08-11 ENCOUNTER — Ambulatory Visit: Payer: Self-pay | Admitting: Family Medicine

## 2020-08-12 ENCOUNTER — Ambulatory Visit (INDEPENDENT_AMBULATORY_CARE_PROVIDER_SITE_OTHER): Payer: Medicare Other | Admitting: Family Medicine

## 2020-08-12 ENCOUNTER — Other Ambulatory Visit: Payer: Self-pay

## 2020-08-12 ENCOUNTER — Telehealth: Payer: Self-pay

## 2020-08-12 VITALS — BP 146/73 | HR 50 | Ht 72.0 in | Wt 226.0 lb

## 2020-08-12 DIAGNOSIS — E119 Type 2 diabetes mellitus without complications: Secondary | ICD-10-CM | POA: Diagnosis not present

## 2020-08-12 DIAGNOSIS — I10 Essential (primary) hypertension: Secondary | ICD-10-CM

## 2020-08-12 LAB — POCT GLYCOSYLATED HEMOGLOBIN (HGB A1C)
Estimated Average Glucose: 137
Hemoglobin A1C: 6.4 % — AB (ref 4.0–5.6)

## 2020-08-12 NOTE — Progress Notes (Signed)
Established patient visit   Patient: Adrian Thompson   DOB: 05/11/1946   75 y.o. Male  MRN: [id number] Visit Date: 08/12/2020  Today's healthcare provider: Lelon Huh, MD   Chief Complaint  Patient presents with  . Hypertension  . Diabetes   Subjective    HPI  Hypertension, follow-up  BP Readings from Last 3 Encounters:  08/12/20 (!) 146/73  04/13/20 (!) 155/86  10/08/19 140/80   Wt Readings from Last 3 Encounters:  08/12/20 226 lb (102.5 kg)  04/13/20 219 lb (99.3 kg)  10/08/19 221 lb 6.4 oz (100.4 kg)     He was last seen for hypertension 4 months ago.  BP at that visit was 155/86. Management since that visit includes advising patient to work on improving diet, exercising and losing weight. Recheck blood pressure in about 4 months. Advised may need to add another medication if not improved at follow up.  He reports excellent compliance with treatment. He is not having side effects.  He is following a Regular diet. He is exercising. [employer] work He does not smoke.  Use of agents associated with hypertension: none.   Outside blood pressures are 130s-140s/80s. Symptoms: No chest pain No chest pressure  No palpitations No syncope  Yes dyspnea No orthopnea  No paroxysmal nocturnal dyspnea No lower extremity edema   Pertinent labs: Lab Results  Component Value Date   CHOL 156 04/13/2020   HDL 66 04/13/2020   LDLCALC 71 04/13/2020   TRIG 109 04/13/2020   CHOLHDL 2.4 04/13/2020   Lab Results  Component Value Date   NA 143 04/13/2020   K 3.9 04/13/2020   CREATININE 0.98 04/13/2020   GFRNONAA 76 04/13/2020   GFRAA 88 04/13/2020   GLUCOSE 136 (H) 04/13/2020     The 10-year ASCVD risk score Mikey Bussing DC Jr., et al., 2013) is: 43.6%   ---------------------------------------------------------------------------------------------------  Diabetes Mellitus Type II, Follow-up  Lab Results  Component Value Date   HGBA1C 6.4 (A) 08/12/2020   HGBA1C 6.3 (H)  04/13/2020   HGBA1C 6.2 (A) 10/08/2019   Wt Readings from Last 3 Encounters:  08/12/20 226 lb (102.5 kg)  04/13/20 219 lb (99.3 kg)  10/08/19 221 lb 6.4 oz (100.4 kg)   Last seen for diabetes 4 months ago.  Management since then includes continue same medications. He reports excellent compliance with treatment. He is not having side effects.  Symptoms: Yes fatigue - sob since covid in Jan. No foot ulcerations  No appetite changes No nausea  No paresthesia of the feet  No polydipsia  No polyuria Yes visual disturbances - when reading significantly  No vomiting     Home blood sugar records: fasting range: 120s-130s  Episodes of hypoglycemia? No     Current insulin regiment: none Most Recent Eye Exam: 08/19/2019 Current exercise: yard work Current diet habits: well balanced  Pertinent Labs: Lab Results  Component Value Date   CHOL 156 04/13/2020   HDL 66 04/13/2020   LDLCALC 71 04/13/2020   TRIG 109 04/13/2020   CHOLHDL 2.4 04/13/2020   Lab Results  Component Value Date   NA 143 04/13/2020   K 3.9 04/13/2020   CREATININE 0.98 04/13/2020   GFRNONAA 76 04/13/2020   GFRAA 88 04/13/2020   GLUCOSE 136 (H) 04/13/2020     ---------------------------------------------------------------------------------------------------     Medications: Outpatient Medications Prior to Visit  Medication Sig  . ACCU-CHEK AVIVA PLUS test strip USE ONCE EVERY DAY AS DIRECTED  . amLODipine (  NORVASC) 10 MG tablet TAKE 1 TABLET EVERY DAY  . aspirin 81 MG tablet Take 1 tablet by mouth daily.  Marland Kitchen atenolol (TENORMIN) 50 MG tablet TAKE 1 TABLET EVERY DAY  . azelastine (ASTELIN) 0.1 % nasal spray Place 2 sprays into both nostrils 2 (two) times daily. Use in each nostril as directed  . finasteride (PROSCAR) 5 MG tablet Take 1 tablet (5 mg total) by mouth daily.  Marland Kitchen glucosamine-chondroitin 500-400 MG tablet Take 1 tablet by mouth daily.  . indomethacin (INDOCIN) 25 MG capsule Take 1 capsule (25 mg  total) by mouth 3 (three) times daily with meals as needed. Up to 3 times a day  . losartan-hydrochlorothiazide (HYZAAR) 100-25 MG tablet TAKE 1 TABLET EVERY DAY  . meloxicam (MOBIC) 15 MG tablet Take 1 tablet (15 mg total) by mouth daily as needed for pain. As needed  . Misc Natural Products (BLACK CHERRY CONCENTRATE PO) Take 1 tablet by mouth 2 (two) times daily.   . montelukast (SINGULAIR) 10 MG tablet TAKE 1 TABLET EVERY DAY  . Multiple Vitamins-Minerals (MULTIVITAMIN ADULT PO) Take 1 tablet by mouth daily.  . Omega-3 Fatty Acids (FISH OIL) 1000 MG CPDR Take 1 capsule by mouth daily.  Marland Kitchen omeprazole (PRILOSEC) 20 MG capsule TAKE 1 CAPSULE EVERY DAY  . potassium chloride SA (KLOR-CON) 20 MEQ tablet TAKE 1 TABLET EVERY DAY  . pravastatin (PRAVACHOL) 20 MG tablet TAKE 1 TABLET EVERY DAY  . tamsulosin (FLOMAX) 0.4 MG CAPS capsule Take 1 capsule (0.4 mg total) by mouth daily.  . Triamcinolone Acetonide (NASACORT AQ NA) Place into the nose daily.   No facility-administered medications prior to visit.    Review of Systems     Objective    BP (!) 146/73 (BP Location: Right Arm, Patient Position: Sitting, Cuff Size: Large)   Pulse (!) 50   Ht 6' (1.829 m)   Wt 226 lb (102.5 kg)   SpO2 99%   BMI 30.65 kg/m     Physical Exam    General: Appearance:    Overweight male in no acute distress  Eyes:    PERRL, conjunctiva/corneas clear, EOM's intact       Lungs:     Clear to auscultation bilaterally, respirations unlabored  Heart:    Bradycardic. Normal rhythm. No murmurs, rubs, or gallops.   MS:   All extremities are intact.   Neurologic:   Awake, alert, oriented x 3. No apparent focal neurological           defect.         Results for orders placed or performed in visit on 08/12/20  POCT HgB A1C  Result Value Ref Range   Hemoglobin A1C 6.4 (A) 4.0 - 5.6 %   Estimated Average Glucose 137     Assessment & Plan     1. Type 2 diabetes mellitus without complication, without long-term  current use of insulin (HCC) Well controlled.  Continue current medications.  Follow up 4 months.   2. Essential (primary) hypertension Home blood pressures consistently in the 130s. Continue current medications.  Recheck 4 months.          The entirety of the information documented in the History of Present Illness, Review of Systems and Physical Exam were personally obtained by me. Portions of this information were initially documented by the CMA and reviewed by me for thoroughness and accuracy.      Lelon Huh, MD  [facility] [phone] (phone) [phone] (fax)  [facility]

## 2020-08-12 NOTE — Telephone Encounter (Signed)
I called and spoke with patients wife and answered her questions. She didn't have any further questions afterwards.

## 2020-08-12 NOTE — Telephone Encounter (Signed)
Copied from [place] [id number]. Topic: Appointment Scheduling - Scheduling Inquiry for Clinic >> Aug 12, 2020  1:09 PM Lennox Solders wrote: Reason for CRM:Pt wife is calling and would like to know the reason cpe was not sch with dr Caryn Section. Pt has awv sch for 05-04-2021. I told pt wife maybe due to medicare is his primary insurance she stated last year he had awv with wellness coach and cpe with dr Caryn Section

## 2020-08-25 DIAGNOSIS — Z45321 Encounter for adjustment and management of cochlear device: Secondary | ICD-10-CM | POA: Diagnosis not present

## 2020-08-25 DIAGNOSIS — H903 Sensorineural hearing loss, bilateral: Secondary | ICD-10-CM | POA: Diagnosis not present

## 2020-08-31 ENCOUNTER — Other Ambulatory Visit: Payer: Self-pay | Admitting: Family Medicine

## 2020-09-17 ENCOUNTER — Other Ambulatory Visit: Payer: Self-pay | Admitting: Family Medicine

## 2020-09-17 NOTE — Telephone Encounter (Signed)
Requested medication (s) are due for refill today: expired medications  Requested medication (s) are on the active medication list: yes   Last refill:  08/18/19 90 4 refills Prilosec, 07/25/19 #90 4 refills atenolol  Future visit scheduled: yes in 2 months  Notes to clinic:  medications expired, do you want to renew Rx?     Requested Prescriptions  Pending Prescriptions Disp Refills   omeprazole (PRILOSEC) 20 MG capsule [Pharmacy Med Name: OMEPRAZOLE 20 MG Capsule Delayed Release] 90 capsule 4    Sig: TAKE [place] DAY      Gastroenterology: Proton Pump Inhibitors Passed - 09/17/2020  4:08 PM      Passed - Valid encounter within last 12 months    Recent Outpatient Visits           1 month ago Type 2 diabetes mellitus without complication, without long-term current use of insulin ([place])   [facility] Birdie Sons, MD   5 months ago Essential (primary) hypertension   [facility], MD   11 months ago Type 2 diabetes mellitus without complication, without long-term current use of insulin ([place])   [facility] Birdie Sons, MD   1 year ago Type 2 diabetes mellitus without complication, without long-term current use of insulin ([place])   [facility] Birdie Sons, MD   1 year ago Type 2 diabetes mellitus without complication, without long-term current use of insulin ([place])   [place], Kirstie Peri, MD       Future Appointments             In 2 months Fisher, Kirstie Peri, MD [facility]               atenolol (TENORMIN) 50 MG tablet [Pharmacy Med Name: ATENOLOL 50 MG Tablet] 90 tablet 4    Sig: TAKE 1 TABLET EVERY DAY      Cardiovascular:  Beta Blockers Failed - 09/17/2020  4:08 PM      Failed - Last BP in normal range    BP Readings from Last 1 Encounters:  08/12/20 (!) 146/73          Passed - Last Heart Rate in normal range    Pulse Readings  from Last 1 Encounters:  08/12/20 (!) 50          Passed - Valid encounter within last 6 months    Recent Outpatient Visits           1 month ago Type 2 diabetes mellitus without complication, without long-term current use of insulin ([place])   [facility] Birdie Sons, MD   5 months ago Essential (primary) hypertension   [facility] Birdie Sons, MD   11 months ago Type 2 diabetes mellitus without complication, without long-term current use of insulin ([place])   [facility] Birdie Sons, MD   1 year ago Type 2 diabetes mellitus without complication, without long-term current use of insulin [facility])   [facility] Birdie Sons, MD   1 year ago Type 2 diabetes mellitus without complication, without long-term current use of insulin [facility])   [facility] Birdie Sons, MD       Future Appointments             In 2 months Fisher, Kirstie Peri, MD [facility] [place]  Signed Prescriptions Disp Refills   tamsulosin (FLOMAX) 0.4 MG CAPS capsule 90 capsule 3    Sig: TAKE 1 CAPSULE (0.4 MG TOTAL) BY MOUTH DAILY.      Urology: Alpha-Adrenergic Blocker Failed - 09/17/2020  4:08 PM      Failed - Last BP in normal range    BP Readings from Last 1 Encounters:  08/12/20 (!) 146/73          Passed - Valid encounter within last 12 months    Recent Outpatient Visits           1 month ago Type 2 diabetes mellitus without complication, without long-term current use of insulin ([place])   [facility] Birdie Sons, MD   5 months ago Essential (primary) hypertension   [facility] Birdie Sons, MD   11 months ago Type 2 diabetes mellitus without complication, without long-term current use of insulin ([place])   [facility] Birdie Sons, MD   1 year ago Type 2 diabetes mellitus without complication, without long-term current use of insulin  [facility])   [facility] Birdie Sons, MD   1 year ago Type 2 diabetes mellitus without complication, without long-term current use of insulin [facility])   [facility] Birdie Sons, MD       Future Appointments             In 2 months Fisher, Kirstie Peri, MD [facility] [place]

## 2020-09-17 NOTE — Telephone Encounter (Signed)
Future visit in 2 months  

## 2020-09-29 DIAGNOSIS — Z23 Encounter for immunization: Secondary | ICD-10-CM | POA: Diagnosis not present

## 2020-10-15 ENCOUNTER — Ambulatory Visit
Admission: EM | Admit: 2020-10-15 | Discharge: 2020-10-15 | Disposition: A | Discharge status: Home / Self Care | Payer: Medicare Other | Attending: Emergency Medicine | Admitting: Emergency Medicine

## 2020-10-15 ENCOUNTER — Ambulatory Visit (INDEPENDENT_AMBULATORY_CARE_PROVIDER_SITE_OTHER): Payer: Medicare Other

## 2020-10-15 ENCOUNTER — Other Ambulatory Visit: Payer: Self-pay

## 2020-10-15 ENCOUNTER — Encounter: Payer: Self-pay | Admitting: Emergency Medicine

## 2020-10-15 DIAGNOSIS — M545 Low back pain, unspecified: Secondary | ICD-10-CM | POA: Diagnosis not present

## 2020-10-15 DIAGNOSIS — R109 Unspecified abdominal pain: Secondary | ICD-10-CM | POA: Diagnosis not present

## 2020-10-15 DIAGNOSIS — R1032 Left lower quadrant pain: Secondary | ICD-10-CM | POA: Diagnosis not present

## 2020-10-15 LAB — BASIC METABOLIC PANEL
Anion gap: 8 (ref 5–15)
BUN: 14 mg/dL (ref 8–23)
CO2: 28 mmol/L (ref 22–32)
Calcium: 9.5 mg/dL (ref 8.9–10.3)
Chloride: 97 mmol/L — ABNORMAL LOW (ref 98–111)
Creatinine, Ser: 0.96 mg/dL (ref 0.61–1.24)
GFR, Estimated: >60 mL/min (ref >60)
Glucose, Bld: 177 mg/dL — ABNORMAL HIGH (ref 70–99)
Potassium: 3.2 mmol/L — ABNORMAL LOW (ref 3.5–5.1)
Sodium: 133 mmol/L — ABNORMAL LOW (ref 135–145)

## 2020-10-15 LAB — URINALYSIS, COMPLETE (UACMP) WITH MICROSCOPIC
Bacteria, UA: NONE SEEN
Bilirubin Urine: NEGATIVE
Glucose, UA: NEGATIVE mg/dL
Ketones, ur: NEGATIVE mg/dL
Leukocytes,Ua: NEGATIVE
Nitrite: NEGATIVE
Protein, ur: NEGATIVE mg/dL
Specific Gravity, Urine: 1.02 (ref 1.005–1.030)
pH: 5.5 (ref 5.0–8.0)

## 2020-10-15 LAB — CBC WITH DIFFERENTIAL/PLATELET
Abs Immature Granulocytes: 0.02 10*3/uL (ref 0.00–0.07)
Basophils Absolute: 0.1 10*3/uL (ref 0.0–0.1)
Basophils Relative: 1 %
Eosinophils Absolute: 0.1 10*3/uL (ref 0.0–0.5)
Eosinophils Relative: 1 %
HCT: 47.9 % (ref 39.0–52.0)
Hemoglobin: 16.2 g/dL (ref 13.0–17.0)
Immature Granulocytes: 0 %
Lymphocytes Relative: 27 %
Lymphs Abs: 2.2 10*3/uL (ref 0.7–4.0)
MCH: 29.3 pg (ref 26.0–34.0)
MCHC: 33.8 g/dL (ref 30.0–36.0)
MCV: 86.6 fL (ref 80.0–100.0)
Monocytes Absolute: 0.6 10*3/uL (ref 0.1–1.0)
Monocytes Relative: 8 %
Neutro Abs: 5 10*3/uL (ref 1.7–7.7)
Neutrophils Relative %: 63 %
Platelets: 245 10*3/uL (ref 150–400)
RBC: 5.53 MIL/uL (ref 4.22–5.81)
RDW: 13 % (ref 11.5–15.5)
WBC: 7.9 10*3/uL (ref 4.0–10.5)
nRBC: 0 % (ref 0.0–0.2)

## 2020-10-15 MED ORDER — BACLOFEN 10 MG PO TABS: 10.0000 mg | ORAL_TABLET | Freq: Three times a day (TID) | ORAL | 0 refills | Status: DC

## 2020-10-15 NOTE — ED Provider Notes (Signed)
[facility]    CSN: [id number] Arrival date & time: 10/15/20  0915      History   Chief Complaint Chief Complaint  Patient presents with  . Abdominal Pain  . Back Pain    HPI SHANNA STRENGTH is a 75 y.o. male.   HPI   75 year old male here for evaluation of abdomen and flank pain.  Patient reports that he has been having pain in the left side of his low back and in his left lower quadrant of his abdomen for the past 2 days.  This is not been associated with fever, nausea, vomiting, diarrhea, blood in stool, blood in his urine, pain with urination, or difficulty with starting his stream.  He has not had any injury and has not had a rash.  He reports that he might have some increased urinary frequency.  The pain hurts worse when he lays down at night and improves when he standing.  Past Medical History:  Diagnosis Date  . Diabetes mellitus without complication ([place])    type 2  . Gout   . History of chicken pox   . History of measles   . History of mumps   . Hyperlipidemia   . Hypertension     Patient Active Problem List   Diagnosis Date Noted  . Chronic left shoulder pain 04/13/2020  . Frequent PVCs 04/03/2018  . Hypokalemia 07/18/2016  . Cochlear implant in place 06/28/2016  . GERD (gastroesophageal reflux disease) 02/26/2015  . Hearing loss 02/26/2015  . HLD (hyperlipidemia) 02/26/2015  . Benign prostatic hyperplasia with urinary obstruction 09/04/2012  . Chronic prostatitis 06/04/2012  . Allergic rhinitis 02/08/2009  . Diverticulosis of colon without hemorrhage 02/26/2008  . Diabetes mellitus ([place]) 02/04/2008  . Gout 02/01/2007  . Essential (primary) hypertension 02/01/2007  . Irritable bowel syndrome 05/23/1998    Past Surgical History:  Procedure Laterality Date  . COCHLEAR IMPLANT Right 06/28/2016   Vira Blanco, MD [facility]  . COLONOSCOPY  2009  . COLONOSCOPY WITH PROPOFOL N/A 04/04/2018   Procedure: COLONOSCOPY WITH PROPOFOL;  Surgeon:  Robert Bellow, MD;  Location: [facility] ENDOSCOPY;  Service: Endoscopy;  Laterality: N/A;  . EXTERNAL EAR SURGERY  2018  . TONSILLECTOMY    . UPPER GI ENDOSCOPY  2009  . UPPER GI ENDOSCOPY  03/04/2011   [facility]; Excision of multiple gastric polyps       Home Medications    Prior to Admission medications   Medication Sig Start Date End Date Taking? Authorizing Provider  baclofen (LIORESAL) 10 MG tablet Take 1 tablet (10 mg total) by mouth 3 (three) times daily. 10/15/20  Yes Margarette Canada, NP  ACCU-CHEK AVIVA PLUS test strip USE ONCE EVERY DAY AS DIRECTED 07/25/20   Birdie Sons, MD  amLODipine (NORVASC) 10 MG tablet TAKE 1 TABLET EVERY DAY 11/28/19   Birdie Sons, MD  aspirin 81 MG tablet Take 1 tablet by mouth daily. 11/10/11   [provider]  atenolol (TENORMIN) 50 MG tablet TAKE 1 TABLET EVERY DAY 09/18/20   Birdie Sons, MD  azelastine (ASTELIN) 0.1 % nasal spray Place 2 sprays into both nostrils 2 (two) times daily. Use in each nostril as directed 04/13/20   Birdie Sons, MD  finasteride (PROSCAR) 5 MG tablet Take 1 tablet (5 mg total) by mouth daily. 12/02/19   Birdie Sons, MD  glucosamine-chondroitin 500-400 MG tablet Take 1 tablet by mouth daily. 02/07/07   [provider]  indomethacin (INDOCIN)  25 MG capsule Take 1 capsule (25 mg total) by mouth 3 (three) times daily with meals as needed. Up to 3 times a day 01/08/18   Birdie Sons, MD  losartan-hydrochlorothiazide [facility]) 100-25 MG tablet TAKE 1 TABLET EVERY DAY 12/13/19   Birdie Sons, MD  meloxicam (MOBIC) 15 MG tablet Take 1 tablet (15 mg total) by mouth daily as needed for pain. As needed 04/13/20   Birdie Sons, MD  Misc Natural Products (BLACK CHERRY CONCENTRATE PO) Take 1 tablet by mouth 2 (two) times daily.     [provider]  montelukast (SINGULAIR) 10 MG tablet TAKE 1 TABLET EVERY DAY 03/21/19   Birdie Sons, MD  Multiple Vitamins-Minerals (MULTIVITAMIN ADULT PO)  Take 1 tablet by mouth daily. 09/25/08   [provider]  Omega-3 Fatty Acids (FISH OIL) 1000 MG CPDR Take 1 capsule by mouth daily. 11/10/11   [provider]  omeprazole (PRILOSEC) 20 MG capsule TAKE 1 CAPSULE EVERY DAY 09/18/20   Birdie Sons, MD  potassium chloride SA (KLOR-CON) 20 MEQ tablet TAKE 1 TABLET EVERY DAY 06/02/20   Birdie Sons, MD  pravastatin (PRAVACHOL) 20 MG tablet TAKE 1 TABLET EVERY DAY 08/31/20   Birdie Sons, MD  tamsulosin (FLOMAX) 0.4 MG CAPS capsule TAKE 1 CAPSULE (0.4 MG TOTAL) BY MOUTH DAILY. 09/17/20   Birdie Sons, MD  Triamcinolone Acetonide (NASACORT AQ NA) Place into the nose daily.    [provider]    Family History Family History  Problem Relation Age of Onset  . Hypertension Mother   . Hypertension Sister     Social History Social History   Tobacco Use  . Smoking status: Never Smoker  . Smokeless tobacco: Former Systems developer    Types: Snuff, Chew  Vaping Use  . Vaping Use: Never used  Substance Use Topics  . Alcohol use: No    Alcohol/week: 0.0 standard drinks  . Drug use: No     Allergies   Patient has no known allergies.   Review of Systems Review of Systems  Constitutional: Negative for activity change, appetite change and fever.  Gastrointestinal: Positive for abdominal pain. Negative for blood in stool, constipation, diarrhea, nausea and vomiting.  Genitourinary: Positive for flank pain and frequency. Negative for decreased urine volume, difficulty urinating, dysuria, hematuria and urgency.  Musculoskeletal: Positive for back pain.  Skin: Negative for rash.  Hematological: Negative.   Psychiatric/Behavioral: Negative.      Physical Exam Triage Vital Signs ED Triage Vitals  Enc Vitals Group     BP 10/15/20 1009 (!) 174/95     Pulse Rate 10/15/20 1007 63     Resp 10/15/20 1007 18     Temp 10/15/20 1007 98.2 F (36.8 C)     Temp Source 10/15/20 1007 Oral     SpO2 10/15/20 1007 100 %      Weight 10/15/20 1007 220 lb (99.8 kg)     Height 10/15/20 1007 6\' 1"  (1.854 m)     Head Circumference --      Peak Flow --      Pain Score 10/15/20 1006 4     Pain Loc --      Pain Edu? --      Excl. in [place]? --    No data found.  Updated Vital Signs BP (!) 174/95   Pulse 63   Temp 98.2 F (36.8 C) (Oral)   Resp 18   Ht 6\' 1"  (1.854  m)   Wt 220 lb (99.8 kg)   SpO2 100%   BMI 29.03 kg/m   Visual Acuity Right Eye Distance:   Left Eye Distance:   Bilateral Distance:    Right Eye Near:   Left Eye Near:    Bilateral Near:     Physical Exam Vitals and nursing note reviewed.  Constitutional:      General: He is not in acute distress.    Appearance: He is well-developed and normal weight. He is not ill-appearing.  HENT:     Head: Normocephalic and atraumatic.  Cardiovascular:     Rate and Rhythm: Normal rate and regular rhythm.     Pulses: Normal pulses.     Heart sounds: Normal heart sounds. No murmur heard. No gallop.   Pulmonary:     Effort: Pulmonary effort is normal.     Breath sounds: Normal breath sounds. No wheezing, rhonchi or rales.  Abdominal:     General: Abdomen is flat. There is no distension.     Palpations: Abdomen is soft.     Tenderness: There is abdominal tenderness. There is no right CVA tenderness, left CVA tenderness, guarding or rebound.  Skin:    General: Skin is warm and dry.     Capillary Refill: Capillary refill takes less than 2 seconds.     Findings: No erythema or rash.  Neurological:     General: No focal deficit present.     Mental Status: He is alert and oriented to person, place, and time.  Psychiatric:        Mood and Affect: Mood normal.        Behavior: Behavior normal.        Thought Content: Thought content normal.        Judgment: Judgment normal.      UC Treatments / Results  Labs (all labs ordered are listed, but only abnormal results are displayed) Labs Reviewed  URINALYSIS, COMPLETE (UACMP) WITH MICROSCOPIC -  Abnormal; Notable for the following components:      Result Value   Hgb urine dipstick TRACE (*)    All other components within normal limits  BASIC METABOLIC PANEL - Abnormal; Notable for the following components:   Sodium 133 (*)    Potassium 3.2 (*)    Chloride 97 (*)    Glucose, Bld 177 (*)    All other components within normal limits  CBC WITH DIFFERENTIAL/PLATELET    EKG   Radiology DG Abdomen Acute W/Chest  Result Date: 10/15/2020 CLINICAL DATA:  Left flank and left lower quadrant abdominal pain beginning 2 days ago. EXAM: DG ABDOMEN ACUTE WITH 1 VIEW CHEST COMPARISON:  None. FINDINGS: Bowel gas pattern does not show evidence of ileus or obstruction. Amount of fecal matter is within normal limits. No sign of urinary tract stone disease. Ordinary degenerative changes affect the lumbar spine. Evidence of soft tissue mass lesion. Question single gallstone in the right upper quadrant. One-view chest shows normal heart size and mediastinal shadows. The lungs are clear. No free air. No effusions. No significant bone finding. IMPRESSION: No abnormality seen to explain the clinical presentation. Bowel gas pattern is within normal limits. Amount of fecal matter is within normal limits. The patient may have 1 visible gallstone. Electronically Signed   By: Nelson Chimes M.D.   On: 10/15/2020 11:18    Procedures Procedures (including critical care time)  Medications Ordered in UC Medications - No data to display  Initial Impression / Assessment and Plan /  UC Course  I have reviewed the triage vital signs and the nursing notes.  Pertinent labs & imaging results that were available during my care of the patient were reviewed by me and considered in my medical decision making (see chart for details).   Patient is a very pleasant and nontoxic-appearing 75 year old male here for evaluation of left flank and left lower quadrant abdominal pain that is been going on for the past 2 days with no  associated symptoms.  Patient states he may have a little increased urinary frequency but he is unsure.  The pain increases with certain movement and if he lays down and improves with standing.  Physical exam reveals a benign cardiopulmonary exam.  Patient has no CVA tenderness to percussion on either side.  Patient does have very mild tenderness in the left lateral superior lumbar paraspinous region without overt spasm.  Patient's abdomen is protuberant but soft with positive bowel sounds in all 4 quadrants.  Patient has very minimal left lower quadrant abdominal pain without guarding or rebound.  Will check CBC, BMP, UA and abdominal film to look for constipation or renal calculus.  Patient has no history of renal calculi.  Radiology trepidation of three-way abdomen is negative.  CBC is unremarkable.  BMP shows mild hyponatremia with a sodium of 133, hypokalemia with a potassium of 3.2, and elevated glucose of 177.  Urinalysis shows trace hemoglobin but is otherwise unremarkable.  Suspect patient's pain is musculoskeletal though the left lower quadrant pain is concerning.  We will treat patient for musculoskeletal back pain and give return precautions.  Patient's last colonoscopy on 04/04/2018 did not show any evidence of diverticulitis.   Final Clinical Impressions(s) / UC Diagnoses   Final diagnoses:  Acute left-sided low back pain without sciatica     Discharge Instructions     Take the baclofen 3 times a day to help with muscle spasms in your low back.  This is safe to continue taking with your meloxicam.  If you develop a fever, increasing abdominal pain, noticed any blood in your stool, or develop nausea and vomiting return for reevaluation or go to the ER.    ED Prescriptions    Medication Sig Dispense Auth. Provider   baclofen (LIORESAL) 10 MG tablet Take 1 tablet (10 mg total) by mouth 3 (three) times daily. 14 each Margarette Canada, NP     PDMP not reviewed this encounter.    Margarette Canada, NP 10/15/20 1144

## 2020-10-15 NOTE — Discharge Instructions (Addendum)
Take the baclofen 3 times a day to help with muscle spasms in your low back.  This is safe to continue taking with your meloxicam.  If you develop a fever, increasing abdominal pain, noticed any blood in your stool, or develop nausea and vomiting return for reevaluation or go to the ER.

## 2020-10-15 NOTE — ED Triage Notes (Signed)
Patient c/o left side upper abdominal pain and back pain that started on Tuesday. Denies any nausea, vomiting, diarrhea or urinary symptoms.

## 2020-10-21 ENCOUNTER — Other Ambulatory Visit: Payer: Self-pay

## 2020-10-21 ENCOUNTER — Ambulatory Visit (INDEPENDENT_AMBULATORY_CARE_PROVIDER_SITE_OTHER)
Admission: RE | Admit: 2020-10-21 | Discharge: 2020-10-21 | Disposition: A | Discharge status: Home / Self Care | Payer: Medicare Other | Source: Ambulatory Visit | Attending: Family Medicine | Admitting: Family Medicine

## 2020-10-21 ENCOUNTER — Ambulatory Visit
Admission: RE | Admit: 2020-10-21 | Discharge: 2020-10-21 | Disposition: A | Discharge status: Home / Self Care | Payer: Medicare Other | Source: Ambulatory Visit | Attending: Family Medicine | Admitting: Family Medicine

## 2020-10-21 VITALS — BP 131/90 | HR 68 | Temp 98.3°F | Resp 18 | Ht 73.0 in | Wt 220.0 lb

## 2020-10-21 DIAGNOSIS — K8080 Other cholelithiasis without obstruction: Secondary | ICD-10-CM | POA: Diagnosis not present

## 2020-10-21 DIAGNOSIS — R1012 Left upper quadrant pain: Secondary | ICD-10-CM

## 2020-10-21 DIAGNOSIS — N400 Enlarged prostate without lower urinary tract symptoms: Secondary | ICD-10-CM | POA: Diagnosis not present

## 2020-10-21 DIAGNOSIS — N281 Cyst of kidney, acquired: Secondary | ICD-10-CM | POA: Diagnosis not present

## 2020-10-21 DIAGNOSIS — R10A0 Flank pain, unspecified side: Secondary | ICD-10-CM

## 2020-10-21 DIAGNOSIS — K8020 Calculus of gallbladder without cholecystitis without obstruction: Secondary | ICD-10-CM | POA: Diagnosis not present

## 2020-10-21 DIAGNOSIS — R109 Unspecified abdominal pain: Secondary | ICD-10-CM

## 2020-10-21 MED ORDER — HYDROCODONE-ACETAMINOPHEN 5-325 MG PO TABS: 1.0000 | ORAL_TABLET | Freq: Three times a day (TID) | ORAL | 0 refills | Status: DC | PRN

## 2020-10-21 NOTE — ED Triage Notes (Signed)
Patient c/o left side upper abdominal pain and back pain that started 1 week ago. Patient states he was seen on 05/26 for the same symptoms. Denies nausea, vomiting, or diarrhea. Denies urinary symptoms.

## 2020-10-21 NOTE — ED Provider Notes (Signed)
[facility]    CSN: [id number] Arrival date & time: 10/21/20  1448      History   Chief Complaint Chief Complaint  Patient presents with  . Abdominal Pain  . Back Pain   HPI  75 year old male presents with the above complaints.   Patient reporting left-sided abdominal pain/flank pain which started over a week ago.  He was recently seen and had laboratory studies and a KUB done.  Work-up was unremarkable.  Also had a urinalysis which was unremarkable.  This was thought to be musculoskeletal.  He was treated with a muscle relaxer.  He states that he has had no improvement.  Continues to have the pain.  Worse at night.  Denies urinary symptoms.  Denies GI symptoms.  No relieving factors.  Past Medical History:  Diagnosis Date  . Diabetes mellitus without complication ([place])    type 2  . Gout   . History of chicken pox   . History of measles   . History of mumps   . Hyperlipidemia   . Hypertension     Patient Active Problem List   Diagnosis Date Noted  . Chronic left shoulder pain 04/13/2020  . Frequent PVCs 04/03/2018  . Hypokalemia 07/18/2016  . Cochlear implant in place 06/28/2016  . GERD (gastroesophageal reflux disease) 02/26/2015  . Hearing loss 02/26/2015  . HLD (hyperlipidemia) 02/26/2015  . Benign prostatic hyperplasia with urinary obstruction 09/04/2012  . Chronic prostatitis 06/04/2012  . Allergic rhinitis 02/08/2009  . Diverticulosis of colon without hemorrhage 02/26/2008  . Diabetes mellitus ([place]) 02/04/2008  . Gout 02/01/2007  . Essential (primary) hypertension 02/01/2007  . Irritable bowel syndrome 05/23/1998    Past Surgical History:  Procedure Laterality Date  . COCHLEAR IMPLANT Right 06/28/2016   Vira Blanco, MD [facility]  . COLONOSCOPY  2009  . COLONOSCOPY WITH PROPOFOL N/A 04/04/2018   Procedure: COLONOSCOPY WITH PROPOFOL;  Surgeon: Robert Bellow, MD;  Location: [facility] ENDOSCOPY;  Service: Endoscopy;  Laterality: N/A;  . EXTERNAL  EAR SURGERY  2018  . TONSILLECTOMY    . UPPER GI ENDOSCOPY  2009  . UPPER GI ENDOSCOPY  03/04/2011   [facility]; Excision of multiple gastric polyps       Home Medications    Prior to Admission medications   Medication Sig Start Date End Date Taking? Authorizing Provider  ACCU-CHEK AVIVA PLUS test strip USE ONCE EVERY DAY AS DIRECTED 07/25/20  Yes Birdie Sons, MD  amLODipine (NORVASC) 10 MG tablet TAKE 1 TABLET EVERY DAY 11/28/19  Yes Birdie Sons, MD  aspirin 81 MG tablet Take 1 tablet by mouth daily. 11/10/11  Yes [provider]  atenolol (TENORMIN) 50 MG tablet TAKE 1 TABLET EVERY DAY 09/18/20  Yes Birdie Sons, MD  azelastine (ASTELIN) 0.1 % nasal spray Place 2 sprays into both nostrils 2 (two) times daily. Use in each nostril as directed 04/13/20  Yes Fisher, Kirstie Peri, MD  baclofen (LIORESAL) 10 MG tablet Take 1 tablet (10 mg total) by mouth 3 (three) times daily. 10/15/20  Yes Margarette Canada, NP  finasteride (PROSCAR) 5 MG tablet Take 1 tablet (5 mg total) by mouth daily. 12/02/19  Yes Birdie Sons, MD  glucosamine-chondroitin 500-400 MG tablet Take 1 tablet by mouth daily. 02/07/07  Yes [provider]  HYDROcodone-acetaminophen (NORCO/VICODIN) 5-325 MG tablet Take 1 tablet by mouth every 8 (eight) hours as needed for moderate pain or severe pain. 10/21/20  Yes Coral Spikes, DO  indomethacin (INDOCIN) 25 MG capsule Take 1 capsule (25 mg total) by mouth 3 (three) times daily with meals as needed. Up to 3 times a day 01/08/18  Yes Birdie Sons, MD  losartan-hydrochlorothiazide [facility]) 100-25 MG tablet TAKE 1 TABLET EVERY DAY 12/13/19  Yes Birdie Sons, MD  meloxicam (MOBIC) 15 MG tablet Take 1 tablet (15 mg total) by mouth daily as needed for pain. As needed 04/13/20  Yes Fisher, Kirstie Peri, MD  Misc Natural Products (BLACK CHERRY CONCENTRATE PO) Take 1 tablet by mouth 2 (two) times daily.    Yes [provider]  montelukast (SINGULAIR) 10 MG tablet  TAKE 1 TABLET EVERY DAY 03/21/19  Yes Birdie Sons, MD  Multiple Vitamins-Minerals (MULTIVITAMIN ADULT PO) Take 1 tablet by mouth daily. 09/25/08  Yes [provider]  Omega-3 Fatty Acids (FISH OIL) 1000 MG CPDR Take 1 capsule by mouth daily. 11/10/11  Yes [provider]  omeprazole (PRILOSEC) 20 MG capsule TAKE 1 CAPSULE EVERY DAY 09/18/20  Yes Birdie Sons, MD  potassium chloride SA (KLOR-CON) 20 MEQ tablet TAKE 1 TABLET EVERY DAY 06/02/20  Yes Birdie Sons, MD  pravastatin (PRAVACHOL) 20 MG tablet TAKE 1 TABLET EVERY DAY 08/31/20  Yes Birdie Sons, MD  tamsulosin (FLOMAX) 0.4 MG CAPS capsule TAKE 1 CAPSULE (0.4 MG TOTAL) BY MOUTH DAILY. 09/17/20  Yes Birdie Sons, MD  Triamcinolone Acetonide (NASACORT AQ NA) Place into the nose daily.   Yes [provider]    Family History Family History  Problem Relation Age of Onset  . Hypertension Mother   . Hypertension Sister     Social History Social History   Tobacco Use  . Smoking status: Never Smoker  . Smokeless tobacco: Former Systems developer    Types: Snuff, Chew  Vaping Use  . Vaping Use: Never used  Substance Use Topics  . Alcohol use: No    Alcohol/week: 0.0 standard drinks  . Drug use: No     Allergies   Patient has no known allergies.   Review of Systems Review of Systems  Constitutional: Negative.   Gastrointestinal: Positive for abdominal pain.  Genitourinary: Positive for flank pain.   Physical Exam Triage Vital Signs ED Triage Vitals  Enc Vitals Group     BP 10/21/20 1506 131/90     Pulse Rate 10/21/20 1506 68     Resp 10/21/20 1506 18     Temp 10/21/20 1506 98.3 F (36.8 C)     Temp Source 10/21/20 1506 Oral     SpO2 10/21/20 1506 98 %     Weight 10/21/20 1505 220 lb 0.3 oz (99.8 kg)     Height 10/21/20 1505 6\' 1"  (1.854 m)     Head Circumference --      Peak Flow --      Pain Score 10/21/20 1505 3     Pain Loc --      Pain Edu? --      Excl. in [place]? --    Updated  Vital Signs BP 131/90 (BP Location: Left Arm)   Pulse 68   Temp 98.3 F (36.8 C) (Oral)   Resp 18   Ht 6\' 1"  (1.854 m)   Wt 99.8 kg   SpO2 98%   BMI 29.03 kg/m   Visual Acuity Right Eye Distance:   Left Eye Distance:   Bilateral Distance:    Right Eye Near:   Left Eye Near:    Bilateral Near:  Physical Exam Vitals and nursing note reviewed.  Constitutional:      General: He is not in acute distress.    Appearance: He is well-developed. He is not ill-appearing.  HENT:     Head: Normocephalic and atraumatic.  Eyes:     General:        Right eye: No discharge.        Left eye: No discharge.     Conjunctiva/sclera: Conjunctivae normal.  Cardiovascular:     Rate and Rhythm: Normal rate and regular rhythm.  Pulmonary:     Effort: Pulmonary effort is normal.     Breath sounds: Normal breath sounds. No wheezing, rhonchi or rales.  Abdominal:     General: There is no distension.     Palpations: Abdomen is soft.     Comments: Mild tenderness in the LLQ.   Neurological:     Mental Status: He is alert.  Psychiatric:        Mood and Affect: Mood normal.        Behavior: Behavior normal.    UC Treatments / Results  Labs (all labs ordered are listed, but only abnormal results are displayed) Labs Reviewed - No data to display  EKG   Radiology CT ABDOMEN PELVIS WO CONTRAST  Result Date: 10/21/2020 CLINICAL DATA:  Acute left upper quadrant abdominal pain. EXAM: CT ABDOMEN AND PELVIS WITHOUT CONTRAST TECHNIQUE: Multidetector CT imaging of the abdomen and pelvis was performed following the standard protocol without IV contrast. COMPARISON:  None. FINDINGS: Lower chest: 5 mm nodule is noted in right middle lobe. Hepatobiliary: Small gallstone is noted. No biliary dilatation is noted. The liver is unremarkable. Pancreas: Unremarkable. No pancreatic ductal dilatation or surrounding inflammatory changes. Spleen: Normal in size without focal abnormality. Adrenals/Urinary Tract:  Adrenal glands appear normal. Bilateral renal cysts are noted, with the largest measuring 7.9 cm on the left. No hydronephrosis or renal obstruction is noted. No renal or ureteral calculi are noted. Urinary bladder is unremarkable. Stomach/Bowel: Stomach is within normal limits. Appendix appears normal. No evidence of bowel wall thickening, distention, or inflammatory changes. Vascular/Lymphatic: Aortic atherosclerosis. No enlarged abdominal or pelvic lymph nodes. Reproductive: Mild prostatic enlargement is noted. Other: No abdominal wall hernia or abnormality. No abdominopelvic ascites. Musculoskeletal: No acute or significant osseous findings. IMPRESSION: 5 mm nodule seen in right middle lobe. No follow-up needed if patient is low-risk. Non-contrast chest CT can be considered in 12 months if patient is high-risk. This recommendation follows the consensus statement: Guidelines for Management of Incidental Pulmonary Nodules Detected on CT Images: From the Fleischner Society 2017; Radiology 2017; 284:228-243. Small gallstone. Mild prostatic enlargement. No acute abnormality seen in the abdomen or pelvis. Aortic Atherosclerosis (ICD10-I70.0). Electronically Signed   By: Marijo Conception M.D.   On: 10/21/2020 16:03    Procedures Procedures (including critical care time)  Medications Ordered in UC Medications - No data to display  Initial Impression / Assessment and Plan / UC Course  I have reviewed the triage vital signs and the nursing notes.  Pertinent labs & imaging results that were available during my care of the patient were reviewed by me and considered in my medical decision making (see chart for details).    75 year old male presents with persistent left-sided flank pain/abdominal pain.  He has had a previous work-up here.  Today CT scan was obtained and revealed a large renal cyst on the left side.  No other significant abnormalities that would be resulting in his pain.  I have placed a referral  to urology so that this can be discussed.  Vicodin as needed for pain.  Supportive care.  Final Clinical Impressions(s) / UC Diagnoses   Final diagnoses:  Flank pain  Renal cyst     Discharge Instructions     Medication as needed for pain.  I have placed a referral to urology.  Take care  Dr. Lacinda Axon    ED Prescriptions    Medication Sig Dispense Auth. Provider   HYDROcodone-acetaminophen (NORCO/VICODIN) 5-325 MG tablet Take 1 tablet by mouth every 8 (eight) hours as needed for moderate pain or severe pain. 15 tablet Thersa Salt G, DO     I have reviewed the PDMP during this encounter.   Coral Spikes, [place] 10/21/20 1728

## 2020-10-21 NOTE — Discharge Instructions (Signed)
Medication as needed for pain.  I have placed a referral to urology.  Take care  Dr. Lacinda Axon

## 2020-10-22 ENCOUNTER — Encounter: Payer: Self-pay | Admitting: Urology

## 2020-10-22 ENCOUNTER — Ambulatory Visit (INDEPENDENT_AMBULATORY_CARE_PROVIDER_SITE_OTHER): Payer: Medicare Other | Admitting: Urology

## 2020-10-22 VITALS — BP 121/62 | HR 64 | Ht 73.0 in | Wt 220.0 lb

## 2020-10-22 DIAGNOSIS — Z125 Encounter for screening for malignant neoplasm of prostate: Secondary | ICD-10-CM

## 2020-10-22 DIAGNOSIS — N401 Enlarged prostate with lower urinary tract symptoms: Secondary | ICD-10-CM

## 2020-10-22 DIAGNOSIS — N281 Cyst of kidney, acquired: Secondary | ICD-10-CM | POA: Diagnosis not present

## 2020-10-22 DIAGNOSIS — N138 Other obstructive and reflux uropathy: Secondary | ICD-10-CM

## 2020-10-22 NOTE — Progress Notes (Signed)
10/22/20 3:03 PM   Adrian Thompson [birth year] [id number]  CC: Left flank pain, BPH, PSA screening  HPI: I saw Adrian Thompson and his wife in urology clinic for the above issues.  He is a 75 year old male who was previously followed by [name] urology for BPH and mildly elevated PSA.  He has been on Flomax and finasteride long-term and denies any urinary symptoms, and PSA has been stable.  Most recent PSA was normal at 1.8(corrected for finasteride 3.6).  His primary issue today is left-sided flank pain that started 1 week ago.  This started acutely and he does not report any trauma or fall.  He never had pain like this before.  It seems to be worse at night.  It does not seem to be worse with physical activity.  He has been taking meloxicam and Norco as needed.  He is not having any hematuria or urinary symptoms.  He was seen twice in urgent care over the last few days and ultimately a CT without contrast was performed on 10/21/2020 and showed no hydronephrosis or stones, and there was a 7 cm left renal cyst.  There is no prior imaging to evaluate the duration of this simple appearing cyst.  Urinalysis today is completely benign.   PMH: Past Medical History:  Diagnosis Date  . Diabetes mellitus without complication ([place])    type 2  . Gout   . History of chicken pox   . History of measles   . History of mumps   . Hyperlipidemia   . Hypertension     Surgical History: Past Surgical History:  Procedure Laterality Date  . COCHLEAR IMPLANT Right 06/28/2016   Vira Blanco, MD [facility]  . COLONOSCOPY  2009  . COLONOSCOPY WITH PROPOFOL N/A 04/04/2018   Procedure: COLONOSCOPY WITH PROPOFOL;  Surgeon: Robert Bellow, MD;  Location: [facility] ENDOSCOPY;  Service: Endoscopy;  Laterality: N/A;  . EXTERNAL EAR SURGERY  2018  . TONSILLECTOMY    . UPPER GI ENDOSCOPY  2009  . UPPER GI ENDOSCOPY  03/04/2011   [facility]; Excision of multiple gastric polyps    Family History: Family History  Problem Relation  Age of Onset  . Hypertension Mother   . Hypertension Sister     Social History:  reports that he has never smoked. He has quit using smokeless tobacco.  His smokeless tobacco use included snuff and chew. He reports that he does not drink alcohol and does not use drugs.  Physical Exam: BP 121/62   Pulse 64   Ht 6\' 1"  (1.854 m)   Wt 220 lb (99.8 kg)   BMI 29.03 kg/m    Constitutional:  Alert and oriented, No acute distress. Cardiovascular: No clubbing, cyanosis, or edema. Respiratory: Normal respiratory effort, no increased work of breathing. GI: Abdomen is soft, nontender, nondistended, no abdominal masses No skin lesions on the abdomen or back, no CVA tenderness  Laboratory Data: Reviewed, see HPI  Pertinent Imaging: I have personally viewed and interpreted the CT dated 10/21/2020, see HPI.  Assessment & Plan:   75 year old male with left-sided flank pain for 1 week of unclear etiology.  We discussed possible etiologies including musculoskeletal, idiopathic, and the left renal cyst.  We discussed that pain from a simple renal cyst is rare, but not impossible.  Unfortunately there is no prior imaging to evaluate the duration of the cyst.  I recommended a week of NSAIDs and rest and see if this improves, but he could consider seeing interventional  radiology in the future if he has no improvement of his pain to see if they feel aspiration would be warranted.  Regarding BPH, I am comfortable with his PCP filling his Flomax and finasteride that he has been on long-term.  He does not have any urinary symptoms at this time.  We reviewed the AUA guidelines regarding PSA screening, and that routine screening in men over age 27 is not recommended, and with his most recent normal PSA from November 2021 I do not think he needs to continue yearly screening.  Reassurance provided, referral placed to interventional radiology.  I recommended he wait at least 1 to 2 weeks before seeing them to see if  his flank pain resolves spontaneously prior to considering any renal cyst drainage/aspiration.   Nickolas Madrid, MD 10/22/2020  [facility] [address] [place], [place] [address] [phone]

## 2020-10-22 NOTE — Patient Instructions (Signed)
Acute Back Pain, Adult Acute back pain is sudden and usually short-lived. It is often caused by an injury to the muscles and tissues in the back. The injury may result from:  A muscle or ligament getting overstretched or torn (strained). Ligaments are tissues that connect bones to each other. Lifting something improperly can cause a back strain.  Wear and tear (degeneration) of the spinal disks. Spinal disks are circular tissue that provide cushioning between the bones of the spine (vertebrae).  Twisting motions, such as while playing sports or doing yard work.  A hit to the back.  Arthritis. You may have a physical exam, lab tests, and imaging tests to find the cause of your pain. Acute back pain usually goes away with rest and home care. Follow these instructions at home: Managing pain, stiffness, and swelling  Treatment may include medicines for pain and inflammation that are taken by mouth or applied to the skin, prescription pain medicine, or muscle relaxants. Take over-the-counter and prescription medicines only as told by your health care provider.  Your health care provider may recommend applying ice during the first 24-48 hours after your pain starts. To do this: ? Put ice in a plastic bag. ? Place a towel between your skin and the bag. ? Leave the ice on for 20 minutes, 2-3 times a day.  If directed, apply heat to the affected area as often as told by your health care provider. Use the heat source that your health care provider recommends, such as a moist heat pack or a heating pad. ? Place a towel between your skin and the heat source. ? Leave the heat on for 20-30 minutes. ? Remove the heat if your skin turns bright red. This is especially important if you are unable to feel pain, heat, or cold. You have a greater risk of getting burned. Activity  Do not stay in bed. Staying in bed for more than 1-2 days can delay your recovery.  Sit up and stand up straight. Avoid leaning  forward when you sit or hunching over when you stand. ? If you work at a desk, sit close to it so you do not need to lean over. Keep your chin tucked in. Keep your neck drawn back, and keep your elbows bent at a 90-degree angle (right angle). ? Sit high and close to the steering wheel when you drive. Add lower back (lumbar) support to your car seat, if needed.  Take short walks on even surfaces as soon as you are able. Try to increase the length of time you walk each day.  Do not sit, drive, or stand in one place for more than 30 minutes at a time. Sitting or standing for long periods of time can put stress on your back.  Do not drive or use heavy machinery while taking prescription pain medicine.  Use proper lifting techniques. When you bend and lift, use positions that put less stress on your back: ? Bend your knees. ? Keep the load close to your body. ? Avoid twisting.  Exercise regularly as told by your health care provider. Exercising helps your back heal faster and helps prevent back injuries by keeping muscles strong and flexible.  Work with a physical therapist to make a safe exercise program, as recommended by your health care provider. Do any exercises as told by your physical therapist.   Lifestyle  Maintain a healthy weight. Extra weight puts stress on your back and makes it difficult to have   good posture.  Avoid activities or situations that make you feel anxious or stressed. Stress and anxiety increase muscle tension and can make back pain worse. Learn ways to manage anxiety and stress, such as through exercise. General instructions  Sleep on a firm mattress in a comfortable position. Try lying on your side with your knees slightly bent. If you lie on your back, put a pillow under your knees.  Follow your treatment plan as told by your health care provider. This may include: ? Cognitive or behavioral therapy. ? Acupuncture or massage therapy. ? Meditation or yoga. Contact  a health care provider if:  You have pain that is not relieved with rest or medicine.  You have increasing pain going down into your legs or buttocks.  Your pain does not improve after 2 weeks.  You have pain at night.  You lose weight without trying.  You have a fever or chills. Get help right away if:  You develop new bowel or bladder control problems.  You have unusual weakness or numbness in your arms or legs.  You develop nausea or vomiting.  You develop abdominal pain.  You feel faint. Summary  Acute back pain is sudden and usually short-lived.  Use proper lifting techniques. When you bend and lift, use positions that put less stress on your back.  Take over-the-counter and prescription medicines and apply heat or ice as directed by your health care provider. This information is not intended to replace advice given to you by your health care provider. Make sure you discuss any questions you have with your health care provider. Document Revised: 01/31/2020 Document Reviewed: 01/31/2020 Elsevier Patient Education  2021 Elsevier Inc.  

## 2020-10-23 LAB — URINALYSIS, COMPLETE
Bilirubin, UA: NEGATIVE
Glucose, UA: NEGATIVE
Ketones, UA: NEGATIVE
Leukocytes,UA: NEGATIVE
Nitrite, UA: NEGATIVE
Protein,UA: NEGATIVE
RBC, UA: NEGATIVE
Specific Gravity, UA: 1.025 (ref 1.005–1.030)
Urobilinogen, Ur: 0.2 mg/dL (ref 0.2–1.0)
pH, UA: 5 (ref 5.0–7.5)

## 2020-10-23 LAB — MICROSCOPIC EXAMINATION
Bacteria, UA: NONE SEEN
RBC, Urine: NONE SEEN /hpf (ref 0–2)

## 2020-10-29 ENCOUNTER — Telehealth: Payer: Self-pay

## 2020-10-29 NOTE — Telephone Encounter (Signed)
Message sent to scheduling, awaiting response

## 2020-10-29 NOTE — Telephone Encounter (Addendum)
-----   Message from Billey Co, MD sent at 10/22/2020  2:12 PM EDT ----- I placed a referral to interventional radiology to consider drainage of his left renal cyst that may be causing flank pain.  This should be set up to talk with them in 2 to 3 weeks  Nickolas Madrid, MD 10/22/2020

## 2020-10-30 ENCOUNTER — Other Ambulatory Visit: Payer: Self-pay | Admitting: Urology

## 2020-10-30 DIAGNOSIS — N281 Cyst of kidney, acquired: Secondary | ICD-10-CM

## 2020-11-06 NOTE — Telephone Encounter (Signed)
Consult scheduled on 11/11/20- patient previously notified by scheduling

## 2020-11-11 ENCOUNTER — Other Ambulatory Visit (HOSPITAL_COMMUNITY): Payer: Self-pay | Admitting: Interventional Radiology

## 2020-11-11 ENCOUNTER — Encounter: Payer: Self-pay | Admitting: *Deleted

## 2020-11-11 ENCOUNTER — Other Ambulatory Visit: Payer: Self-pay

## 2020-11-11 ENCOUNTER — Ambulatory Visit
Admission: RE | Admit: 2020-11-11 | Discharge: 2020-11-11 | Disposition: A | Discharge status: Home / Self Care | Payer: Medicare Other | Source: Ambulatory Visit | Attending: Urology | Admitting: Urology

## 2020-11-11 DIAGNOSIS — N281 Cyst of kidney, acquired: Secondary | ICD-10-CM

## 2020-11-11 DIAGNOSIS — N2889 Other specified disorders of kidney and ureter: Secondary | ICD-10-CM

## 2020-11-11 HISTORY — PX: IR RADIOLOGIST EVAL & MGMT: IMG5224

## 2020-11-11 NOTE — Consult Note (Signed)
Chief Complaint: Patient was consulted remotely today (TeleHealth) for left renal cystic lesion at the request of Sninsky,Brian C.    Referring Physician(s): Sninsky,Brian C  History of Present Illness: Adrian Thompson is a 75 y.o. male With a history of BPH who presented to urology for routine follow-up, and was complaining of left-sided flank pain.  This started without evident inciting event, trauma or fall.  He has not had this sort of pain before.  He describes it is somewhat migratory.  He does however state that since his visit with urology 3 weeks ago the symptoms have largely abated.  No hematuria. Past Medical History:  Diagnosis Date   Diabetes mellitus without complication ([place])    type 2   Gout    History of chicken pox    History of measles    History of mumps    Hyperlipidemia    Hypertension     Past Surgical History:  Procedure Laterality Date   COCHLEAR IMPLANT Right 06/28/2016   Vira Blanco, MD [facility]   COLONOSCOPY  2009   COLONOSCOPY WITH PROPOFOL N/A 04/04/2018   Procedure: COLONOSCOPY WITH PROPOFOL;  Surgeon: Robert Bellow, MD;  Location: [facility] ENDOSCOPY;  Service: Endoscopy;  Laterality: N/A;   EXTERNAL EAR SURGERY  2018   TONSILLECTOMY     UPPER GI ENDOSCOPY  2009   UPPER GI ENDOSCOPY  03/04/2011   [facility]; Excision of multiple gastric polyps    Allergies: Patient has no known allergies.  Medications: Prior to Admission medications   Medication Sig Start Date End Date Taking? Authorizing Provider  ACCU-CHEK AVIVA PLUS test strip USE ONCE EVERY DAY AS DIRECTED 07/25/20   Birdie Sons, MD  amLODipine (NORVASC) 10 MG tablet TAKE 1 TABLET EVERY DAY 11/28/19   Birdie Sons, MD  aspirin 81 MG tablet Take 1 tablet by mouth daily. 11/10/11   [provider]  atenolol (TENORMIN) 50 MG tablet TAKE 1 TABLET EVERY DAY 09/18/20   Birdie Sons, MD  azelastine (ASTELIN) 0.1 % nasal spray Place 2 sprays into both nostrils 2 (two) times  daily. Use in each nostril as directed 04/13/20   Birdie Sons, MD  finasteride (PROSCAR) 5 MG tablet Take 1 tablet (5 mg total) by mouth daily. 12/02/19   Birdie Sons, MD  glucosamine-chondroitin 500-400 MG tablet Take 1 tablet by mouth daily. 02/07/07   [provider]  HYDROcodone-acetaminophen (NORCO/VICODIN) 5-325 MG tablet Take 1 tablet by mouth every 8 (eight) hours as needed for moderate pain or severe pain. 10/21/20   Coral Spikes, DO  indomethacin (INDOCIN) 25 MG capsule Take 1 capsule (25 mg total) by mouth 3 (three) times daily with meals as needed. Up to 3 times a day 01/08/18   Birdie Sons, MD  losartan-hydrochlorothiazide [facility]) 100-25 MG tablet TAKE 1 TABLET EVERY DAY 12/13/19   Birdie Sons, MD  meloxicam (MOBIC) 15 MG tablet Take 1 tablet (15 mg total) by mouth daily as needed for pain. As needed 04/13/20   Birdie Sons, MD  Misc Natural Products (BLACK CHERRY CONCENTRATE PO) Take 1 tablet by mouth 2 (two) times daily.     [provider]  montelukast (SINGULAIR) 10 MG tablet TAKE 1 TABLET EVERY DAY 03/21/19   Birdie Sons, MD  Multiple Vitamins-Minerals (MULTIVITAMIN ADULT PO) Take 1 tablet by mouth daily. 09/25/08   [provider]  Omega-3 Fatty Acids (FISH OIL) 1000 MG CPDR Take 1 capsule by  mouth daily. 11/10/11   [provider]  omeprazole (PRILOSEC) 20 MG capsule TAKE 1 CAPSULE EVERY DAY 09/18/20   Birdie Sons, MD  potassium chloride SA (KLOR-CON) 20 MEQ tablet TAKE 1 TABLET EVERY DAY 06/02/20   Birdie Sons, MD  pravastatin (PRAVACHOL) 20 MG tablet TAKE 1 TABLET EVERY DAY 08/31/20   Birdie Sons, MD  tamsulosin (FLOMAX) 0.4 MG CAPS capsule TAKE 1 CAPSULE (0.4 MG TOTAL) BY MOUTH DAILY. 09/17/20   Birdie Sons, MD  Triamcinolone Acetonide (NASACORT AQ NA) Place into the nose daily.    [provider]     Family History  Problem Relation Age of Onset   Hypertension Mother    Hypertension Sister      Social History   Socioeconomic History   Marital status: Married    Spouse name: Not on file   Number of children: 0   Years of education: Not on file   Highest education level: Associate degree: occupational, Hotel manager, or vocational program  Occupational History   Occupation: Retired  Tobacco Use   Smoking status: Never   Smokeless tobacco: Former    Types: Snuff, Database administrator Use   Vaping Use: Never used  Substance and Sexual Activity   Alcohol use: No    Alcohol/week: 0.0 standard drinks   Drug use: No   Sexual activity: Not on file  Other Topics Concern   Not on file  Social History Narrative   Not on file   Social Determinants of Health   Financial Resource Strain: Low Risk    Difficulty of Paying Living Expenses: Not hard at all  Food Insecurity: No Food Insecurity   Worried About Charity fundraiser in the Last Year: Never true   [place] in the Last Year: Never true  Transportation Needs: No Transportation Needs   Lack of Transportation (Medical): No   Lack of Transportation (Non-Medical): No  Physical Activity: Inactive   Days of Exercise per Week: 0 days   Minutes of Exercise per Session: 0 min  Stress: No Stress Concern Present   Feeling of Stress : Not at all  Social Connections: Moderately Isolated   Frequency of Communication with Friends and Family: Three times a week   Frequency of Social Gatherings with Friends and Family: Three times a week   Attends Religious Services: Never   Active Member of Clubs or Organizations: No   Attends Archivist Meetings: Never   Marital Status: Married    ECOG Status: 1 - Symptomatic but completely ambulatory  Review of Systems  Review of Systems: A 12 point ROS discussed and pertinent positives are indicated in the HPI above.  All other systems are negative.  Physical Exam No direct physical exam was performed (except for noted visual exam findings with Video Visits).     Vital  Signs: There were no vitals taken for this visit.  Imaging: CT ABDOMEN PELVIS WO CONTRAST  Result Date: 10/21/2020 CLINICAL DATA:  Acute left upper quadrant abdominal pain. EXAM: CT ABDOMEN AND PELVIS WITHOUT CONTRAST TECHNIQUE: Multidetector CT imaging of the abdomen and pelvis was performed following the standard protocol without IV contrast. COMPARISON:  None. FINDINGS: Lower chest: 5 mm nodule is noted in right middle lobe. Hepatobiliary: Small gallstone is noted. No biliary dilatation is noted. The liver is unremarkable. Pancreas: Unremarkable. No pancreatic ductal dilatation or surrounding inflammatory changes. Spleen: Normal in size without focal abnormality. Adrenals/Urinary Tract: Adrenal glands appear normal.  Bilateral renal cysts are noted, with the largest measuring 7.9 cm on the left. No hydronephrosis or renal obstruction is noted. No renal or ureteral calculi are noted. Urinary bladder is unremarkable. Stomach/Bowel: Stomach is within normal limits. Appendix appears normal. No evidence of bowel wall thickening, distention, or inflammatory changes. Vascular/Lymphatic: Aortic atherosclerosis. No enlarged abdominal or pelvic lymph nodes. Reproductive: Mild prostatic enlargement is noted. Other: No abdominal wall hernia or abnormality. No abdominopelvic ascites. Musculoskeletal: No acute or significant osseous findings. IMPRESSION: 5 mm nodule seen in right middle lobe. No follow-up needed if patient is low-risk. Non-contrast chest CT can be considered in 12 months if patient is high-risk. This recommendation follows the consensus statement: Guidelines for Management of Incidental Pulmonary Nodules Detected on CT Images: From the Fleischner Society 2017; Radiology 2017; 284:228-243. Small gallstone. Mild prostatic enlargement. No acute abnormality seen in the abdomen or pelvis. Aortic Atherosclerosis (ICD10-I70.0). Electronically Signed   By: Marijo Conception M.D.   On: 10/21/2020 16:03   DG Abdomen  Acute W/Chest  Result Date: 10/15/2020 CLINICAL DATA:  Left flank and left lower quadrant abdominal pain beginning 2 days ago. EXAM: DG ABDOMEN ACUTE WITH 1 VIEW CHEST COMPARISON:  None. FINDINGS: Bowel gas pattern does not show evidence of ileus or obstruction. Amount of fecal matter is within normal limits. No sign of urinary tract stone disease. Ordinary degenerative changes affect the lumbar spine. Evidence of soft tissue mass lesion. Question single gallstone in the right upper quadrant. One-view chest shows normal heart size and mediastinal shadows. The lungs are clear. No free air. No effusions. No significant bone finding. IMPRESSION: No abnormality seen to explain the clinical presentation. Bowel gas pattern is within normal limits. Amount of fecal matter is within normal limits. The patient may have 1 visible gallstone. Electronically Signed   By: Nelson Chimes M.D.   On: 10/15/2020 11:18    Labs:  CBC: Recent Labs    04/13/20 1001 10/15/20 1049  WBC 5.9 7.9  HGB 16.0 16.2  HCT 47.1 47.9  PLT 230 245    COAGS: No results for input(s): INR, APTT in the last 8760 hours.  BMP: Recent Labs    04/13/20 1001 10/15/20 1049  NA 143 133*  K 3.9 3.2*  CL 102 97*  CO2 26 28  GLUCOSE 136* 177*  BUN 13 14  CALCIUM 9.6 9.5  CREATININE 0.98 0.96  GFRNONAA 76 >60  GFRAA 88  --     LIVER FUNCTION TESTS: Recent Labs    04/13/20 1001  BILITOT 0.7  AST 14  ALT 12  ALKPHOS 64  PROT 6.9  ALBUMIN 4.4    TUMOR MARKERS: No results for input(s): AFPTM, CEA, CA199, CHROMGRNA in the last 8760 hours.  Assessment and Plan:  My impression is that this patient has a recent history of left flank pain, and a 7.9 cm cystic left renal lesion.  The lesion is incompletely evaluated on non-contrast CT.  If this represents a simple cyst, may be a source of pain although this is relatively rare but can respond well to aspiration.  If there are complicating features, neoplasm work-up would be  indicated.  I discussed with the patient my thinking about the CT findings.  I recommend renal ultrasound which can easily differentiate a simple cyst of this size from a complex lesion.  If the symptoms recur/persist and the lesion is clearly a simple cyst, we can perform ultrasound-guided aspiration for symptom relief.  In the event that the  ultrasound shows complicating features, will work with urology to proceed accordingly.  He seemed to understand and was agreeable.  He asked appropriate questions, which were answered.  We will plan for renal ultrasound at [facility] at his convenience.  I will get back to him after I see those results.  Thank you for this interesting consult.  I greatly enjoyed meeting ORLEN LEEDY and look forward to participating in their care.  A copy of this report was sent to the requesting provider on this date.  Electronically Signed: Rickard Rhymes 11/11/2020, 1:42 PM   I spent a total of  15 Minutes   in remote  clinical consultation, greater than 50% of which was counseling/coordinating care for left renal cystic lesion.    Visit type: Audio only (telephone). Audio (no video) only due to patient's lack of internet/smartphone capability. Alternative for in-person consultation at [facility], [address], [place], [place]. This visit type was conducted due to national recommendations for restrictions regarding the COVID-19 Pandemic (e.g. social distancing).  This format is felt to be most appropriate for this patient at this time.  All issues noted in this document were discussed and addressed.

## 2020-11-12 ENCOUNTER — Other Ambulatory Visit (HOSPITAL_COMMUNITY): Payer: Self-pay | Admitting: Interventional Radiology

## 2020-11-12 DIAGNOSIS — N2889 Other specified disorders of kidney and ureter: Secondary | ICD-10-CM

## 2020-12-01 ENCOUNTER — Other Ambulatory Visit: Payer: Self-pay

## 2020-12-01 ENCOUNTER — Ambulatory Visit (INDEPENDENT_AMBULATORY_CARE_PROVIDER_SITE_OTHER): Payer: Medicare Other | Admitting: Family Medicine

## 2020-12-01 ENCOUNTER — Encounter: Payer: Self-pay | Admitting: Family Medicine

## 2020-12-01 VITALS — BP 136/70 | HR 80 | Temp 98.1°F | Resp 16 | Wt 226.0 lb

## 2020-12-01 DIAGNOSIS — E119 Type 2 diabetes mellitus without complications: Secondary | ICD-10-CM | POA: Diagnosis not present

## 2020-12-01 DIAGNOSIS — I10 Essential (primary) hypertension: Secondary | ICD-10-CM

## 2020-12-01 LAB — POCT GLYCOSYLATED HEMOGLOBIN (HGB A1C)
Est. average glucose Bld gHb Est-mCnc: 143
Hemoglobin A1C: 6.6 % — AB (ref 4.0–5.6)

## 2020-12-01 NOTE — Progress Notes (Signed)
Established patient visit   Patient: Adrian Thompson   DOB: 05/03/1946   75 y.o. Male  MRN: [id number] Visit Date: 12/01/2020  Today's healthcare provider: Lelon Huh, MD   Chief Complaint  Patient presents with   Diabetes   Hypertension   Subjective    HPI  Diabetes Mellitus Type II, Follow-up  Lab Results  Component Value Date   HGBA1C 6.6 (A) 12/01/2020   HGBA1C 6.4 (A) 08/12/2020   HGBA1C 6.3 (H) 04/13/2020   Wt Readings from Last 3 Encounters:  12/01/20 226 lb (102.5 kg)  10/22/20 220 lb (99.8 kg)  10/21/20 220 lb 0.3 oz (99.8 kg)   Last seen for diabetes 3 months ago.  Management since then includes continue same medication. He reports good compliance with treatment. He is not having side effects.  Symptoms: No fatigue No foot ulcerations  No appetite changes No nausea  No paresthesia of the feet  No polydipsia  No polyuria No visual disturbances   No vomiting     Home blood sugar records: fasting range: 116-130  Episodes of hypoglycemia? No    Current insulin regiment: none Most Recent Eye Exam: not UTD Current exercise: none Current diet habits: in general, an "unhealthy" diet  Pertinent Labs: Lab Results  Component Value Date   CHOL 156 04/13/2020   HDL 66 04/13/2020   LDLCALC 71 04/13/2020   TRIG 109 04/13/2020   CHOLHDL 2.4 04/13/2020   Lab Results  Component Value Date   NA 133 (L) 10/15/2020   K 3.2 (L) 10/15/2020   CREATININE 0.96 10/15/2020   GFRNONAA >60 10/15/2020   GFRAA 88 04/13/2020   GLUCOSE 177 (H) 10/15/2020     ---------------------------------------------------------------------------------------------------   Hypertension, follow-up  BP Readings from Last 3 Encounters:  12/01/20 (!) 160/72  10/22/20 121/62  10/21/20 131/90   Wt Readings from Last 3 Encounters:  12/01/20 226 lb (102.5 kg)  10/22/20 220 lb (99.8 kg)  10/21/20 220 lb 0.3 oz (99.8 kg)     He was last seen for hypertension 3 months ago.  BP  at that visit was 146/73. Management since that visit includes continue same medication.  He reports good compliance with treatment. He is not having side effects.  He is following a Regular diet. He is not exercising. He does not smoke.  Use of agents associated with hypertension: NSAIDS.   Outside blood pressures are averaging 129/69. Symptoms: No chest pain No chest pressure  No palpitations No syncope  No dyspnea No orthopnea  No paroxysmal nocturnal dyspnea No lower extremity edema    The 10-year ASCVD risk score Mikey Bussing DC Jr., et al., 2013) is: 52.1%   ---------------------------------------------------------------------------------------------------      Medications: Outpatient Medications Prior to Visit  Medication Sig   ACCU-CHEK AVIVA PLUS test strip USE ONCE EVERY DAY AS DIRECTED   amLODipine (NORVASC) 10 MG tablet TAKE 1 TABLET EVERY DAY   aspirin 81 MG tablet Take 1 tablet by mouth daily.   atenolol (TENORMIN) 50 MG tablet TAKE 1 TABLET EVERY DAY   azelastine (ASTELIN) 0.1 % nasal spray Place 2 sprays into both nostrils 2 (two) times daily. Use in each nostril as directed   finasteride (PROSCAR) 5 MG tablet Take 1 tablet (5 mg total) by mouth daily.   glucosamine-chondroitin 500-400 MG tablet Take 1 tablet by mouth daily.   HYDROcodone-acetaminophen (NORCO/VICODIN) 5-325 MG tablet Take 1 tablet by mouth every 8 (eight) hours as needed for moderate pain or  severe pain.   indomethacin (INDOCIN) 25 MG capsule Take 1 capsule (25 mg total) by mouth 3 (three) times daily with meals as needed. Up to 3 times a day   losartan-hydrochlorothiazide (HYZAAR) 100-25 MG tablet TAKE 1 TABLET EVERY DAY   meloxicam (MOBIC) 15 MG tablet Take 1 tablet (15 mg total) by mouth daily as needed for pain. As needed   Misc Natural Products (BLACK CHERRY CONCENTRATE PO) Take 1 tablet by mouth 2 (two) times daily.    montelukast (SINGULAIR) 10 MG tablet TAKE 1 TABLET EVERY DAY   Multiple  Vitamins-Minerals (MULTIVITAMIN ADULT PO) Take 1 tablet by mouth daily.   Omega-3 Fatty Acids (FISH OIL) 1000 MG CPDR Take 1 capsule by mouth daily.   omeprazole (PRILOSEC) 20 MG capsule TAKE 1 CAPSULE EVERY DAY   potassium chloride SA (KLOR-CON) 20 MEQ tablet TAKE 1 TABLET EVERY DAY   pravastatin (PRAVACHOL) 20 MG tablet TAKE 1 TABLET EVERY DAY   tamsulosin (FLOMAX) 0.4 MG CAPS capsule TAKE 1 CAPSULE (0.4 MG TOTAL) BY MOUTH DAILY.   Triamcinolone Acetonide (NASACORT AQ NA) Place into the nose daily.   No facility-administered medications prior to visit.    Review of Systems  Constitutional:  Negative for appetite change, chills and fever.  Respiratory:  Negative for chest tightness, shortness of breath and wheezing.   Cardiovascular:  Negative for chest pain and palpitations.  Gastrointestinal:  Positive for abdominal pain (left flank and left upper quadrant of the abdomen). Negative for nausea and vomiting.      Objective    BP (!) 160/72 (BP Location: Left Arm, Patient Position: Sitting, Cuff Size: Large)   Pulse 80   Temp 98.1 F (36.7 C) (Temporal)   Resp 16   Wt 226 lb (102.5 kg)   SpO2 96% Comment: room air  BMI 29.82 kg/m     Physical Exam   General appearance:  Well developed, well nourished male, cooperative and in no acute distress Head: Normocephalic, without obvious abnormality, atraumatic Respiratory: Respirations even and unlabored, normal respiratory rate Extremities: All extremities are intact.  Skin: Skin color, texture, turgor normal. No rashes seen  Psych: Appropriate mood and affect. Neurologic: Mental status: Alert, oriented to person, place, and time, thought content appropriate.   Results for orders placed or performed in visit on 12/01/20  POCT HgB A1C  Result Value Ref Range   Hemoglobin A1C 6.6 (A) 4.0 - 5.6 %   Est. average glucose Bld gHb Est-mCnc 143     Assessment & Plan     1. Type 2 diabetes mellitus without complication, without  long-term current use of insulin (HCC) Well controlled with diet. Recheck q4-6 mos.   2. Essential (primary) hypertension Well controlled.  Continue current medications.    Follow up AWV in December.       The entirety of the information documented in the History of Present Illness, Review of Systems and Physical Exam were personally obtained by me. Portions of this information were initially documented by the CMA and reviewed by me for thoroughness and accuracy.     Lelon Huh, MD  [facility] [phone] (phone) [phone] (fax)  [place]

## 2020-12-03 ENCOUNTER — Other Ambulatory Visit: Payer: Self-pay | Admitting: Family Medicine

## 2020-12-03 DIAGNOSIS — N401 Enlarged prostate with lower urinary tract symptoms: Secondary | ICD-10-CM

## 2020-12-03 DIAGNOSIS — E876 Hypokalemia: Secondary | ICD-10-CM

## 2020-12-03 DIAGNOSIS — I10 Essential (primary) hypertension: Secondary | ICD-10-CM

## 2020-12-03 DIAGNOSIS — N138 Other obstructive and reflux uropathy: Secondary | ICD-10-CM

## 2020-12-14 ENCOUNTER — Other Ambulatory Visit: Payer: Self-pay

## 2020-12-14 ENCOUNTER — Ambulatory Visit
Admission: RE | Admit: 2020-12-14 | Discharge: 2020-12-14 | Disposition: A | Discharge status: Home / Self Care | Payer: Medicare Other | Source: Ambulatory Visit | Attending: Interventional Radiology | Admitting: Interventional Radiology

## 2020-12-14 DIAGNOSIS — N281 Cyst of kidney, acquired: Secondary | ICD-10-CM | POA: Diagnosis not present

## 2020-12-14 DIAGNOSIS — N2889 Other specified disorders of kidney and ureter: Secondary | ICD-10-CM | POA: Insufficient documentation

## 2020-12-30 ENCOUNTER — Other Ambulatory Visit (HOSPITAL_COMMUNITY): Payer: Self-pay | Admitting: Interventional Radiology

## 2020-12-30 ENCOUNTER — Other Ambulatory Visit: Payer: Self-pay | Admitting: Radiology

## 2020-12-30 ENCOUNTER — Other Ambulatory Visit: Payer: Self-pay | Admitting: Interventional Radiology

## 2020-12-30 ENCOUNTER — Encounter: Payer: Self-pay | Admitting: *Deleted

## 2020-12-30 ENCOUNTER — Other Ambulatory Visit: Payer: Self-pay

## 2020-12-30 ENCOUNTER — Ambulatory Visit
Admission: RE | Admit: 2020-12-30 | Discharge: 2020-12-30 | Disposition: A | Discharge status: Home / Self Care | Payer: Medicare Other | Source: Ambulatory Visit | Attending: Interventional Radiology | Admitting: Interventional Radiology

## 2020-12-30 DIAGNOSIS — N2889 Other specified disorders of kidney and ureter: Secondary | ICD-10-CM

## 2020-12-30 DIAGNOSIS — N281 Cyst of kidney, acquired: Secondary | ICD-10-CM

## 2020-12-30 HISTORY — PX: IR RADIOLOGIST EVAL & MGMT: IMG5224

## 2020-12-30 NOTE — Progress Notes (Signed)
Patient ID: Adrian Thompson, male   DOB: Aug 01, 1945, 75 y.o.   MRN: [id number]       Chief Complaint: Patient was consulted remotely today (TeleHealth) for left renal mass at the request of [place].    Referring Physician(s): Sninsky,Brian C   History of Present Illness: Adrian Thompson is a 75 y.o. male With a history of BPH who presented to urology for routine follow-up, and was complaining of left-sided flank pain.  This started without evident inciting event, trauma or fall.  He has not had this sort of pain before.  He describes it is somewhat migratory.  He does however state that since his visit with urology 3 weeks ago the symptoms have largely abated.  No hematuria. 10/21/2020 CT demonstrated 7.9 cm cystic lesion from the lower pole left kidney  12/14/2020 ultrasound confirmed simple nature of this lesion.  Continues to have left flank symptoms.  No hematuria.  Past Medical History:  Diagnosis Date   Diabetes mellitus without complication ([place])    type 2   Gout    History of chicken pox    History of measles    History of mumps    Hyperlipidemia    Hypertension     Past Surgical History:  Procedure Laterality Date   COCHLEAR IMPLANT Right 06/28/2016   Vira Blanco, MD [facility]   COLONOSCOPY  2009   COLONOSCOPY WITH PROPOFOL N/A 04/04/2018   Procedure: COLONOSCOPY WITH PROPOFOL;  Surgeon: Robert Bellow, MD;  Location: [facility] ENDOSCOPY;  Service: Endoscopy;  Laterality: N/A;   EXTERNAL EAR SURGERY  2018   IR RADIOLOGIST EVAL & MGMT  11/11/2020   TONSILLECTOMY     UPPER GI ENDOSCOPY  2009   UPPER GI ENDOSCOPY  03/04/2011   [facility]; Excision of multiple gastric polyps    Allergies: Patient has no known allergies.  Medications: Prior to Admission medications   Medication Sig Start Date End Date Taking? Authorizing Provider  ACCU-CHEK AVIVA PLUS test strip USE ONCE EVERY DAY AS DIRECTED 07/25/20   Birdie Sons, MD  amLODipine (NORVASC) 10 MG tablet TAKE 1 TABLET  EVERY DAY 12/03/20   Birdie Sons, MD  aspirin 81 MG tablet Take 1 tablet by mouth daily. 11/10/11   [provider]  atenolol (TENORMIN) 50 MG tablet TAKE 1 TABLET EVERY DAY 09/18/20   Birdie Sons, MD  azelastine (ASTELIN) 0.1 % nasal spray Place 2 sprays into both nostrils 2 (two) times daily. Use in each nostril as directed 04/13/20   Birdie Sons, MD  finasteride (PROSCAR) 5 MG tablet TAKE 1 TABLET EVERY DAY 12/03/20   Birdie Sons, MD  glucosamine-chondroitin 500-400 MG tablet Take 1 tablet by mouth daily. 02/07/07   [provider]  HYDROcodone-acetaminophen (NORCO/VICODIN) 5-325 MG tablet Take 1 tablet by mouth every 8 (eight) hours as needed for moderate pain or severe pain. 10/21/20   Coral Spikes, DO  indomethacin (INDOCIN) 25 MG capsule Take 1 capsule (25 mg total) by mouth 3 (three) times daily with meals as needed. Up to 3 times a day 01/08/18   Birdie Sons, MD  losartan-hydrochlorothiazide [facility]) 100-25 MG tablet TAKE 1 TABLET EVERY DAY 12/13/19   Birdie Sons, MD  meloxicam (MOBIC) 15 MG tablet Take 1 tablet (15 mg total) by mouth daily as needed for pain. As needed 04/13/20   Birdie Sons, MD  Misc Natural Products (BLACK CHERRY CONCENTRATE PO) Take 1 tablet by mouth 2 (two) times  daily.     [provider]  montelukast (SINGULAIR) 10 MG tablet TAKE 1 TABLET EVERY DAY 03/21/19   Birdie Sons, MD  Multiple Vitamins-Minerals (MULTIVITAMIN ADULT PO) Take 1 tablet by mouth daily. 09/25/08   [provider]  Omega-3 Fatty Acids (FISH OIL) 1000 MG CPDR Take 1 capsule by mouth daily. 11/10/11   [provider]  omeprazole (PRILOSEC) 20 MG capsule TAKE 1 CAPSULE EVERY DAY 09/18/20   Birdie Sons, MD  potassium chloride SA (KLOR-CON) 20 MEQ tablet TAKE 1 TABLET EVERY DAY 12/03/20   Birdie Sons, MD  pravastatin (PRAVACHOL) 20 MG tablet TAKE 1 TABLET EVERY DAY 08/31/20   Birdie Sons, MD  tamsulosin (FLOMAX) 0.4 MG  CAPS capsule TAKE 1 CAPSULE (0.4 MG TOTAL) BY MOUTH DAILY. 09/17/20   Birdie Sons, MD  Triamcinolone Acetonide (NASACORT AQ NA) Place into the nose daily.    [provider]     Family History  Problem Relation Age of Onset   Hypertension Mother    Hypertension Sister     Social History   Socioeconomic History   Marital status: Married    Spouse name: Not on file   Number of children: 0   Years of education: Not on file   Highest education level: Associate degree: occupational, Hotel manager, or vocational program  Occupational History   Occupation: Retired  Tobacco Use   Smoking status: Never   Smokeless tobacco: Former    Types: Snuff, Database administrator Use   Vaping Use: Never used  Substance and Sexual Activity   Alcohol use: No    Alcohol/week: 0.0 standard drinks   Drug use: No   Sexual activity: Not on file  Other Topics Concern   Not on file  Social History Narrative   Not on file   Social Determinants of Health   Financial Resource Strain: Low Risk    Difficulty of Paying Living Expenses: Not hard at all  Food Insecurity: No Food Insecurity   Worried About Charity fundraiser in the Last Year: Never true   Alger in the Last Year: Never true  Transportation Needs: No Transportation Needs   Lack of Transportation (Medical): No   Lack of Transportation (Non-Medical): No  Physical Activity: Inactive   Days of Exercise per Week: 0 days   Minutes of Exercise per Session: 0 min  Stress: No Stress Concern Present   Feeling of Stress : Not at all  Social Connections: Moderately Isolated   Frequency of Communication with Friends and Family: Three times a week   Frequency of Social Gatherings with Friends and Family: Three times a week   Attends Religious Services: Never   Active Member of Clubs or Organizations: No   Attends Archivist Meetings: Never   Marital Status: Married    ECOG Status: 2 - Symptomatic, <50% confined to  bed  Review of Systems  Review of Systems: A 12 point ROS discussed and pertinent positives are indicated in the HPI above.  All other systems are negative.  Physical Exam No direct physical exam was performed (except for noted visual exam findings with Video Visits).     Vital Signs: There were no vitals taken for this visit.  Imaging: US RENAL  Result Date: 12/14/2020 CLINICAL DATA:  Renal mass EXAM: RENAL / URINARY TRACT ULTRASOUND COMPLETE COMPARISON:  CT abdomen pelvis 10/21/2020 FINDINGS: Right Kidney: Renal measurements: 11.5 x 6.4 x 5.2 cm = volume:  197 mL. Echogenicity within normal limits. No mass or hydronephrosis visualized. 2.1 cm simple cyst noted in the upper pole of the right kidney. Left Kidney: Renal measurements: 13 x 5 x 6.2 x 6.9 cm = volume: 304 mL. Echogenicity within normal limits. No mass or hydronephrosis visualized. 7.7 cm simple cyst in the lower pole the left kidney. Bladder: Appears normal for degree of bladder distention. Other: None. IMPRESSION: Simple bilateral renal cysts. Electronically Signed   By: Miachel Roux M.D.   On: 12/14/2020 15:28    Labs:  CBC: Recent Labs    04/13/20 1001 10/15/20 1049  WBC 5.9 7.9  HGB 16.0 16.2  HCT 47.1 47.9  PLT 230 245    COAGS: No results for input(s): INR, APTT in the last 8760 hours.  BMP: Recent Labs    04/13/20 1001 10/15/20 1049  NA 143 133*  K 3.9 3.2*  CL 102 97*  CO2 26 28  GLUCOSE 136* 177*  BUN 13 14  CALCIUM 9.6 9.5  CREATININE 0.98 0.96  GFRNONAA 76 >60  GFRAA 88  --     LIVER FUNCTION TESTS: Recent Labs    04/13/20 1001  BILITOT 0.7  AST 14  ALT 12  ALKPHOS 64  PROT 6.9  ALBUMIN 4.4    TUMOR MARKERS: No results for input(s): AFPTM, CEA, CA199, CHROMGRNA in the last 8760 hours.  Assessment and Plan:  My impression is that this patient has a recent history of left flank pain, and a 7.9 cm simple left renal cyst.  The lesion is incompletely evaluated on non-contrast CT.  this may be a source of pain although this is relatively rare but can respond well to aspiration.  We can send fluid for cytology. I discussed the findings of the imaging studies with the patient.  He like to proceed with aspiration and attempts to gain better symptom control.  We can perform ultrasound-guided aspiration for symptom relief..  We can set this up electively at [facility] hospital under ultrasound guidance, requiring local only.  He seemed to understand and was agreeable.  He asked appropriate questions, which were answered.  We will plan for renal ultrasound and aspiration at [facility] at his convenience.  I will get back to him after I see those cytology results.   Thank you for this interesting consult.  I greatly enjoyed meeting Adrian Thompson and look forward to participating in their care.  A copy of this report was sent to the requesting provider on this date.  Electronically Signed: Rickard Rhymes 12/30/2020, 3:32 PM   I spent a total of    15 Minutes in remote  clinical consultation, greater than 50% of which was counseling/coordinating care for left flank pain and presence of left renal cyst.    Visit type: Audio only (telephone). Audio (no video) only due to patient's lack of internet/smartphone capability. Alternative for in-person consultation at [facility], [address] [place], [place], [place]. This visit type was conducted due to national recommendations for restrictions regarding the COVID-19 Pandemic (e.g. social distancing).  This format is felt to be most appropriate for this patient at this time.  All issues noted in this document were discussed and addressed.

## 2020-12-31 ENCOUNTER — Ambulatory Visit
Admission: RE | Admit: 2020-12-31 | Discharge: 2020-12-31 | Disposition: A | Discharge status: Home / Self Care | Payer: Medicare Other | Source: Ambulatory Visit | Attending: Interventional Radiology | Admitting: Interventional Radiology

## 2020-12-31 ENCOUNTER — Other Ambulatory Visit: Payer: Self-pay

## 2020-12-31 DIAGNOSIS — N281 Cyst of kidney, acquired: Secondary | ICD-10-CM | POA: Diagnosis not present

## 2020-12-31 DIAGNOSIS — R109 Unspecified abdominal pain: Secondary | ICD-10-CM | POA: Insufficient documentation

## 2020-12-31 NOTE — Procedures (Signed)
  Procedure: US aspiration LLP renal cyst 180m pale clear yellow, for cytology EBL:   minimal Complications:  none immediate  See full dictation in [facility]  DDillard CannonMD Main # [phone]  [phone]

## 2021-01-04 LAB — CYTOLOGY - NON PAP

## 2021-01-07 DIAGNOSIS — L820 Inflamed seborrheic keratosis: Secondary | ICD-10-CM | POA: Diagnosis not present

## 2021-01-07 DIAGNOSIS — D2262 Melanocytic nevi of left upper limb, including shoulder: Secondary | ICD-10-CM | POA: Diagnosis not present

## 2021-01-07 DIAGNOSIS — D2272 Melanocytic nevi of left lower limb, including hip: Secondary | ICD-10-CM | POA: Diagnosis not present

## 2021-01-07 DIAGNOSIS — D2271 Melanocytic nevi of right lower limb, including hip: Secondary | ICD-10-CM | POA: Diagnosis not present

## 2021-01-07 DIAGNOSIS — L538 Other specified erythematous conditions: Secondary | ICD-10-CM | POA: Diagnosis not present

## 2021-01-07 DIAGNOSIS — D2261 Melanocytic nevi of right upper limb, including shoulder: Secondary | ICD-10-CM | POA: Diagnosis not present

## 2021-01-07 DIAGNOSIS — R208 Other disturbances of skin sensation: Secondary | ICD-10-CM | POA: Diagnosis not present

## 2021-01-07 DIAGNOSIS — L821 Other seborrheic keratosis: Secondary | ICD-10-CM | POA: Diagnosis not present

## 2021-01-07 DIAGNOSIS — D225 Melanocytic nevi of trunk: Secondary | ICD-10-CM | POA: Diagnosis not present

## 2021-01-11 NOTE — Progress Notes (Signed)
Established patient visit   Patient: Adrian Thompson   DOB: Apr 09, 1946   75 y.o. Male  MRN: [id number] Visit Date: 01/12/2021  Today's healthcare provider: Lelon Huh, MD   Chief Complaint  Patient presents with   Flank Pain   Hand Pain   Subjective  -------------------------------------------------------------------------------------------------------------------- HPI  Left Flank pain: Patient complains of pain on his left side. This pain has been ongoing for several months. He states he has seen a specialist and was told there was a cyst on his kidney. He reports having the cyst drained 2 weeks ago. Prior to aspiration pain had at times been severe, but has only had some mild discomfort since then. Also having pain in shoulders, neck, mid and lower back getting worse over last few months.   Thumb pain: Patient complains of pain and swelling of the left thumb since yesterday. He believes he has a gout flare up. He took one dose of Indomethacin yesterday which has helped improve the pain.      Medications: Outpatient Medications Prior to Visit  Medication Sig   ACCU-CHEK AVIVA PLUS test strip USE ONCE EVERY DAY AS DIRECTED   amLODipine (NORVASC) 10 MG tablet TAKE 1 TABLET EVERY DAY   aspirin 81 MG tablet Take 1 tablet by mouth daily.   atenolol (TENORMIN) 50 MG tablet TAKE 1 TABLET EVERY DAY   azelastine (ASTELIN) 0.1 % nasal spray Place 2 sprays into both nostrils 2 (two) times daily. Use in each nostril as directed   finasteride (PROSCAR) 5 MG tablet TAKE 1 TABLET EVERY DAY   glucosamine-chondroitin 500-400 MG tablet Take 1 tablet by mouth daily.   HYDROcodone-acetaminophen (NORCO/VICODIN) 5-325 MG tablet Take 1 tablet by mouth every 8 (eight) hours as needed for moderate pain or severe pain.   indomethacin (INDOCIN) 25 MG capsule Take 1 capsule (25 mg total) by mouth 3 (three) times daily with meals as needed. Up to 3 times a day   losartan-hydrochlorothiazide (HYZAAR)  100-25 MG tablet TAKE 1 TABLET EVERY DAY   meloxicam (MOBIC) 15 MG tablet Take 1 tablet (15 mg total) by mouth daily as needed for pain. As needed   Misc Natural Products (BLACK CHERRY CONCENTRATE PO) Take 1 tablet by mouth 2 (two) times daily.    montelukast (SINGULAIR) 10 MG tablet TAKE 1 TABLET EVERY DAY   Multiple Vitamins-Minerals (MULTIVITAMIN ADULT PO) Take 1 tablet by mouth daily.   Omega-3 Fatty Acids (FISH OIL) 1000 MG CPDR Take 1 capsule by mouth daily.   omeprazole (PRILOSEC) 20 MG capsule TAKE 1 CAPSULE EVERY DAY   potassium chloride SA (KLOR-CON) 20 MEQ tablet TAKE 1 TABLET EVERY DAY   pravastatin (PRAVACHOL) 20 MG tablet TAKE 1 TABLET EVERY DAY   tamsulosin (FLOMAX) 0.4 MG CAPS capsule TAKE 1 CAPSULE (0.4 MG TOTAL) BY MOUTH DAILY.   Triamcinolone Acetonide (NASACORT AQ NA) Place into the nose daily.   No facility-administered medications prior to visit.    Review of Systems  Constitutional:  Negative for appetite change, chills and fever.  Respiratory:  Negative for chest tightness, shortness of breath and wheezing.   Cardiovascular:  Negative for chest pain and palpitations.  Gastrointestinal:  Negative for abdominal pain, nausea and vomiting.  Musculoskeletal:  Positive for arthralgias (left thumb) and joint swelling (left thumb).      Objective  -------------------------------------------------------------------------------------------------------------------- BP (!) 163/84 (BP Location: Right Arm, Patient Position: Sitting, Cuff Size: Normal)   Pulse (!) 50   Temp 97.7 F (  36.5 C) (Temporal)   Resp 16   Wt 221 lb (100.2 kg)   BMI 29.16 kg/m     Physical Exam  General appearance:  Overweight male, cooperative and in no acute distress Head: Normocephalic, without obvious abnormality, atraumatic Respiratory: Respirations even and unlabored, normal respiratory rate Extremities: All extremities are intact.  Skin: Skin color, texture, turgor normal. No rashes  seen  Psych: Appropriate mood and affect. Neurologic: Mental status: Alert, oriented to person, place, and time, thought content appropriate.     Assessment & Plan  ---------------------------------------------------------------------------------------------------------------------- 1. Neck pain  - DG Cervical Spine Complete; Future - DG Thoracic Spine W/Swimmers; Future - DG Lumbar Spine Complete; Future  2. Other acute back pain  - DG Cervical Spine Complete; Future - DG Thoracic Spine W/Swimmers; Future - DG Lumbar Spine Complete; Future  Flank pain has greatly improvement although no resolved since aspiration of renal cyst.   3. Pain of left thumb Likely gout, is doing much better since taking indomethacin yesterday.         The entirety of the information documented in the History of Present Illness, Review of Systems and Physical Exam were personally obtained by me. Portions of this information were initially documented by the CMA and reviewed by me for thoroughness and accuracy.     Lelon Huh, MD  [facility] [phone] (phone) [phone] (fax)  [place]

## 2021-01-12 ENCOUNTER — Encounter: Payer: Self-pay | Admitting: Family Medicine

## 2021-01-12 ENCOUNTER — Ambulatory Visit
Admission: RE | Admit: 2021-01-12 | Discharge: 2021-01-12 | Disposition: A | Discharge status: Home / Self Care | Payer: Medicare Other | Source: Ambulatory Visit | Attending: Family Medicine | Admitting: Family Medicine

## 2021-01-12 ENCOUNTER — Ambulatory Visit
Admission: RE | Admit: 2021-01-12 | Discharge: 2021-01-12 | Disposition: A | Discharge status: Home / Self Care | Payer: Medicare Other | Attending: Family Medicine | Admitting: Family Medicine

## 2021-01-12 ENCOUNTER — Other Ambulatory Visit: Payer: Self-pay

## 2021-01-12 ENCOUNTER — Ambulatory Visit (INDEPENDENT_AMBULATORY_CARE_PROVIDER_SITE_OTHER): Payer: Medicare Other | Admitting: Family Medicine

## 2021-01-12 VITALS — BP 163/84 | HR 50 | Temp 97.7°F | Resp 16 | Wt 221.0 lb

## 2021-01-12 DIAGNOSIS — M549 Dorsalgia, unspecified: Secondary | ICD-10-CM | POA: Diagnosis not present

## 2021-01-12 DIAGNOSIS — M542 Cervicalgia: Secondary | ICD-10-CM | POA: Insufficient documentation

## 2021-01-12 DIAGNOSIS — I700 Atherosclerosis of aorta: Secondary | ICD-10-CM | POA: Insufficient documentation

## 2021-01-12 DIAGNOSIS — M79645 Pain in left finger(s): Secondary | ICD-10-CM

## 2021-01-12 DIAGNOSIS — M47816 Spondylosis without myelopathy or radiculopathy, lumbar region: Secondary | ICD-10-CM | POA: Diagnosis not present

## 2021-01-12 DIAGNOSIS — M47814 Spondylosis without myelopathy or radiculopathy, thoracic region: Secondary | ICD-10-CM | POA: Diagnosis not present

## 2021-01-12 DIAGNOSIS — M545 Low back pain, unspecified: Secondary | ICD-10-CM | POA: Diagnosis not present

## 2021-01-12 LAB — HM DIABETES EYE EXAM

## 2021-01-12 NOTE — Patient Instructions (Signed)
Go to the [facility] on [facility] for neck and back Xrays

## 2021-01-13 ENCOUNTER — Other Ambulatory Visit: Payer: Self-pay | Admitting: Family Medicine

## 2021-01-13 DIAGNOSIS — M549 Dorsalgia, unspecified: Secondary | ICD-10-CM

## 2021-01-13 DIAGNOSIS — M542 Cervicalgia: Secondary | ICD-10-CM

## 2021-01-13 DIAGNOSIS — M5489 Other dorsalgia: Secondary | ICD-10-CM

## 2021-01-13 MED ORDER — PREDNISONE 10 MG PO TABS: ORAL_TABLET | ORAL | 0 refills | Status: AC

## 2021-01-29 ENCOUNTER — Encounter: Payer: Self-pay | Admitting: Family Medicine

## 2021-02-01 MED ORDER — AMITRIPTYLINE HCL 25 MG PO TABS: 25.0000 mg | ORAL_TABLET | Freq: Every evening | ORAL | 1 refills | Status: DC | PRN

## 2021-02-03 ENCOUNTER — Ambulatory Visit: Payer: Medicare Other | Admitting: Family Medicine

## 2021-02-23 DIAGNOSIS — Z23 Encounter for immunization: Secondary | ICD-10-CM | POA: Diagnosis not present

## 2021-03-03 ENCOUNTER — Telehealth: Payer: Self-pay | Admitting: Family Medicine

## 2021-03-03 MED ORDER — PRAVASTATIN SODIUM 20 MG PO TABS: 20.0000 mg | ORAL_TABLET | Freq: Every day | ORAL | 0 refills | Status: DC

## 2021-03-03 NOTE — Telephone Encounter (Signed)
[facility] faxed refill request for the following medications:  pravastatin (PRAVACHOL) 20 MG tablet   Please advise.

## 2021-03-10 ENCOUNTER — Other Ambulatory Visit: Payer: Self-pay | Admitting: Family Medicine

## 2021-03-16 ENCOUNTER — Encounter: Payer: Self-pay | Admitting: Family Medicine

## 2021-03-17 NOTE — Addendum Note (Signed)
Addended by: Randal Buba on: 03/17/2021 03:37 PM   Modules accepted: Orders

## 2021-03-17 NOTE — Telephone Encounter (Signed)
Please advise if ok to approve refills to get patient by until next appointment which is scheduled for 05/03/2021

## 2021-03-18 MED ORDER — AMITRIPTYLINE HCL 25 MG PO TABS: 25.0000 mg | ORAL_TABLET | Freq: Every evening | ORAL | 1 refills | Status: DC | PRN

## 2021-03-19 ENCOUNTER — Ambulatory Visit: Payer: Medicare Other | Admitting: Family Medicine

## 2021-04-29 DIAGNOSIS — H903 Sensorineural hearing loss, bilateral: Secondary | ICD-10-CM | POA: Diagnosis not present

## 2021-05-03 ENCOUNTER — Other Ambulatory Visit: Payer: Self-pay

## 2021-05-03 ENCOUNTER — Ambulatory Visit (INDEPENDENT_AMBULATORY_CARE_PROVIDER_SITE_OTHER): Payer: Medicare Other | Admitting: Family Medicine

## 2021-05-03 ENCOUNTER — Encounter: Payer: Self-pay | Admitting: Family Medicine

## 2021-05-03 VITALS — BP 138/80 | HR 53 | Temp 98.3°F | Wt 224.0 lb

## 2021-05-03 DIAGNOSIS — Z23 Encounter for immunization: Secondary | ICD-10-CM | POA: Diagnosis not present

## 2021-05-03 DIAGNOSIS — E119 Type 2 diabetes mellitus without complications: Secondary | ICD-10-CM | POA: Diagnosis not present

## 2021-05-03 DIAGNOSIS — I700 Atherosclerosis of aorta: Secondary | ICD-10-CM

## 2021-05-03 DIAGNOSIS — Z125 Encounter for screening for malignant neoplasm of prostate: Secondary | ICD-10-CM | POA: Diagnosis not present

## 2021-05-03 DIAGNOSIS — G4700 Insomnia, unspecified: Secondary | ICD-10-CM | POA: Diagnosis not present

## 2021-05-03 DIAGNOSIS — Z0000 Encounter for general adult medical examination without abnormal findings: Secondary | ICD-10-CM

## 2021-05-03 DIAGNOSIS — I10 Essential (primary) hypertension: Secondary | ICD-10-CM | POA: Diagnosis not present

## 2021-05-03 DIAGNOSIS — E785 Hyperlipidemia, unspecified: Secondary | ICD-10-CM

## 2021-05-03 DIAGNOSIS — E876 Hypokalemia: Secondary | ICD-10-CM | POA: Diagnosis not present

## 2021-05-03 DIAGNOSIS — K219 Gastro-esophageal reflux disease without esophagitis: Secondary | ICD-10-CM | POA: Diagnosis not present

## 2021-05-03 MED ORDER — TETANUS-DIPHTH-ACELL PERTUSSIS 5-2.5-18.5 LF-MCG/0.5 IM SUSY: 0.5000 mL | PREFILLED_SYRINGE | Freq: Once | INTRAMUSCULAR | 0 refills | Status: AC

## 2021-05-03 MED ORDER — AMITRIPTYLINE HCL 25 MG PO TABS: 25.0000 mg | ORAL_TABLET | Freq: Every evening | ORAL | 1 refills | Status: DC | PRN

## 2021-05-03 NOTE — Patient Instructions (Signed)
Please review the attached list of medications and notify my office if there are any errors.   You are due for a Tdap (tetanus-diptheria-pertussis vaccine) which protects you from tetanus and whooping cough. Please check with your insurance plan or pharmacy regarding coverage for this vaccine.   

## 2021-05-03 NOTE — Progress Notes (Signed)
Annual Wellness Visit     Patient: Adrian Thompson, Male    DOB: Nov 23, 1945, 75 y.o.   MRN: [id number] Visit Date: 05/03/2021  Today's Provider: Lelon Huh, MD   No chief complaint on file.  Subjective    Adrian Thompson is a 75 y.o. male who presents today for his Annual Wellness Visit. He reports consuming a general diet. The patient does not participate in regular exercise at present. He generally feels fairly well. He reports sleeping well. He does have additional problems to discuss today.   Abdominal Pain This is a chronic (Pt reports the pain started in June 2022) problem. The problem has been gradually improving (Pt states the pain as improved but is not completely gone.). The pain is located in the LUQ. The quality of the pain is aching. Pertinent negatives include no anorexia, arthralgias, constipation, diarrhea, fever, myalgias, nausea or vomiting.  Has had extensive negative GI workup. Was prescribed amitriptyline for presumed neuropathic pain. He states this had helped sleep quite a bit. Still gets abdominal discomfort intermittently, but is usually tolerable.   He is also due for follow up hypertension, diabetes, and lipids, doing well on current medications.  Lab Results  Component Value Date   HGBA1C 6.6 (A) 12/01/2020   Last lipids Lab Results  Component Value Date   CHOL 156 04/13/2020   HDL 66 04/13/2020   LDLCALC 71 04/13/2020   TRIG 109 04/13/2020   CHOLHDL 2.4 04/13/2020    Lab Results  Component Value Date   CREATININE 0.96 10/15/2020   BUN 14 10/15/2020   NA 133 (L) 10/15/2020   K 3.2 (L) 10/15/2020   CL 97 (L) 10/15/2020   CO2 28 10/15/2020      Medications: Outpatient Medications Prior to Visit  Medication Sig   ACCU-CHEK AVIVA PLUS test strip USE ONCE EVERY DAY AS DIRECTED   amitriptyline (ELAVIL) 25 MG tablet Take 1 tablet (25 mg total) by mouth at bedtime as needed for sleep.   amLODipine (NORVASC) 10 MG tablet TAKE 1 TABLET EVERY DAY    aspirin 81 MG tablet Take 1 tablet by mouth daily.   atenolol (TENORMIN) 50 MG tablet TAKE 1 TABLET EVERY DAY   azelastine (ASTELIN) 0.1 % nasal spray Place 2 sprays into both nostrils 2 (two) times daily. Use in each nostril as directed   finasteride (PROSCAR) 5 MG tablet TAKE 1 TABLET EVERY DAY   glucosamine-chondroitin 500-400 MG tablet Take 1 tablet by mouth daily.   HYDROcodone-acetaminophen (NORCO/VICODIN) 5-325 MG tablet Take 1 tablet by mouth every 8 (eight) hours as needed for moderate pain or severe pain.   indomethacin (INDOCIN) 25 MG capsule Take 1 capsule (25 mg total) by mouth 3 (three) times daily with meals as needed. Up to 3 times a day   losartan-hydrochlorothiazide (HYZAAR) 100-25 MG tablet TAKE 1 TABLET EVERY DAY   meloxicam (MOBIC) 15 MG tablet Take 1 tablet (15 mg total) by mouth daily as needed for pain. As needed   Misc Natural Products (BLACK CHERRY CONCENTRATE PO) Take 1 tablet by mouth 2 (two) times daily.    montelukast (SINGULAIR) 10 MG tablet TAKE 1 TABLET EVERY DAY   Multiple Vitamins-Minerals (MULTIVITAMIN ADULT PO) Take 1 tablet by mouth daily.   Omega-3 Fatty Acids (FISH OIL) 1000 MG CPDR Take 1 capsule by mouth daily.   omeprazole (PRILOSEC) 20 MG capsule TAKE 1 CAPSULE EVERY DAY   potassium chloride SA (KLOR-CON) 20 MEQ tablet TAKE 1  TABLET EVERY DAY   pravastatin (PRAVACHOL) 20 MG tablet Take 1 tablet (20 mg total) by mouth daily.   tamsulosin (FLOMAX) 0.4 MG CAPS capsule TAKE 1 CAPSULE (0.4 MG TOTAL) BY MOUTH DAILY.   Triamcinolone Acetonide (NASACORT AQ NA) Place into the nose daily.   No facility-administered medications prior to visit.    No Known Allergies  Patient Care Team: Birdie Sons, MD as PCP - General (Family Medicine) Bary Castilla, Forest Gleason, MD (General Surgery) Oneta Rack, MD (Dermatology) Throat,  Ear Nose And [facility])  Review of Systems  Constitutional: Negative.  Negative for fever.  HENT:   Positive for hearing loss. Negative for congestion, dental problem, drooling, ear discharge, ear pain, facial swelling, mouth sores, nosebleeds, postnasal drip, rhinorrhea, sinus pressure, sinus pain, sneezing, sore throat, tinnitus, trouble swallowing and voice change.   Eyes: Negative.   Respiratory: Negative.    Cardiovascular: Negative.   Gastrointestinal:  Positive for abdominal pain. Negative for abdominal distention, anorexia, constipation, diarrhea, nausea and vomiting.  Endocrine: Negative.   Genitourinary: Negative.   Musculoskeletal:  Positive for back pain. Negative for arthralgias, gait problem, joint swelling, myalgias, neck pain and neck stiffness.  Skin: Negative.   Allergic/Immunologic: Negative.   Neurological: Negative.   Hematological: Negative.   Psychiatric/Behavioral: Negative.         Objective    Vitals: BP (!) 152/78 (BP Location: Right Arm, Patient Position: Sitting, Cuff Size: Large)   Pulse (!) 53   Temp 98.3 F (36.8 C) (Oral)   Wt 224 lb (101.6 kg)   SpO2 99%   BMI 29.55 kg/m    Physical Exam.   General Appearance:     Well developed, well nourished male. Alert, cooperative, in no acute distress, appears stated age  Head:    Normocephalic, without obvious abnormality, atraumatic  Eyes:    PERRL, conjunctiva/corneas clear, EOM's intact, fundi    benign, both eyes       Ears:    Normal TM's and external ear canals, both ears  Lungs:     Clear to auscultation bilaterally, respirations unlabored  Chest wall:    No tenderness or deformity  Heart:    Bradycardic. Normal rhythm. No murmurs, rubs, or gallops.  S1 and S2 normal  Abdomen:     Soft, non-tender, bowel sounds active all four quadrants,    no masses, no organomegaly  Extremities:   All extremities are intact. No cyanosis or edema. Diabetic foot exam was performed.  No deformities or other abnormal visual findings.  Posterior tibialis and dorsalis pulse intact bilaterally.  Intact to touch  and monofilament testing bilaterally.    Pulses:   2+ and symmetric all extremities  Skin:   Skin color, texture, turgor normal, no rashes or lesions     Most recent functional status assessment: In your present state of health, do you have any difficulty performing the following activities: 05/03/2021  Hearing? Y  Vision? Y  Difficulty concentrating or making decisions? Y  Walking or climbing stairs? N  Dressing or bathing? N  Doing errands, shopping? N  Some recent data might be hidden   Most recent fall risk assessment: Fall Risk  05/03/2021  Falls in the past year? 0  Comment -  Number falls in past yr: 0  Injury with Fall? 0  Risk for fall due to : No Fall Risks  Follow up Falls evaluation completed    Most recent depression screenings: PHQ 2/9 Scores 05/03/2021 08/12/2020  PHQ - 2 Score 0 0  PHQ- 9 Score 1 3   Most recent cognitive screening: No flowsheet data found. Most recent Audit-C alcohol use screening Alcohol Use Disorder Test (AUDIT) 05/03/2021  1. How often do you have a drink containing alcohol? 0  2. How many drinks containing alcohol do you have on a typical day when you are drinking? 0  3. How often do you have six or more drinks on one occasion? 0  AUDIT-C Score 0  Alcohol Brief Interventions/Follow-up -   A score of 3 or more in women, and 4 or more in men indicates increased risk for alcohol abuse, EXCEPT if all of the points are from question 1   No results found for any visits on 05/03/21.  Assessment & Plan     Annual wellness visit done today including the all of the following: Reviewed patient's Family Medical History Reviewed and updated list of patient's medical providers Assessment of cognitive impairment was done Assessed patient's functional ability Established a written schedule for health screening [place] Completed and Reviewed  Exercise Activities and Dietary recommendations  Goals      Exercise 3x per  week (30 min per time)     Recommend to start exercising three times a week for 30 minutes.         Immunization History  Administered Date(s) Administered   Influenza, High Dose Seasonal PF 03/02/2015, 03/07/2016, 02/23/2018   Influenza-Unspecified 03/09/2017, 02/13/2019, 02/15/2020, 02/20/2021   PFIZER(Purple Top)SARS-COV-2 Vaccination 07/02/2019, 07/23/2019, 02/15/2020   Pneumococcal Conjugate-13 02/26/2014   Pneumococcal Polysaccharide-23 02/25/2013   Td 03/20/2001   Tdap 02/14/2011   Zoster Recombinat (Shingrix) 08/31/2017, 11/08/2017   Zoster, Live 06/19/2007    Health Maintenance  Topic Date Due   FOOT EXAM  04/03/2018   COVID-19 Vaccine (4 - Booster for Pfizer series) 04/11/2020   TETANUS/TDAP  02/13/2021   HEMOGLOBIN A1C  06/03/2021   OPHTHALMOLOGY EXAM  01/12/2022   COLONOSCOPY (Pts 45-21yrs Insurance coverage will need to be confirmed)  04/05/2023   Pneumonia Vaccine 93+ Years old  Completed   INFLUENZA VACCINE  Completed   Zoster Vaccines- Shingrix  Completed   Hepatitis C Screening  Addressed   HPV VACCINES  Aged Out     Discussed health benefits of physical activity, and encouraged him to engage in regular exercise appropriate for his age and condition.   He states he has had flu vaccine and bivalent covid vaccine.    1. Prostate cancer screening  - PSA Total (Reflex To Free) ([facility] only)  2. Prescription for Tdap. Vaccine not administered in office.   - Tdap ([place]) 5-2.5-18.5 LF-MCG/0.5 injection; Inject 0.5 mLs into the muscle once for 1 dose.  Dispense: 0.5 mL; Refill: 0  3. Aortic atherosclerosis ([place]) He is tolerating pravastatin well with no adverse effects.    4. Essential (primary) hypertension with Continue current medications.   - CBC - EKG 12-Lead  5. Type 2 diabetes mellitus without complication, without long-term current use of insulin (HCC) Doing well current medications.  - Hemoglobin A1c  6. Hyperlipidemia, unspecified  hyperlipidemia type He is tolerating pravastatin well with no adverse effects.   - Comprehensive metabolic panel - Lipid panel  7. Hypokalemia  - Magnesium  8. Gastroesophageal reflux disease, unspecified whether esophagitis present Well controlled on current dose of omeprazole.   9. Insomnia Doing better with additon of amitriptyline, although he can't tell that it's helped with intermittent abdominal discomfort as per HPI. Will  continue current dose of amitriptyline for now.   Refill amitriptyline (ELAVIL) 25 MG tablet; Take 1 tablet (25 mg total) by mouth at bedtime as needed for sleep.  Dispense: 90 tablet; Refill: 1   Future Appointments  Date Time Provider [place]  11/02/2021  9:40 AM Caryn Section, Kirstie Peri, MD [facility]        The entirety of the information documented in the History of Present Illness, Review of Systems and Physical Exam were personally obtained by me. Portions of this information were initially documented by the CMA and reviewed by me for thoroughness and accuracy.     Lelon Huh, MD  [facility] [phone] (phone) [phone] (fax)  [place]

## 2021-05-04 LAB — COMPREHENSIVE METABOLIC PANEL
ALT: 15 IU/L (ref 0–44)
AST: 16 IU/L (ref 0–40)
Albumin/Globulin Ratio: 1.8 (ref 1.2–2.2)
Albumin: 4.4 g/dL (ref 3.7–4.7)
Alkaline Phosphatase: 61 IU/L (ref 44–121)
BUN/Creatinine Ratio: 16 (ref 10–24)
BUN: 16 mg/dL (ref 8–27)
Bilirubin Total: 0.6 mg/dL (ref 0.0–1.2)
CO2: 26 mmol/L (ref 20–29)
Calcium: 9.6 mg/dL (ref 8.6–10.2)
Chloride: 101 mmol/L (ref 96–106)
Creatinine, Ser: 0.98 mg/dL (ref 0.76–1.27)
Globulin, Total: 2.4 g/dL (ref 1.5–4.5)
Glucose: 148 mg/dL — ABNORMAL HIGH (ref 70–99)
Potassium: 3.7 mmol/L (ref 3.5–5.2)
Sodium: 144 mmol/L (ref 134–144)
Total Protein: 6.8 g/dL (ref 6.0–8.5)
eGFR: 81 mL/min/{1.73_m2} (ref >59)

## 2021-05-04 LAB — LIPID PANEL
Chol/HDL Ratio: 2.7 ratio (ref 0.0–5.0)
Cholesterol, Total: 157 mg/dL (ref 100–199)
HDL: 59 mg/dL (ref >39)
LDL Chol Calc (NIH): 82 mg/dL (ref 0–99)
Triglycerides: 82 mg/dL (ref 0–149)
VLDL Cholesterol Cal: 16 mg/dL (ref 5–40)

## 2021-05-04 LAB — CBC
Hematocrit: 45.9 % (ref 37.5–51.0)
Hemoglobin: 15.3 g/dL (ref 13.0–17.7)
MCH: 29.7 pg (ref 26.6–33.0)
MCHC: 33.3 g/dL (ref 31.5–35.7)
MCV: 89 fL (ref 79–97)
Platelets: 250 10*3/uL (ref 150–450)
RBC: 5.16 x10E6/uL (ref 4.14–5.80)
RDW: 12.6 % (ref 11.6–15.4)
WBC: 6.2 10*3/uL (ref 3.4–10.8)

## 2021-05-04 LAB — PSA TOTAL (REFLEX TO FREE): Prostate Specific Ag, Serum: 1.9 ng/mL (ref 0.0–4.0)

## 2021-05-04 LAB — HEMOGLOBIN A1C
Est. average glucose Bld gHb Est-mCnc: 137 mg/dL
Hgb A1c MFr Bld: 6.4 % — ABNORMAL HIGH (ref 4.8–5.6)

## 2021-05-04 LAB — MAGNESIUM: Magnesium: 1.9 mg/dL (ref 1.6–2.3)

## 2021-05-26 ENCOUNTER — Other Ambulatory Visit: Payer: Self-pay | Admitting: Family Medicine

## 2021-05-26 DIAGNOSIS — I10 Essential (primary) hypertension: Secondary | ICD-10-CM

## 2021-05-26 DIAGNOSIS — K219 Gastro-esophageal reflux disease without esophagitis: Secondary | ICD-10-CM

## 2021-05-26 DIAGNOSIS — E785 Hyperlipidemia, unspecified: Secondary | ICD-10-CM

## 2021-06-15 DIAGNOSIS — Z45321 Encounter for adjustment and management of cochlear device: Secondary | ICD-10-CM | POA: Diagnosis not present

## 2021-06-15 DIAGNOSIS — Z974 Presence of external hearing-aid: Secondary | ICD-10-CM | POA: Diagnosis not present

## 2021-06-15 DIAGNOSIS — H903 Sensorineural hearing loss, bilateral: Secondary | ICD-10-CM | POA: Diagnosis not present

## 2021-06-15 DIAGNOSIS — Z9621 Cochlear implant status: Secondary | ICD-10-CM | POA: Diagnosis not present

## 2021-07-27 ENCOUNTER — Other Ambulatory Visit: Payer: Self-pay | Admitting: Family Medicine

## 2021-08-12 DIAGNOSIS — Z4889 Encounter for other specified surgical aftercare: Secondary | ICD-10-CM | POA: Diagnosis not present

## 2021-08-12 DIAGNOSIS — Z9621 Cochlear implant status: Secondary | ICD-10-CM | POA: Diagnosis not present

## 2021-08-12 DIAGNOSIS — H903 Sensorineural hearing loss, bilateral: Secondary | ICD-10-CM | POA: Diagnosis not present

## 2021-08-12 DIAGNOSIS — Z6830 Body mass index (BMI) 30.0-30.9, adult: Secondary | ICD-10-CM | POA: Diagnosis not present

## 2021-08-24 DIAGNOSIS — H903 Sensorineural hearing loss, bilateral: Secondary | ICD-10-CM | POA: Diagnosis not present

## 2021-09-01 ENCOUNTER — Encounter: Payer: Self-pay | Admitting: Emergency Medicine

## 2021-09-01 ENCOUNTER — Other Ambulatory Visit: Payer: Self-pay

## 2021-09-01 ENCOUNTER — Emergency Department
Admission: EM | Admit: 2021-09-01 | Discharge: 2021-09-01 | Disposition: A | Discharge status: Home / Self Care | Payer: Medicare Other | Attending: Emergency Medicine | Admitting: Emergency Medicine

## 2021-09-01 DIAGNOSIS — Z7984 Long term (current) use of oral hypoglycemic drugs: Secondary | ICD-10-CM | POA: Insufficient documentation

## 2021-09-01 DIAGNOSIS — I10 Essential (primary) hypertension: Secondary | ICD-10-CM | POA: Insufficient documentation

## 2021-09-01 DIAGNOSIS — T1501XA Foreign body in cornea, right eye, initial encounter: Secondary | ICD-10-CM | POA: Diagnosis not present

## 2021-09-01 DIAGNOSIS — E119 Type 2 diabetes mellitus without complications: Secondary | ICD-10-CM | POA: Diagnosis not present

## 2021-09-01 DIAGNOSIS — W228XXA Striking against or struck by other objects, initial encounter: Secondary | ICD-10-CM | POA: Diagnosis not present

## 2021-09-01 DIAGNOSIS — Z79899 Other long term (current) drug therapy: Secondary | ICD-10-CM | POA: Diagnosis not present

## 2021-09-01 DIAGNOSIS — S0501XA Injury of conjunctiva and corneal abrasion without foreign body, right eye, initial encounter: Secondary | ICD-10-CM | POA: Diagnosis not present

## 2021-09-01 DIAGNOSIS — T1591XA Foreign body on external eye, part unspecified, right eye, initial encounter: Secondary | ICD-10-CM

## 2021-09-01 DIAGNOSIS — Z7982 Long term (current) use of aspirin: Secondary | ICD-10-CM | POA: Insufficient documentation

## 2021-09-01 MED ORDER — TOBRAMYCIN 0.3 % OP SOLN
2.0000 [drp] | Freq: Once | OPHTHALMIC | Status: AC
  Administered 2021-09-01: 2 [drp] via OPHTHALMIC
  Filled 2021-09-01: qty 5

## 2021-09-01 MED ORDER — TETRACAINE HCL 0.5 % OP SOLN
2.0000 [drp] | Freq: Once | OPHTHALMIC | Status: AC
  Administered 2021-09-01: 2 [drp] via OPHTHALMIC
  Filled 2021-09-01: qty 4

## 2021-09-01 MED ORDER — FLUORESCEIN SODIUM 1 MG OP STRP
1.0000 | ORAL_STRIP | Freq: Once | OPHTHALMIC | Status: AC
  Administered 2021-09-01: 1 via OPHTHALMIC
  Filled 2021-09-01: qty 1

## 2021-09-01 MED ORDER — FLUORESCEIN SODIUM 1 MG OP STRP: 1.0000 | ORAL_STRIP | Freq: Once | OPHTHALMIC | Status: DC

## 2021-09-01 MED ORDER — TETRACAINE HCL 0.5 % OP SOLN: 2.0000 [drp] | Freq: Once | OPHTHALMIC | Status: DC

## 2021-09-01 NOTE — Discharge Instructions (Signed)
Apply antibiotic eyedrops 2 drops right eye every 4 hours while awake.  Return to the ER for worsening symptoms, persistent vomiting or other concerns. ?

## 2021-09-01 NOTE — ED Triage Notes (Signed)
Patient ambulatory to triage with steady gait, without difficulty or distress noted; pt reports yesterday he was cutting metal and a piece went into rt eye ?

## 2021-09-01 NOTE — ED Provider Notes (Signed)
? ?[facility] ?Provider Note ? ? ? Event Date/Time  ? First MD Initiated Contact with Patient 09/01/21 0032   ?  (approximate) ? ? ?History  ? ?Foreign Body in [place] ? ? ?HPI ? ?Adrian Thompson is a 76 y.o. male who presents to the ED from home with a chief complaint of foreign body to right eye.  Patient was cutting metal yesterday while wearing his glasses and feels like a piece of metal went into his eye.  Feels like it is underneath the right eyelid.  Presents with irritation and redness to his right eye.  Denies blurry vision, severe pain, nausea/vomiting or dizziness. ?  ? ? ?Past Medical History  ? ?Past Medical History:  ?Diagnosis Date  ?? Diabetes mellitus without complication ([place])   ? type 2  ?? Gout   ?? History of chicken pox   ?? History of measles   ?? History of mumps   ?? Hyperlipidemia   ?? Hypertension   ? ? ? ?Active Problem List  ? ?Patient Active Problem List  ? Diagnosis Date Noted  ?? Aortic atherosclerosis ([place]) 01/12/2021  ?? Chronic left shoulder pain 04/13/2020  ?? Frequent PVCs 04/03/2018  ?? Hypokalemia 07/18/2016  ?? Cochlear implant in place 06/28/2016  ?? GERD (gastroesophageal reflux disease) 02/26/2015  ?? Hearing loss 02/26/2015  ?? HLD (hyperlipidemia) 02/26/2015  ?? Benign prostatic hyperplasia with urinary obstruction 09/04/2012  ?? Chronic prostatitis 06/04/2012  ?? Allergic rhinitis 02/08/2009  ?? Diverticulosis of colon without hemorrhage 02/26/2008  ?? Diabetes mellitus ([place]) 02/04/2008  ?? Gout 02/01/2007  ?? Essential (primary) hypertension 02/01/2007  ?? Irritable bowel syndrome 05/23/1998  ? ? ? ?Past Surgical History  ? ?Past Surgical History:  ?Procedure Laterality Date  ?? COCHLEAR IMPLANT Right 06/28/2016  ? Vira Blanco, MD [facility]  ?? COLONOSCOPY  2009  ?? COLONOSCOPY WITH PROPOFOL N/A 04/04/2018  ? Procedure: COLONOSCOPY WITH PROPOFOL;  Surgeon: Robert Bellow, MD;  Location: [facility] ENDOSCOPY;  Service: Endoscopy;  Laterality: N/A;  ??  EXTERNAL EAR SURGERY  2018  ?? IR RADIOLOGIST EVAL & MGMT  11/11/2020  ?? IR RADIOLOGIST EVAL & MGMT  12/30/2020  ?? TONSILLECTOMY    ?? UPPER GI ENDOSCOPY  2009  ?? UPPER GI ENDOSCOPY  03/04/2011  ? [place]; Excision of multiple gastric polyps  ? ? ? ?Home Medications  ? ?Prior to Admission medications   ?Medication Sig Start Date End Date Taking? Authorizing Provider  ?ACCU-CHEK AVIVA PLUS test strip USE ONCE EVERY DAY AS DIRECTED 07/27/21   Birdie Sons, MD  ?amitriptyline (ELAVIL) 25 MG tablet Take 1 tablet (25 mg total) by mouth at bedtime as needed for sleep. 05/03/21   Birdie Sons, MD  ?amLODipine (NORVASC) 10 MG tablet TAKE 1 TABLET EVERY DAY 12/03/20   Birdie Sons, MD  ?aspirin 81 MG tablet Take 1 tablet by mouth daily. 11/10/11   [provider]  ?atenolol (TENORMIN) 50 MG tablet TAKE 1 TABLET EVERY DAY 05/26/21   Birdie Sons, MD  ?finasteride (PROSCAR) 5 MG tablet TAKE 1 TABLET EVERY DAY 12/03/20   Birdie Sons, MD  ?glucosamine-chondroitin 500-400 MG tablet Take 1 tablet by mouth daily. 02/07/07   [provider]  ?HYDROcodone-acetaminophen (NORCO/VICODIN) 5-325 MG tablet Take 1 tablet by mouth every 8 (eight) hours as needed for moderate pain or severe pain. 10/21/20   Coral Spikes, DO  ?indomethacin (INDOCIN) 25 MG capsule Take 1 capsule (25 mg total) by  mouth 3 (three) times daily with meals as needed. Up to 3 times a day 01/08/18   Birdie Sons, MD  ?losartan-hydrochlorothiazide [facility]) 100-25 MG tablet TAKE 1 TABLET EVERY DAY 03/10/21   Birdie Sons, MD  ?meloxicam (MOBIC) 15 MG tablet Take 1 tablet (15 mg total) by mouth daily as needed for pain. As needed 04/13/20   Birdie Sons, MD  ?Misc Natural Products (BLACK CHERRY CONCENTRATE PO) Take 1 tablet by mouth 2 (two) times daily.     [provider]  ?montelukast (SINGULAIR) 10 MG tablet TAKE 1 TABLET EVERY DAY 03/21/19   Birdie Sons, MD  ?Multiple Vitamins-Minerals (MULTIVITAMIN ADULT PO) Take 1  tablet by mouth daily. 09/25/08   [provider]  ?Omega-3 Fatty Acids (FISH OIL) 1000 MG CPDR Take 1 capsule by mouth daily. 11/10/11   [provider]  ?omeprazole (PRILOSEC) 20 MG capsule TAKE 1 CAPSULE EVERY DAY 05/26/21   Birdie Sons, MD  ?potassium chloride SA (KLOR-CON) 20 MEQ tablet TAKE 1 TABLET EVERY DAY 12/03/20   Birdie Sons, MD  ?pravastatin (PRAVACHOL) 20 MG tablet TAKE 1 TABLET EVERY DAY 05/26/21   Birdie Sons, MD  ?tamsulosin (FLOMAX) 0.4 MG CAPS capsule TAKE 1 CAPSULE (0.4 MG TOTAL) BY MOUTH DAILY. 09/17/20   Birdie Sons, MD  ?Triamcinolone Acetonide (NASACORT AQ NA) Place into the nose daily.    [provider]  ? ? ? ?Allergies  ?Patient has no known allergies. ? ? ?Family History  ? ?Family History  ?Problem Relation Age of Onset  ?? Hypertension Mother   ?? Hypertension Sister   ? ? ? ?Physical Exam  ?Triage Vital Signs: ?ED Triage Vitals [09/01/21 0029]  ?Enc Vitals Group  ?   BP   ?   Pulse   ?   Resp   ?   Temp   ?   Temp src   ?   SpO2   ?   Weight 220 lb (99.8 kg)  ?   Height '6\' 1"'$  (1.854 m)  ?   Head Circumference   ?   Peak Flow   ?   Pain Score 5  ?   Pain Loc   ?   Pain Edu?   ?   Excl. in [place]?   ? ? ?Updated Vital Signs: ?BP (!) 181/96   Pulse 65   Temp 98.1 ?F (36.7 ?C) (Oral)   Resp 16   Ht '6\' 1"'$  (1.854 m)   Wt 99.8 kg   SpO2 96%   BMI 29.03 kg/m?  ? ? ?General: Awake, no distress.  ?CV:  Good peripheral perfusion.  ?Resp:  Normal effort.  ?Abd:  No distention.  ?Other:  Visual acuity noted 20/40 both eyes.  Right eye: Globe is intact.  PERRL.  EOMI.  Injected conjunctiva.  Right eyelid everted without foreign body seen.  Tetracaine drops applied to right eye and stained with fluorescein strips and examined with [place] lamp.  There is a central tiny uptake of dye.  Unsuccessful removal with Q-tip. ? ? ?ED Results / Procedures / Treatments  ?Labs ?(all labs ordered are listed, but only abnormal results are displayed) ?Labs Reviewed - No data  to display ? ? ?EKG ? ?None ? ? ?RADIOLOGY ?None ? ? ?Official radiology report(s): ?No results found. ? ? ?PROCEDURES: ? ?Critical Care performed: No ? ?Procedures ? ? ?MEDICATIONS ORDERED IN ED: ?Medications  ?tobramycin (TOBREX) 0.3 % ophthalmic solution 2 drop (  has no administration in time range)  ?fluorescein ophthalmic strip 1 strip (1 strip Right Eye Given by Other 09/01/21 0045)  ?tetracaine (PONTOCAINE) 0.5 % ophthalmic solution 2 drop (2 drops Right Eye Given by Other 09/01/21 0045)  ? ? ? ?IMPRESSION / MDM / ASSESSMENT AND PLAN / ED COURSE  ?I reviewed the triage vital signs and the nursing notes. ?             ?               ?76 year old male presenting with foreign body to right eye.  Pain controlled with tetracaine drops alone.  Unsuccessful removal with Q-tip.  Will irrigate with Lilia Pro lens and reassess. ? ? ? ?Clinical Course as of 09/01/21 0243  ?Wed Sep 01, 2021  ?0105 Patient tolerated irrigation with Lilia Pro lens.  Reexamined, FB appears to remain.  Patient will call ophthalmology in the morning to be seen in the office.  Strict return precautions given.  Patient and spouse verbalized understanding and agree with plan of care. [JS]  ?  ?Clinical Course User Index ?[JS] Paulette Blanch, MD  ? ? ? ?FINAL CLINICAL IMPRESSION(S) / ED DIAGNOSES  ? ?Final diagnoses:  ?Eye foreign body, right, initial encounter  ? ? ? ?Rx / DC Orders  ? ?ED Discharge Orders   ? ? None  ? ?  ? ? ? ?Note:  This document was prepared using Dragon voice recognition software and may include unintentional dictation errors. ?  ?Paulette Blanch, MD ?09/01/21 0503 ? ?

## 2021-09-01 NOTE — ED Notes (Signed)
1L NS irrigation complete at this time. ED provider at bedside.  ?

## 2021-09-02 DIAGNOSIS — Z0110 Encounter for examination of ears and hearing without abnormal findings: Secondary | ICD-10-CM | POA: Diagnosis not present

## 2021-09-02 DIAGNOSIS — H903 Sensorineural hearing loss, bilateral: Secondary | ICD-10-CM | POA: Diagnosis not present

## 2021-09-20 ENCOUNTER — Other Ambulatory Visit: Payer: Self-pay | Admitting: Family Medicine

## 2021-09-20 DIAGNOSIS — G4700 Insomnia, unspecified: Secondary | ICD-10-CM

## 2021-11-01 NOTE — Progress Notes (Signed)
I,Jana Robinson,acting as a scribe for Lelon Huh, MD.,have documented all relevant documentation on the behalf of Lelon Huh, MD,as directed by  Lelon Huh, MD while in the presence of Lelon Huh, MD.   Established patient visit   Patient: Adrian Thompson   DOB: August 15, 1945   75 y.o. Male  MRN: [id number] Visit Date: 11/02/2021  Today's healthcare provider: Lelon Huh, MD   Chief Complaint  Patient presents with   Diabetes   Hypertension   Hyperlipidemia   Subjective    Diabetes Mellitus Type II, follow-up  Lab Results  Component Value Date   HGBA1C 6.4 (H) 05/03/2021   HGBA1C 6.6 (A) 12/01/2020   HGBA1C 6.4 (A) 08/12/2020   Last seen for diabetes 6 months ago.  Management since then includes continuing the same treatment. He reports excellent compliance with treatment. He is not having side effects.  Home blood sugar records: 115 to 140-150  Episodes of hypoglycemia? No    Current insulin regiment:none  Most Recent Eye Exam: 01-12-21  --------------------------------------------------------------------------------------------------- Hypertension, follow-up  BP Readings from Last 3 Encounters:  11/02/21 140/87  09/01/21 (!) 181/96  05/03/21 138/80   Wt Readings from Last 3 Encounters:  11/02/21 223 lb 11.2 oz (101.5 kg)  09/01/21 220 lb (99.8 kg)  05/03/21 224 lb (101.6 kg)     He was last seen for hypertension 6 months ago.  BP at that visit was 138/80 . Management since that visit includes continue current medication. He reports excellent compliance with treatment. He is not having side effects. He is not exercising. Gardening daily He is not adherent to low salt diet.   Outside blood pressures are   He does not smoke.  Use of agents associated with hypertension: none.   --------------------------------------------------------------------------------------------------- Lipid/Cholesterol, follow-up  Last Lipid Panel: Lab Results   Component Value Date   CHOL 157 05/03/2021   LDLCALC 82 05/03/2021   HDL 59 05/03/2021   TRIG 82 05/03/2021    He was last seen for this 6 months ago.  Management since that visit includes continue current medication.  He reports excellent compliance with treatment. He is not having side effects.  Symptoms: No appetite changes No foot ulcerations  No chest pain No chest pressure/discomfort  No dyspnea No orthopnea  No fatigue No lower extremity edema  No palpitations No paroxysmal nocturnal dyspnea  No nausea No numbness or tingling of extremity  No polydipsia No polyuria  No speech difficulty No syncope   He is following a Regular diet. Current exercise: gardening  Last metabolic panel Lab Results  Component Value Date   GLUCOSE 148 (H) 05/03/2021   NA 144 05/03/2021   K 3.7 05/03/2021   BUN 16 05/03/2021   CREATININE 0.98 05/03/2021   EGFR 81 05/03/2021   GFRNONAA >60 10/15/2020   CALCIUM 9.6 05/03/2021   AST 16 05/03/2021   ALT 15 05/03/2021   The 10-year ASCVD risk score (Arnett DK, et al., 2019) is: 45.4%  ---------------------------------------------------------------------------------------------------   Medications: Outpatient Medications Prior to Visit  Medication Sig   ACCU-CHEK AVIVA PLUS test strip USE ONCE EVERY DAY AS DIRECTED   amitriptyline (ELAVIL) 25 MG tablet TAKE 1 TABLET AT BEDTIME AS NEEDED FOR SLEEP   amLODipine (NORVASC) 10 MG tablet TAKE 1 TABLET EVERY DAY   aspirin 81 MG tablet Take 1 tablet by mouth daily.   atenolol (TENORMIN) 50 MG tablet TAKE 1 TABLET EVERY DAY   finasteride (PROSCAR) 5 MG tablet TAKE 1 TABLET  EVERY DAY   glucosamine-chondroitin 500-400 MG tablet Take 1 tablet by mouth daily.   indomethacin (INDOCIN) 25 MG capsule Take 1 capsule (25 mg total) by mouth 3 (three) times daily with meals as needed. Up to 3 times a day   losartan-hydrochlorothiazide (HYZAAR) 100-25 MG tablet TAKE 1 TABLET EVERY DAY   meloxicam (MOBIC)  15 MG tablet Take 1 tablet (15 mg total) by mouth daily as needed for pain. As needed   Misc Natural Products (BLACK CHERRY CONCENTRATE PO) Take 1 tablet by mouth 2 (two) times daily.    montelukast (SINGULAIR) 10 MG tablet TAKE 1 TABLET EVERY DAY   Multiple Vitamins-Minerals (MULTIVITAMIN ADULT PO) Take 1 tablet by mouth daily.   Omega-3 Fatty Acids (FISH OIL) 1000 MG CPDR Take 1 capsule by mouth daily.   omeprazole (PRILOSEC) 20 MG capsule TAKE 1 CAPSULE EVERY DAY   potassium chloride SA (KLOR-CON) 20 MEQ tablet TAKE 1 TABLET EVERY DAY   pravastatin (PRAVACHOL) 20 MG tablet TAKE 1 TABLET EVERY DAY   tamsulosin (FLOMAX) 0.4 MG CAPS capsule TAKE 1 CAPSULE EVERY DAY   Triamcinolone Acetonide (NASACORT AQ NA) Place into the nose daily.   HYDROcodone-acetaminophen (NORCO/VICODIN) 5-325 MG tablet Take 1 tablet by mouth every 8 (eight) hours as needed for moderate pain or severe pain. (Patient not taking: Reported on 11/02/2021)   No facility-administered medications prior to visit.    Review of Systems  Constitutional:  Negative for appetite change, chills and fever.  Respiratory:  Negative for chest tightness, shortness of breath and wheezing.   Cardiovascular:  Negative for chest pain and palpitations.  Gastrointestinal:  Negative for abdominal pain, nausea and vomiting.       Objective    BP 140/87 (BP Location: Right Arm, Patient Position: Sitting, Cuff Size: Normal)   Pulse 64   Temp 97.9 F (36.6 C) (Oral)   Resp 16   Wt 223 lb 11.2 oz (101.5 kg)   SpO2 99%   BMI 29.51 kg/m    Physical Exam   General appearance:  Well developed, well nourished male, cooperative and in no acute distress Head: Normocephalic, without obvious abnormality, atraumatic Respiratory: Respirations even and unlabored, normal respiratory rate Extremities: All extremities are intact.  Skin: Skin color, texture, turgor normal. No rashes seen  Psych: Appropriate mood and affect. Neurologic: Mental  status: Alert, oriented to person, place, and time, thought content appropriate.    Results for orders placed or performed in visit on 11/02/21  POCT glycosylated hemoglobin (Hb A1C)  Result Value Ref Range   Hemoglobin A1C 6.8 (A) 4.0 - 5.6 %   Est. average glucose Bld gHb Est-mCnc 148      Assessment & Plan     1. Type 2 diabetes mellitus without complication, without long-term current use of insulin (HCC) Diet controlled. A1c up a bit. He admits to consuming too many sweets and is going to work on cutting back.  - Urine microalbumin-creatinine with uACR  2. Essential (primary) hypertension SBP above goal, but he reports home systolics usually around 409. Continue current medications.  Consider changing losartan to valsartan if not improving.   3. Aortic atherosclerosis ([place]) He is tolerating pravastatin well with no adverse effects.    4. Chronic left shoulder pain He states meloxicam is effective and takes one every week or so. Advised he could take it a few time per week if his kidney functions are stable.   Future Appointments  Date Time Provider [place]  02/02/2022  10:00 AM Birdie Sons, MD [id number]         The entirety of the information documented in the History of Present Illness, Review of Systems and Physical Exam were personally obtained by me. Portions of this information were initially documented by the CMA and reviewed by me for thoroughness and accuracy.     Lelon Huh, MD  [facility] [phone] (phone) [phone] (fax)  [place]

## 2021-11-02 ENCOUNTER — Ambulatory Visit (INDEPENDENT_AMBULATORY_CARE_PROVIDER_SITE_OTHER): Payer: Medicare Other | Admitting: Family Medicine

## 2021-11-02 ENCOUNTER — Encounter: Payer: Self-pay | Admitting: Family Medicine

## 2021-11-02 VITALS — BP 140/87 | HR 64 | Temp 97.9°F | Resp 16 | Wt 223.7 lb

## 2021-11-02 DIAGNOSIS — G8929 Other chronic pain: Secondary | ICD-10-CM | POA: Diagnosis not present

## 2021-11-02 DIAGNOSIS — I700 Atherosclerosis of aorta: Secondary | ICD-10-CM

## 2021-11-02 DIAGNOSIS — E119 Type 2 diabetes mellitus without complications: Secondary | ICD-10-CM | POA: Diagnosis not present

## 2021-11-02 DIAGNOSIS — I10 Essential (primary) hypertension: Secondary | ICD-10-CM | POA: Diagnosis not present

## 2021-11-02 DIAGNOSIS — M25512 Pain in left shoulder: Secondary | ICD-10-CM

## 2021-11-02 LAB — POCT GLYCOSYLATED HEMOGLOBIN (HGB A1C)
Est. average glucose Bld gHb Est-mCnc: 148
Hemoglobin A1C: 6.8 % — AB (ref 4.0–5.6)

## 2021-11-02 MED ORDER — INDOMETHACIN 25 MG PO CAPS: 25.0000 mg | ORAL_CAPSULE | Freq: Three times a day (TID) | ORAL | 0 refills | Status: AC | PRN

## 2021-11-03 LAB — MICROALBUMIN / CREATININE URINE RATIO
Creatinine, Urine: 206.6 mg/dL
Microalb/Creat Ratio: 11 mg/g creat (ref 0–29)
Microalbumin, Urine: 23 ug/mL

## 2021-12-07 DIAGNOSIS — H903 Sensorineural hearing loss, bilateral: Secondary | ICD-10-CM | POA: Diagnosis not present

## 2021-12-08 DIAGNOSIS — H43813 Vitreous degeneration, bilateral: Secondary | ICD-10-CM | POA: Diagnosis not present

## 2021-12-08 LAB — HM DIABETES EYE EXAM

## 2021-12-12 IMAGING — CT CT ABD-PELV W/O CM
1 of 2 series · 15 of 32 positions shown, 19 images · non-contrast
Comparison: None.

CLINICAL DATA: Acute left upper quadrant abdominal pain.

EXAM:
CT ABDOMEN AND PELVIS WITHOUT CONTRAST
TECHNIQUE: Multidetector CT imaging of the abdomen and pelvis was performed
following the standard protocol without IV contrast.

[Series 2: axial st · axial · 0.97mm/px · z∈[-1067,-587]mm · 15 of 106 slices shown, 19 images]
[im 5/106  soft-tissue]
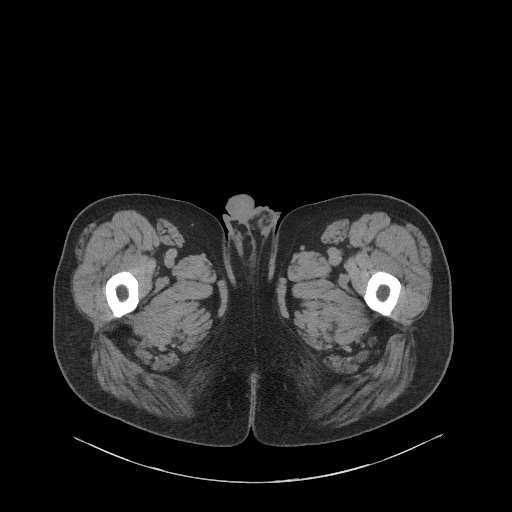
[im 5/106  bone]
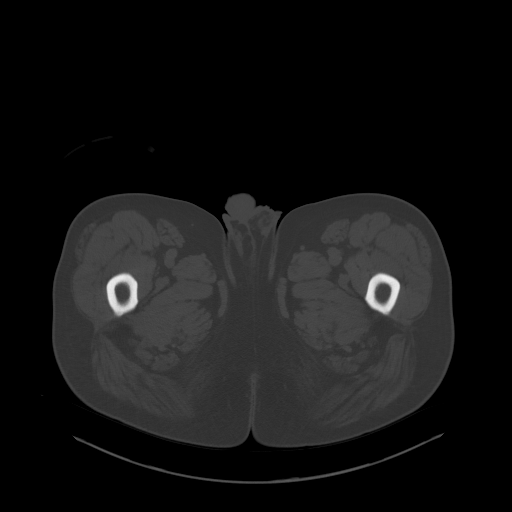
[im 15/106  soft-tissue]
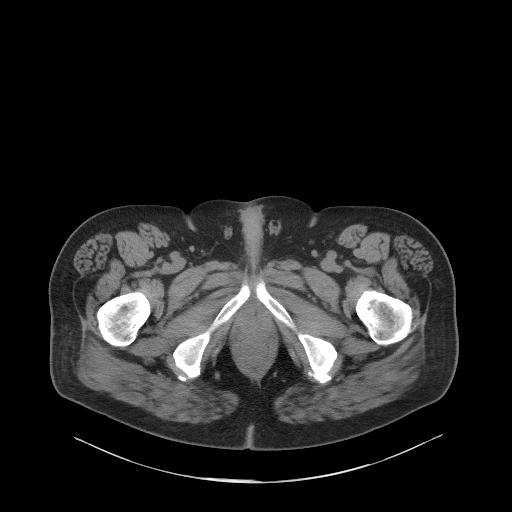
[im 24/106  soft-tissue]
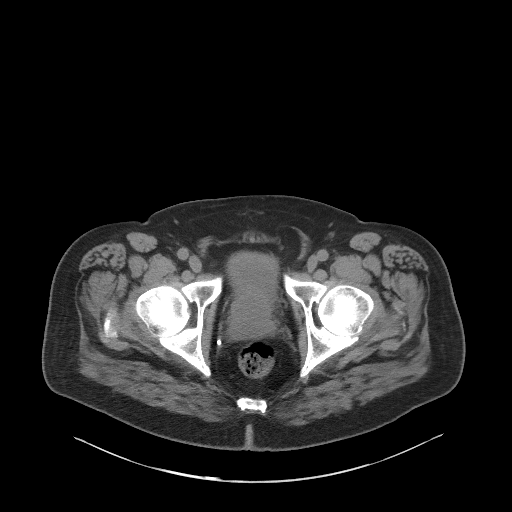
[im 29/106  soft-tissue]
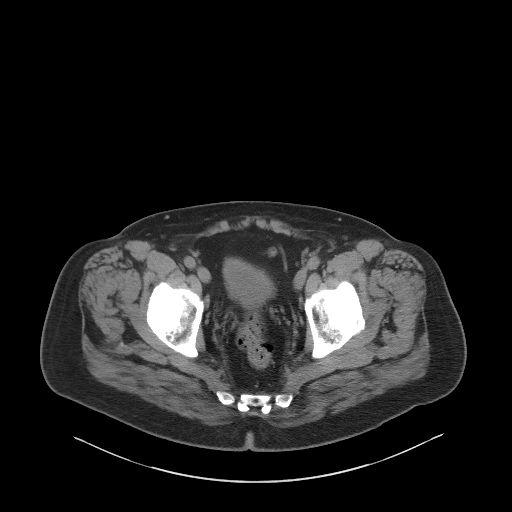
[im 39/106  soft-tissue]
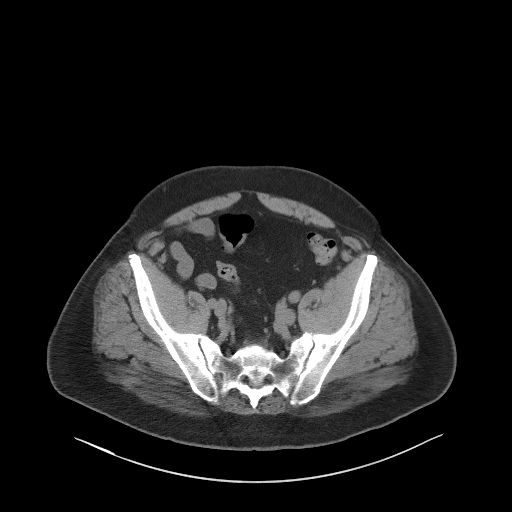
[im 43/106  soft-tissue]
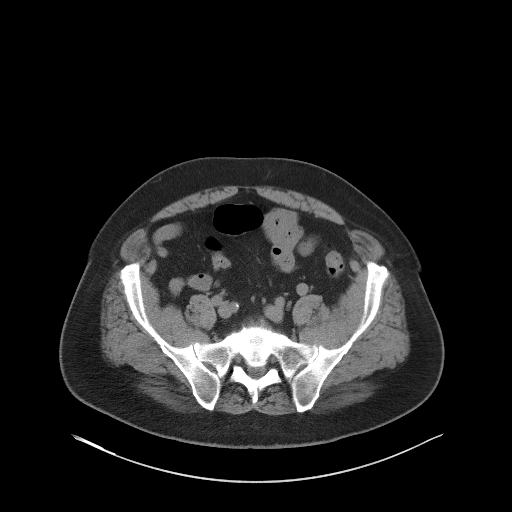
[im 53/106  soft-tissue]
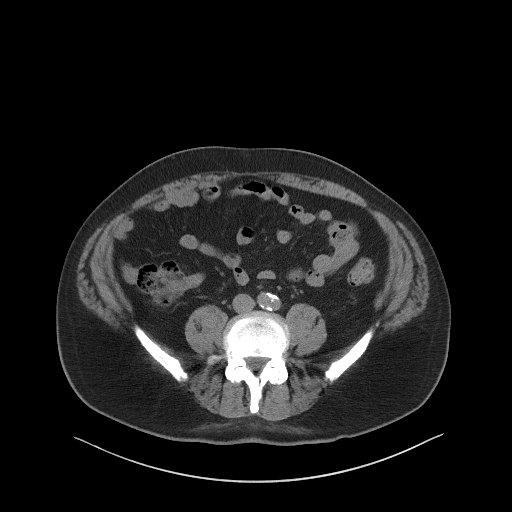
[im 63/106  soft-tissue]
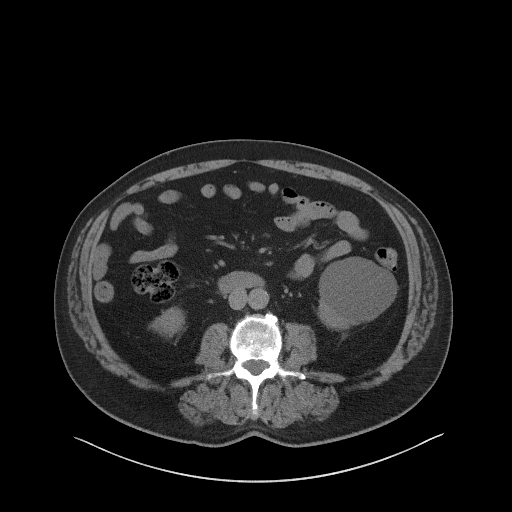
[im 67/106  soft-tissue]
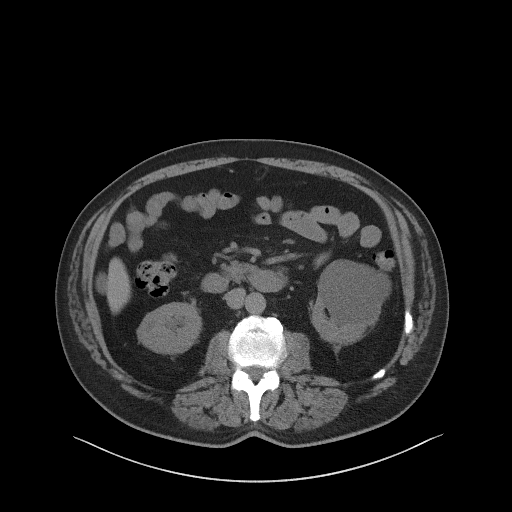
[im 67/106  bone]
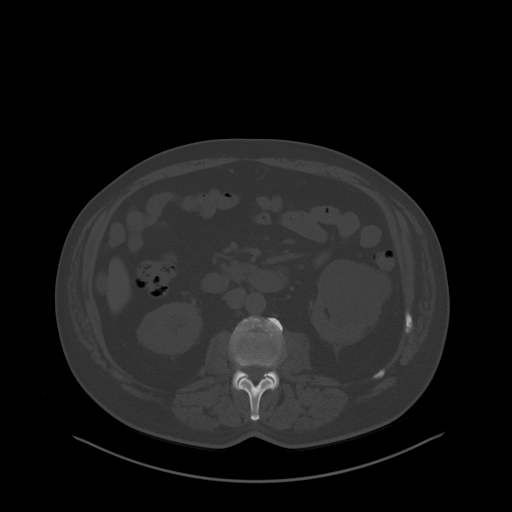
[im 77/106  soft-tissue]
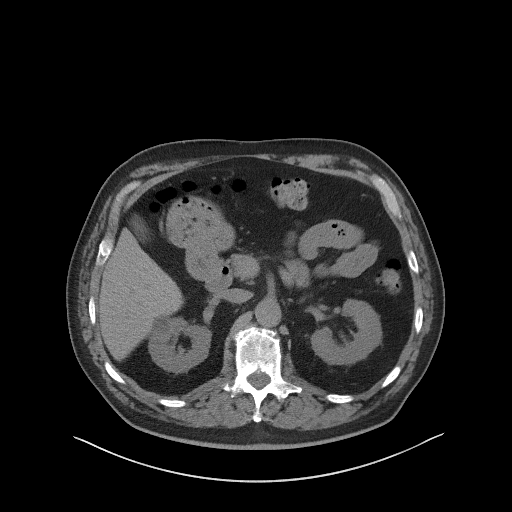
[im 82/106  soft-tissue]
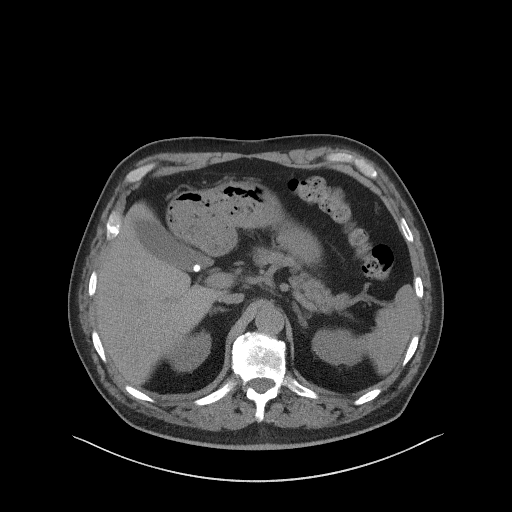
[im 86/106  lung]
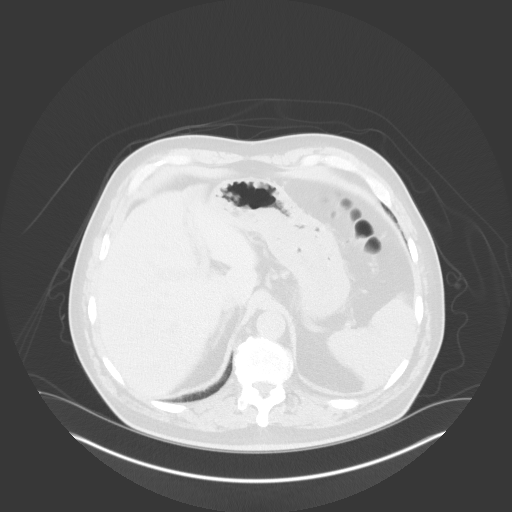
[im 91/106  soft-tissue]
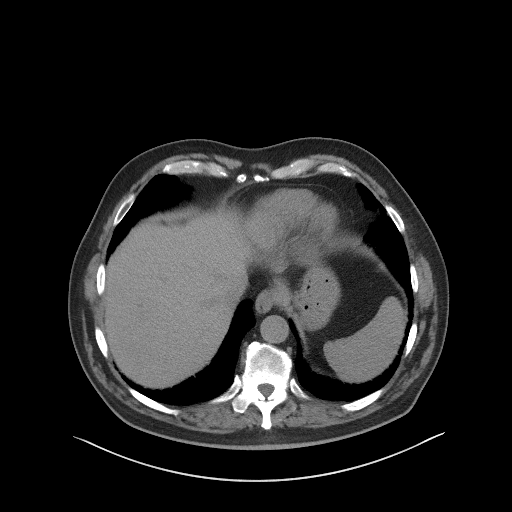
[im 91/106  lung]
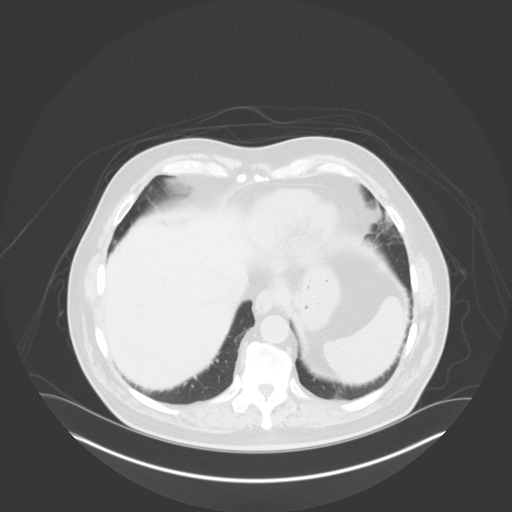
[im 96/106  lung]
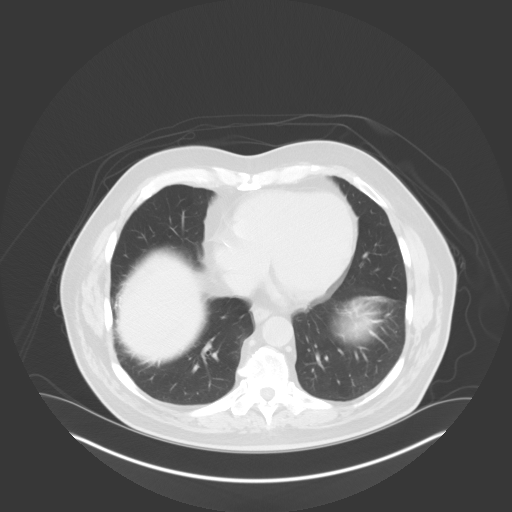
[im 101/106  soft-tissue]
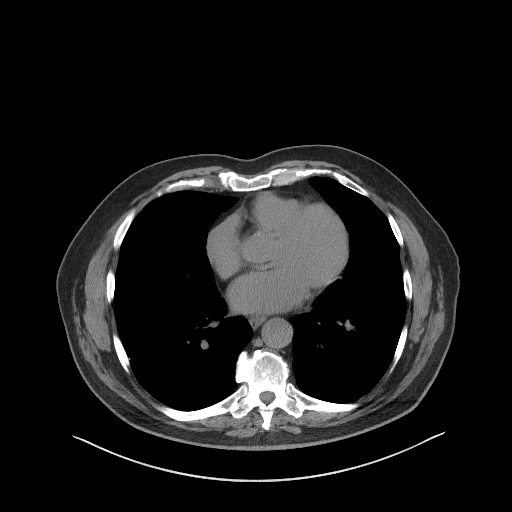
[im 101/106  lung]
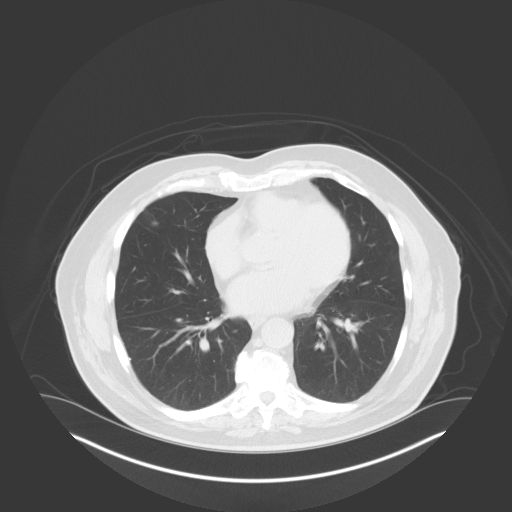

[15 of 32 positions shown; findings below may reference images not displayed]

FINDINGS: Lower chest: 5 mm nodule is noted in right middle lobe.

Hepatobiliary: Small gallstone is noted. No biliary dilatation is
noted. The liver is unremarkable.

Pancreas: Unremarkable. No pancreatic ductal dilatation or
surrounding inflammatory changes.

Spleen: Normal in size without focal abnormality.

Adrenals/Urinary Tract: Adrenal glands appear normal. Bilateral
renal cysts are noted, with the largest measuring 7.9 cm on the
left. No hydronephrosis or renal obstruction is noted. No renal or
ureteral calculi are noted. Urinary bladder is unremarkable.

Stomach/Bowel: Stomach is within normal limits. Appendix appears
normal. No evidence of bowel wall thickening, distention, or
inflammatory changes.

Vascular/Lymphatic: Aortic atherosclerosis. No enlarged abdominal or
pelvic lymph nodes.

Reproductive: Mild prostatic enlargement is noted.

Other: No abdominal wall hernia or abnormality. No abdominopelvic
ascites.

Musculoskeletal: No acute or significant osseous findings.
IMPRESSION: 5 mm nodule seen in right middle lobe. No follow-up needed if
patient is low-risk. Non-contrast chest CT can be considered in 12
months if patient is high-risk. This recommendation follows the
consensus statement: Guidelines for Management of Incidental
Pulmonary Nodules Detected on CT Images: From the [HOSPITAL]

Small gallstone.

Mild prostatic enlargement.

No acute abnormality seen in the abdomen or pelvis.

Aortic Atherosclerosis (019YN-MFZ.Z).

## 2022-01-12 DIAGNOSIS — D2261 Melanocytic nevi of right upper limb, including shoulder: Secondary | ICD-10-CM | POA: Diagnosis not present

## 2022-01-12 DIAGNOSIS — D2271 Melanocytic nevi of right lower limb, including hip: Secondary | ICD-10-CM | POA: Diagnosis not present

## 2022-01-12 DIAGNOSIS — S80861A Insect bite (nonvenomous), right lower leg, initial encounter: Secondary | ICD-10-CM | POA: Diagnosis not present

## 2022-01-12 DIAGNOSIS — S80862A Insect bite (nonvenomous), left lower leg, initial encounter: Secondary | ICD-10-CM | POA: Diagnosis not present

## 2022-01-12 DIAGNOSIS — D2272 Melanocytic nevi of left lower limb, including hip: Secondary | ICD-10-CM | POA: Diagnosis not present

## 2022-01-12 DIAGNOSIS — L814 Other melanin hyperpigmentation: Secondary | ICD-10-CM | POA: Diagnosis not present

## 2022-01-12 DIAGNOSIS — L821 Other seborrheic keratosis: Secondary | ICD-10-CM | POA: Diagnosis not present

## 2022-01-12 DIAGNOSIS — D225 Melanocytic nevi of trunk: Secondary | ICD-10-CM | POA: Diagnosis not present

## 2022-01-12 DIAGNOSIS — X32XXXA Exposure to sunlight, initial encounter: Secondary | ICD-10-CM | POA: Diagnosis not present

## 2022-01-12 DIAGNOSIS — D2262 Melanocytic nevi of left upper limb, including shoulder: Secondary | ICD-10-CM | POA: Diagnosis not present

## 2022-02-02 ENCOUNTER — Encounter: Payer: Self-pay | Admitting: Family Medicine

## 2022-02-02 ENCOUNTER — Ambulatory Visit (INDEPENDENT_AMBULATORY_CARE_PROVIDER_SITE_OTHER): Payer: Medicare Other | Admitting: Family Medicine

## 2022-02-02 VITALS — BP 148/77 | HR 65 | Temp 98.8°F | Resp 20 | Wt 217.0 lb

## 2022-02-02 DIAGNOSIS — E119 Type 2 diabetes mellitus without complications: Secondary | ICD-10-CM | POA: Diagnosis not present

## 2022-02-02 DIAGNOSIS — I10 Essential (primary) hypertension: Secondary | ICD-10-CM

## 2022-02-02 LAB — POCT GLYCOSYLATED HEMOGLOBIN (HGB A1C)
Est. average glucose Bld gHb Est-mCnc: 137
Hemoglobin A1C: 6.4 % — AB (ref 4.0–5.6)

## 2022-02-02 NOTE — Progress Notes (Signed)
I,Roshena L Chambers,acting as a scribe for Lelon Huh, MD.,have documented all relevant documentation on the behalf of Lelon Huh, MD,as directed by  Lelon Huh, MD while in the presence of Lelon Huh, MD.   Established patient visit   Patient: Adrian Thompson   DOB: 1946/03/29   76 y.o. Male  MRN: [id number] Visit Date: 02/02/2022  Today's healthcare provider: Lelon Huh, MD   Chief Complaint  Patient presents with   Hypertension   Diabetes   Sinus Problem   Subjective    HPI  Diabetes Mellitus Type II, Follow-up  Lab Results  Component Value Date   HGBA1C 6.4 (A) 02/02/2022   HGBA1C 6.8 (A) 11/02/2021   HGBA1C 6.4 (H) 05/03/2021   Wt Readings from Last 3 Encounters:  02/02/22 217 lb (98.4 kg)  11/02/21 223 lb 11.2 oz (101.5 kg)  09/01/21 220 lb (99.8 kg)   Last seen for diabetes 3 months ago.  Management since then includes counseling patient to work on improving diet. He reports good compliance with treatment. He is not having side effects.  Symptoms: No fatigue No foot ulcerations  No appetite changes No nausea  No paresthesia of the feet  No polydipsia  No polyuria No visual disturbances   No vomiting     Home blood sugar records: fasting range: 120-140  Episodes of hypoglycemia? No    Current insulin regiment: none; diet controlled Most Recent Eye Exam: 01/12/2021 Current exercise: gardening Current diet habits: in general, an "unhealthy" diet, large amount of fruit and sweets  Pertinent Labs: Lab Results  Component Value Date   CHOL 157 05/03/2021   HDL 59 05/03/2021   LDLCALC 82 05/03/2021   TRIG 82 05/03/2021   CHOLHDL 2.7 05/03/2021   Lab Results  Component Value Date   NA 144 05/03/2021   K 3.7 05/03/2021   CREATININE 0.98 05/03/2021   EGFR 81 05/03/2021   MICROALBUR 50 01/08/2018   LABMICR 23.0 11/02/2021     ---------------------------------------------------------------------------------------------------    Hypertension, follow-up  BP Readings from Last 3 Encounters:  02/02/22 (!) 148/77  11/02/21 140/87  09/01/21 (!) 181/96   Wt Readings from Last 3 Encounters:  02/02/22 217 lb (98.4 kg)  11/02/21 223 lb 11.2 oz (101.5 kg)  09/01/21 220 lb (99.8 kg)     He was last seen for hypertension 3 months ago.  BP at that visit was 140/87. Management since that visit includes continue same medication.  He reports good compliance with treatment. He is not having side effects.  He is following a Regular diet. He is exercising. He does not smoke.  Use of agents associated with hypertension: NSAIDS.   Outside blood pressures are 130/70's.     ---------------------------------------------------------------------------------------------------  Sinus problem: Patient complains of runny nose, stuffy nose, sore throat and sneezing since yesterday. Home COVID test yesterday was negative. He has been taking Singulair to help with symptoms.  Medications: Outpatient Medications Prior to Visit  Medication Sig   ACCU-CHEK AVIVA PLUS test strip USE ONCE EVERY DAY AS DIRECTED   amitriptyline (ELAVIL) 25 MG tablet TAKE 1 TABLET AT BEDTIME AS NEEDED FOR SLEEP   amLODipine (NORVASC) 10 MG tablet TAKE 1 TABLET EVERY DAY   aspirin 81 MG tablet Take 1 tablet by mouth daily.   atenolol (TENORMIN) 50 MG tablet TAKE 1 TABLET EVERY DAY   finasteride (PROSCAR) 5 MG tablet TAKE 1 TABLET EVERY DAY   glucosamine-chondroitin 500-400 MG tablet Take 1 tablet by mouth daily.  indomethacin (INDOCIN) 25 MG capsule Take 1 capsule (25 mg total) by mouth 3 (three) times daily with meals as needed. Up to 3 times a day   losartan-hydrochlorothiazide (HYZAAR) 100-25 MG tablet TAKE 1 TABLET EVERY DAY   meloxicam (MOBIC) 15 MG tablet Take 1 tablet (15 mg total) by mouth daily as needed for pain. As needed   Misc Natural Products (BLACK CHERRY CONCENTRATE PO) Take 1 tablet by mouth 2 (two) times daily.    montelukast  (SINGULAIR) 10 MG tablet TAKE 1 TABLET EVERY DAY   Multiple Vitamins-Minerals (MULTIVITAMIN ADULT PO) Take 1 tablet by mouth daily.   Omega-3 Fatty Acids (FISH OIL) 1000 MG CPDR Take 1 capsule by mouth daily.   omeprazole (PRILOSEC) 20 MG capsule TAKE 1 CAPSULE EVERY DAY   potassium chloride SA (KLOR-CON) 20 MEQ tablet TAKE 1 TABLET EVERY DAY   pravastatin (PRAVACHOL) 20 MG tablet TAKE 1 TABLET EVERY DAY   tamsulosin (FLOMAX) 0.4 MG CAPS capsule TAKE 1 CAPSULE EVERY DAY   Triamcinolone Acetonide (NASACORT AQ NA) Place into the nose daily.   No facility-administered medications prior to visit.    Review of Systems  Constitutional:  Negative for appetite change, chills and fever.  HENT:  Positive for congestion (nasal congestion), rhinorrhea, sneezing and sore throat.   Respiratory:  Negative for chest tightness, shortness of breath and wheezing.   Cardiovascular:  Negative for chest pain and palpitations.  Gastrointestinal:  Negative for abdominal pain, nausea and vomiting.       Objective    BP (!) 148/77 (BP Location: Left Arm, Patient Position: Sitting, Cuff Size: Large)   Pulse 65   Temp 98.8 F (37.1 C) (Oral)   Resp 20   Wt 217 lb (98.4 kg)   SpO2 100% Comment: room air  BMI 28.63 kg/m    Today's Vitals   02/02/22 1013 02/02/22 1018  BP: (!) 145/79 (!) 148/77  Pulse: 66 65  Resp: 20   Temp: 98.8 F (37.1 C)   TempSrc: Oral   SpO2: 100%   Weight: 217 lb (98.4 kg)    Home BP reading this morning by brachial cuff per patient was 131/79   Physical Exam  General appearance: Well developed, well nourished male, cooperative and in no acute distress Head: Normocephalic, without obvious abnormality, atraumatic Respiratory: Respirations even and unlabored, normal respiratory rate Extremities: All extremities are intact.  Skin: Skin color, texture, turgor normal. No rashes seen  Psych: Appropriate mood and affect. Neurologic: Mental status: Alert, oriented to person,  place, and time, thought content appropriate.   Results for orders placed or performed in visit on 02/02/22  POCT HgB A1C  Result Value Ref Range   Hemoglobin A1C 6.4 (A) 4.0 - 5.6 %   Est. average glucose Bld gHb Est-mCnc 137     Assessment & Plan     1. Type 2 diabetes mellitus without complication, without long-term current use of insulin (HCC) A1c is improving, diet controlled. Recheck at [facility] in 3 months.   2. Primary hypertension Mildly elevated in office, but home BP readings fairly well controlled.       The entirety of the information documented in the History of Present Illness, Review of Systems and Physical Exam were personally obtained by me. Portions of this information were initially documented by the CMA and reviewed by me for thoroughness and accuracy.     Lelon Huh, MD  [facility] [phone] (phone) [phone] (fax)  [place]

## 2022-02-20 ENCOUNTER — Other Ambulatory Visit: Payer: Self-pay | Admitting: Family Medicine

## 2022-02-20 DIAGNOSIS — E876 Hypokalemia: Secondary | ICD-10-CM

## 2022-02-20 DIAGNOSIS — I10 Essential (primary) hypertension: Secondary | ICD-10-CM

## 2022-02-20 DIAGNOSIS — N138 Other obstructive and reflux uropathy: Secondary | ICD-10-CM

## 2022-02-22 DIAGNOSIS — Z23 Encounter for immunization: Secondary | ICD-10-CM | POA: Diagnosis not present

## 2022-03-01 ENCOUNTER — Encounter: Payer: Self-pay | Admitting: Family Medicine

## 2022-03-01 ENCOUNTER — Other Ambulatory Visit: Payer: Self-pay | Admitting: Family Medicine

## 2022-03-01 DIAGNOSIS — G8929 Other chronic pain: Secondary | ICD-10-CM

## 2022-03-01 MED ORDER — MELOXICAM 15 MG PO TABS: 15.0000 mg | ORAL_TABLET | Freq: Every day | ORAL | 2 refills | Status: DC | PRN

## 2022-03-08 MED ORDER — MONTELUKAST SODIUM 10 MG PO TABS: 10.0000 mg | ORAL_TABLET | Freq: Every day | ORAL | 3 refills | Status: AC

## 2022-05-03 NOTE — Progress Notes (Unsigned)
I,Adrian Thompson,acting as a scribe for Adrian Huh, MD.,have documented all relevant documentation on the behalf of Adrian Huh, MD,as directed by  Adrian Huh, MD while in the presence of Adrian Huh, MD.   Annual Wellness Visit     Patient: Adrian Thompson, Male    DOB: Dec 04, 1945, 76 y.o.   MRN: [id number] Visit Date: 05/04/2022  Today's Provider: Lelon Huh, MD   Chief Complaint  Patient presents with   Medicare Wellness   Subjective    Adrian Thompson is a 76 y.o. male who presents today for his Annual Wellness Visit. He reports consuming a general diet. The patient does not participate in regular exercise at present. He generally feels well. He reports sleeping well. He does not have additional problems to discuss today.  He has been having some sinus pressure the last few weeks and has started back on Nasacort and saline nasal spray.   He is also due for follow up diabetes and hypertension. Does not check sugars which are controlled with diet. Is tolerating current hypertensive medications well with no adverse effects. Home BP today was 122/79. State he had exam at Dr. Michele Mcalpine in October.   Medications: Outpatient Medications Prior to Visit  Medication Sig   ACCU-CHEK AVIVA PLUS test strip USE ONCE EVERY DAY AS DIRECTED   amitriptyline (ELAVIL) 25 MG tablet TAKE 1 TABLET AT BEDTIME AS NEEDED FOR SLEEP   amLODipine (NORVASC) 10 MG tablet TAKE 1 TABLET EVERY DAY   aspirin 81 MG tablet Take 1 tablet by mouth daily.   atenolol (TENORMIN) 50 MG tablet TAKE 1 TABLET EVERY DAY   finasteride (PROSCAR) 5 MG tablet TAKE 1 TABLET EVERY DAY   glucosamine-chondroitin 500-400 MG tablet Take 1 tablet by mouth daily.   indomethacin (INDOCIN) 25 MG capsule Take 1 capsule (25 mg total) by mouth 3 (three) times daily with meals as needed. Up to 3 times a day   losartan-hydrochlorothiazide (HYZAAR) 100-25 MG tablet TAKE 1 TABLET EVERY DAY   meloxicam (MOBIC) 15 MG tablet Take 1  tablet (15 mg total) by mouth daily as needed for pain. As needed   Misc Natural Products (BLACK CHERRY CONCENTRATE PO) Take 1 tablet by mouth 2 (two) times daily.    montelukast (SINGULAIR) 10 MG tablet Take 1 tablet (10 mg total) by mouth daily.   Multiple Vitamins-Minerals (MULTIVITAMIN ADULT PO) Take 1 tablet by mouth daily.   Omega-3 Fatty Acids (FISH OIL) 1000 MG CPDR Take 1 capsule by mouth daily.   omeprazole (PRILOSEC) 20 MG capsule TAKE 1 CAPSULE EVERY DAY   potassium chloride SA (KLOR-CON M) 20 MEQ tablet TAKE 1 TABLET EVERY DAY   pravastatin (PRAVACHOL) 20 MG tablet TAKE 1 TABLET EVERY DAY   tamsulosin (FLOMAX) 0.4 MG CAPS capsule TAKE 1 CAPSULE EVERY DAY   Triamcinolone Acetonide (NASACORT AQ NA) Place into the nose daily.   No facility-administered medications prior to visit.    No Known Allergies  Patient Care Team: Birdie Sons, MD as PCP - General (Family Medicine) Bary Castilla, Forest Gleason, MD (General Surgery) Oneta Rack, MD (Dermatology) Throat, [facility])  Review of Systems  Constitutional:  Negative for appetite change, chills and fever.  HENT:  Positive for sinus pressure.   Respiratory: Negative.  Negative for cough, chest tightness, shortness of breath and wheezing.   Cardiovascular:  Negative for chest pain, palpitations and leg swelling.  Gastrointestinal:  Negative for abdominal pain, nausea  and vomiting.  Endocrine: Positive for polyuria.  Musculoskeletal:  Positive for back pain.  Neurological:  Negative for weakness and headaches.        Objective    Vitals: BP 130/72   Pulse (!) 52   Resp 16   Ht 6' (1.829 m)   Wt 217 lb (98.4 kg)   SpO2 99%   BMI 29.43 kg/m     Physical Exam   General Appearance:    Well developed, well nourished male. Alert, cooperative, in no acute distress, appears stated age  Head:    Normocephalic, without obvious abnormality, atraumatic  Eyes:    PERRL,  conjunctiva/corneas clear, EOM's intact, fundi    benign, both eyes       Ears:    Normal TM's and external ear canals, both ears  Nose:   Nares normal, septum midline, mucosa normal, no drainage   or sinus tenderness. Hearing aid on left, cochhlear implant on right.  Throat:   Lips, mucosa, and tongue normal; teeth and gums normal  Neck:   Supple, symmetrical, trachea midline, no adenopathy;       thyroid:  No enlargement/tenderness/nodules; no carotid   bruit or JVD  Back:     Symmetric, no curvature, ROM normal, no CVA tenderness  Lungs:     Clear to auscultation bilaterally, respirations unlabored  Chest wall:    No tenderness or deformity  Heart:    Bradycardic. Normal rhythm. No murmurs, rubs, or gallops.  S1 and S2 normal  Abdomen:     Soft, non-tender, bowel sounds active all four quadrants,    no masses, no organomegaly  Genitalia:    deferred  Rectal:    deferred  Extremities:   All extremities are intact. No cyanosis or edema  Pulses:   2+ and symmetric all extremities  Skin:   Skin color, texture, turgor normal, no rashes or lesions  Lymph nodes:   Cervical, supraclavicular, and axillary nodes normal  Neurologic:   CNII-XII intact. Normal strength, sensation and reflexes      throughout  EKG:  Sinus bradycardia Nonspecific intraventricular block Minimal voltage criteria for LVH, may be normal variant   Most recent functional status assessment:    05/04/2022    8:20 AM  In your present state of health, do you have any difficulty performing the following activities:  Hearing? 1  Vision? 1  Difficulty concentrating or making decisions? 0  Walking or climbing stairs? 0  Dressing or bathing? 0  Doing errands, shopping? 0   Most recent fall risk assessment:    05/04/2022    8:20 AM  Fall Risk   Falls in the past year? 0  Number falls in past yr: 0  Injury with Fall? 0  Risk for fall due to : No Fall Risks  Follow up Falls evaluation completed    Most recent  depression screenings:    05/04/2022    8:21 AM 02/02/2022   10:22 AM  PHQ 2/9 Scores  PHQ - 2 Score 0 0  PHQ- 9 Score 2 1   Most recent cognitive screening:     No data to display         Most recent Audit-C alcohol use screening    05/04/2022    8:20 AM  Alcohol Use Disorder Test (AUDIT)  1. How often do you have a drink containing alcohol? 0  2. How many drinks containing alcohol do you have on a typical day when you are drinking? 0  3. How often do you have six or more drinks on one occasion? 0  AUDIT-C Score 0   A score of 3 or more in women, and 4 or more in men indicates increased risk for alcohol abuse, EXCEPT if all of the points are from question 1   No results found for any visits on 05/04/22.  Assessment & Plan     Annual wellness visit done today including the all of the following: Reviewed patient's Family Medical History Reviewed and updated list of patient's medical providers Assessment of cognitive impairment was done Assessed patient's functional ability Established a written schedule for health screening [place] Completed and Reviewed  Exercise Activities and Dietary recommendations  Goals      Exercise 3x per week (30 min per time)     Recommend to start exercising three times a week for 30 minutes.         Immunization History  Administered Date(s) Administered   Fluad Quad(high Dose 65+) 02/23/2021   Influenza, High Dose Seasonal PF 03/02/2015, 03/07/2016, 02/23/2018   Influenza-Unspecified 03/09/2017, 02/13/2019, 02/15/2020   PFIZER(Purple Top)SARS-COV-2 Vaccination 07/02/2019, 07/23/2019, 02/15/2020   Pneumococcal Conjugate-13 02/26/2014   Pneumococcal Polysaccharide-23 02/25/2013   Td 03/20/2001   Tdap 02/14/2011   Zoster Recombinat (Shingrix) 08/31/2017, 11/08/2017   Zoster, Live 06/19/2007    Health Maintenance  Topic Date Due   DTaP/Tdap/Td (3 - Td or Tdap) 02/13/2021   OPHTHALMOLOGY EXAM  01/12/2022    COVID-19 Vaccine (4 - 2023-24 season) 01/21/2022   Diabetic kidney evaluation - eGFR measurement  05/03/2022   Medicare Annual Wellness (AWV)  05/03/2022   INFLUENZA VACCINE  08/21/2022 (Originally 12/21/2021)   HEMOGLOBIN A1C  08/03/2022   Diabetic kidney evaluation - Urine ACR  11/03/2022   COLONOSCOPY (Pts 45-35yr Insurance coverage will need to be confirmed)  04/05/2023   Pneumonia Vaccine 75 Years old  Completed   Zoster Vaccines- Shingrix  Completed   Hepatitis C Screening  Addressed   HPV VACCINES  Aged Out     Discussed health benefits of physical activity, and encouraged him to engage in regular exercise appropriate for his age and condition.   He states he had flu vaccine and covid booster on 02/22/2022 and [place]     2. Prostate cancer screening  - PSA Total (Reflex To Free) ([facility] only)  3. Needs Td Recommend he discuss with pharmacist.   4. Primary hypertension Well controlled.  Continue current medications.   - CBC - Comprehensive metabolic panel  5. Type 2 diabetes mellitus without complication, without long-term current use of insulin (HCC) Diet controlled.  - Hemoglobin A1c  6. Gastroesophageal reflux disease, unspecified whether esophagitis present Well controlled on PPI  7. Hyperlipidemia, unspecified hyperlipidemia type He is tolerating pravastatin well with no adverse effects.   - Lipid panel     The entirety of the information documented in the History of Present Illness, Review of Systems and Physical Exam were personally obtained by me. Portions of this information were initially documented by the CMA and reviewed by me for thoroughness and accuracy.     DLelon Huh MD  [phone](phone) [phone](fax)  [place]

## 2022-05-04 ENCOUNTER — Encounter: Payer: Self-pay | Admitting: Family Medicine

## 2022-05-04 ENCOUNTER — Ambulatory Visit (INDEPENDENT_AMBULATORY_CARE_PROVIDER_SITE_OTHER): Payer: Medicare Other | Admitting: Family Medicine

## 2022-05-04 DIAGNOSIS — E119 Type 2 diabetes mellitus without complications: Secondary | ICD-10-CM

## 2022-05-04 DIAGNOSIS — Z87891 Personal history of nicotine dependence: Secondary | ICD-10-CM

## 2022-05-04 DIAGNOSIS — Z23 Encounter for immunization: Secondary | ICD-10-CM

## 2022-05-04 DIAGNOSIS — Z0000 Encounter for general adult medical examination without abnormal findings: Secondary | ICD-10-CM | POA: Diagnosis not present

## 2022-05-04 DIAGNOSIS — I10 Essential (primary) hypertension: Secondary | ICD-10-CM

## 2022-05-04 DIAGNOSIS — Z125 Encounter for screening for malignant neoplasm of prostate: Secondary | ICD-10-CM

## 2022-05-04 DIAGNOSIS — E785 Hyperlipidemia, unspecified: Secondary | ICD-10-CM

## 2022-05-04 DIAGNOSIS — K219 Gastro-esophageal reflux disease without esophagitis: Secondary | ICD-10-CM | POA: Diagnosis not present

## 2022-05-04 NOTE — Patient Instructions (Addendum)
Please review the attached list of medications and notify my office if there are any errors.   Please bring all of your medications to every appointment so we can make sure that our medication list is the same as yours.   You are due for a Tdap (tetanus-diptheria-pertussis vaccine) which protects you from tetanus and whooping cough. Please check with your insurance plan or pharmacy regarding coverage for this vaccine.   

## 2022-05-05 LAB — LIPID PANEL
Chol/HDL Ratio: 2.4 ratio (ref 0.0–5.0)
Cholesterol, Total: 140 mg/dL (ref 100–199)
HDL: 59 mg/dL (ref >39)
LDL Chol Calc (NIH): 64 mg/dL (ref 0–99)
Triglycerides: 89 mg/dL (ref 0–149)
VLDL Cholesterol Cal: 17 mg/dL (ref 5–40)

## 2022-05-05 LAB — HEMOGLOBIN A1C
Est. average glucose Bld gHb Est-mCnc: 143 mg/dL
Hgb A1c MFr Bld: 6.6 % — ABNORMAL HIGH (ref 4.8–5.6)

## 2022-05-05 LAB — COMPREHENSIVE METABOLIC PANEL
ALT: 13 IU/L (ref 0–44)
AST: 16 IU/L (ref 0–40)
Albumin/Globulin Ratio: 1.9 (ref 1.2–2.2)
Albumin: 4.2 g/dL (ref 3.8–4.8)
Alkaline Phosphatase: 62 IU/L (ref 44–121)
BUN/Creatinine Ratio: 11 (ref 10–24)
BUN: 11 mg/dL (ref 8–27)
Bilirubin Total: 0.6 mg/dL (ref 0.0–1.2)
CO2: 27 mmol/L (ref 20–29)
Calcium: 9.3 mg/dL (ref 8.6–10.2)
Chloride: 100 mmol/L (ref 96–106)
Creatinine, Ser: 1 mg/dL (ref 0.76–1.27)
Globulin, Total: 2.2 g/dL (ref 1.5–4.5)
Glucose: 155 mg/dL — ABNORMAL HIGH (ref 70–99)
Potassium: 3.6 mmol/L (ref 3.5–5.2)
Sodium: 140 mmol/L (ref 134–144)
Total Protein: 6.4 g/dL (ref 6.0–8.5)
eGFR: 78 mL/min/{1.73_m2} (ref >59)

## 2022-05-05 LAB — CBC
Hematocrit: 45.5 % (ref 37.5–51.0)
Hemoglobin: 15.1 g/dL (ref 13.0–17.7)
MCH: 29.5 pg (ref 26.6–33.0)
MCHC: 33.2 g/dL (ref 31.5–35.7)
MCV: 89 fL (ref 79–97)
Platelets: 224 10*3/uL (ref 150–450)
RBC: 5.11 x10E6/uL (ref 4.14–5.80)
RDW: 12.7 % (ref 11.6–15.4)
WBC: 7.2 10*3/uL (ref 3.4–10.8)

## 2022-05-05 LAB — PSA TOTAL (REFLEX TO FREE): Prostate Specific Ag, Serum: 1.6 ng/mL (ref 0.0–4.0)

## 2022-05-23 ENCOUNTER — Other Ambulatory Visit: Payer: Self-pay | Admitting: Family Medicine

## 2022-05-25 NOTE — Telephone Encounter (Signed)
Requested Prescriptions  Pending Prescriptions Disp Refills   losartan-hydrochlorothiazide (HYZAAR) 100-25 MG tablet [Pharmacy Med Name: LOSARTAN POTASSIUM/HYDROCHLOROTHIAZIDE 100-25 MG Tablet] 90 tablet 3    Sig: TAKE 1 TABLET EVERY DAY     Cardiovascular: ARB + Diuretic Combos Passed - 05/23/2022  4:35 PM      Passed - K in normal range and within 180 days    Potassium  Date Value Ref Range Status  05/04/2022 3.6 3.5 - 5.2 mmol/L Final         Passed - Na in normal range and within 180 days    Sodium  Date Value Ref Range Status  05/04/2022 140 134 - 144 mmol/L Final         Passed - Cr in normal range and within 180 days    Creat  Date Value Ref Range Status  04/03/2017 0.93 0.70 - 1.18 mg/dL Final    Comment:    For patients >60 years of age, the reference limit for Creatinine is approximately 13% higher for people identified as African-American. .    Creatinine, Ser  Date Value Ref Range Status  05/04/2022 1.00 0.76 - 1.27 mg/dL Final   Creatinine, POC  Date Value Ref Range Status  10/03/2016 n/a mg/dL Final         Passed - eGFR is 10 or above and within 180 days    GFR, Est African American  Date Value Ref Range Status  04/03/2017 96 > OR = 60 mL/min/1.82m Final   GFR calc Af Amer  Date Value Ref Range Status  04/13/2020 88 >59 mL/min/1.73 Final    Comment:    **In accordance with recommendations from the NKF-ASN Task force,**   [facility] is in the process of updating its eGFR calculation to the   2021 CKD-EPI creatinine equation that estimates kidney function   without a race variable.    GFR, Est Non African American  Date Value Ref Range Status  04/03/2017 83 > OR = 60 mL/min/1.717mFinal   GFR, Estimated  Date Value Ref Range Status  10/15/2020 >60 >60 mL/min Final    Comment:    (NOTE) Calculated using the CKD-EPI Creatinine Equation (2021)    eGFR  Date Value Ref Range Status  05/04/2022 78 >59 mL/min/1.73 Final         Passed - Patient  is not pregnant      Passed - Last BP in normal range    BP Readings from Last 1 Encounters:  05/04/22 130/72         Passed - Valid encounter within last 6 months    Recent Outpatient Visits           3 weeks ago Medicare annual wellness visit, subsequent   [facility]   3 months ago Type 2 diabetes mellitus without complication, without long-term current use of insulin ([facility]  [facility]   6 months ago Type 2 diabetes mellitus without complication, [facility] long-[facility]  [facility]   1 year ago Medicare annual wellness visit, subsequent   [facility]   1 year ago Neck pain   [facility]       Future Appointments             In 3 months Fisher, DoKirstie PeriMD [facility]

## 2022-06-10 ENCOUNTER — Other Ambulatory Visit: Payer: Self-pay | Admitting: Family Medicine

## 2022-06-10 DIAGNOSIS — K219 Gastro-esophageal reflux disease without esophagitis: Secondary | ICD-10-CM

## 2022-06-10 DIAGNOSIS — E785 Hyperlipidemia, unspecified: Secondary | ICD-10-CM

## 2022-06-13 ENCOUNTER — Other Ambulatory Visit: Payer: Self-pay | Admitting: Family Medicine

## 2022-06-13 DIAGNOSIS — E785 Hyperlipidemia, unspecified: Secondary | ICD-10-CM

## 2022-06-13 DIAGNOSIS — K219 Gastro-esophageal reflux disease without esophagitis: Secondary | ICD-10-CM

## 2022-06-13 NOTE — Telephone Encounter (Signed)
Unable to refill per protocol, Rx request is too soon. Last refill 05/25/21 for 90 and 4 refills. Will refuse.  Requested Prescriptions  Pending Prescriptions Disp Refills   pravastatin (PRAVACHOL) 20 MG tablet [Pharmacy Med Name: PRAVASTATIN SODIUM 20 MG Tablet] 90 tablet 3    Sig: TAKE 1 TABLET EVERY DAY     Cardiovascular:  Antilipid - Statins Failed - 06/10/2022 11:28 AM      Failed - Lipid Panel in normal range within the last 12 months    Cholesterol, Total  Date Value Ref Range Status  05/04/2022 140 100 - 199 mg/dL Final   LDL Cholesterol (Calc)  Date Value Ref Range Status  04/03/2017 72 mg/dL (calc) Final    Comment:    Reference range: <100 . Desirable range <100 mg/dL for primary prevention;   <70 mg/dL for patients with CHD or diabetic patients  with > or = 2 CHD risk factors. Marland Kitchen LDL-C is now calculated using the Martin-Hopkins  calculation, which is a validated novel method providing  better accuracy than the Friedewald equation in the  estimation of LDL-C.  Cresenciano Genre et al. Annamaria Helling. 4782;956(21): 2061-2068  (http://education.[employer].com/faq/FAQ164)    LDL Chol Calc (NIH)  Date Value Ref Range Status  05/04/2022 64 0 - 99 mg/dL Final   HDL  Date Value Ref Range Status  05/04/2022 59 >39 mg/dL Final   Triglycerides  Date Value Ref Range Status  05/04/2022 89 0 - 149 mg/dL Final         Passed - Patient is not pregnant      Passed - Valid encounter within last 12 months    Recent Outpatient Visits           1 month ago Medicare annual wellness visit, subsequent   [place], Donald E, MD   4 months ago Type 2 diabetes mellitus without complication, without long-term current use of insulin ([place])   [place], Donald E, MD   7 months ago Type 2 diabetes mellitus without complication, without long-term current use of insulin ([place])   [place] Birdie Sons, MD   1 year ago Medicare annual wellness visit, subsequent   [place], Donald E, MD   1 year ago Neck pain   [place], MD       Future Appointments             In 2 months Fisher, Kirstie Peri, MD [place], [facility]             omeprazole (PRILOSEC) 20 MG capsule [Pharmacy Med Name: OMEPRAZOLE 20 MG Capsule Delayed Release] 90 capsule 3    Sig: TAKE [place]     Gastroenterology: Proton Pump Inhibitors Passed - 06/10/2022 11:28 AM      Passed - Valid encounter within last 12 months    Recent Outpatient Visits           1 month ago Medicare annual wellness visit, subsequent   [place], Donald E, MD   4 months ago Type 2 diabetes mellitus without complication, without long-term current use of insulin ([place])   [place], Donald E, MD   7 months ago Type 2 diabetes mellitus without complication, without long-term current use of insulin ([place])   [place],  Kirstie Peri, MD   1 year ago Medicare annual wellness visit, subsequent   [facility] Birdie Sons, MD   1 year ago Neck pain   [place], Donald E, MD       Future Appointments             In 2 months Fisher, Kirstie Peri, MD [facility]

## 2022-06-14 DIAGNOSIS — Z45321 Encounter for adjustment and management of cochlear device: Secondary | ICD-10-CM | POA: Diagnosis not present

## 2022-06-14 DIAGNOSIS — H903 Sensorineural hearing loss, bilateral: Secondary | ICD-10-CM | POA: Diagnosis not present

## 2022-06-16 ENCOUNTER — Encounter: Payer: Self-pay | Admitting: Family Medicine

## 2022-06-16 DIAGNOSIS — E785 Hyperlipidemia, unspecified: Secondary | ICD-10-CM

## 2022-06-16 DIAGNOSIS — K219 Gastro-esophageal reflux disease without esophagitis: Secondary | ICD-10-CM

## 2022-06-16 MED ORDER — OMEPRAZOLE 20 MG PO CPDR: 20.0000 mg | DELAYED_RELEASE_CAPSULE | Freq: Every day | ORAL | 4 refills | Status: DC

## 2022-06-16 MED ORDER — PRAVASTATIN SODIUM 20 MG PO TABS: 20.0000 mg | ORAL_TABLET | Freq: Every day | ORAL | 4 refills | Status: DC

## 2022-07-30 ENCOUNTER — Other Ambulatory Visit: Payer: Self-pay | Admitting: Family Medicine

## 2022-08-01 ENCOUNTER — Encounter: Payer: Self-pay | Admitting: Family Medicine

## 2022-08-01 NOTE — Telephone Encounter (Signed)
Requested medication (s) are due for refill today: expired medication  Requested medication (s) are on the active medication list: yes   Last refill:  07/27/21 #100 3 refills   Future visit scheduled: yes in 1 month  Notes to clinic:  expired . Do you want to renew Rx?     Requested Prescriptions  Pending Prescriptions Disp Refills   ACCU-CHEK AVIVA PLUS test strip [Pharmacy Med Name: ACCU-CHEK AVIVA PLUS TEST STRIP 50S] 100 strip 3    Sig: USE ONCE EVERY DAY AS DIRECTED     Endocrinology: Diabetes - Testing Supplies Passed - 07/30/2022  9:28 AM      Passed - Valid encounter within last 12 months    Recent Outpatient Visits           2 months ago Medicare annual wellness visit, subsequent   [place], Donald E, MD   6 months ago Type 2 diabetes mellitus without complication, without long-term current use of insulin ([place])   [place] Birdie Sons, MD   9 months ago Type 2 diabetes mellitus without complication, without long-term current use of insulin ([place])   [place] Birdie Sons, MD   1 year ago Medicare annual wellness visit, subsequent   [place], Donald E, MD   1 year ago Neck pain   [place], MD       Future Appointments             In 1 month Fisher, Kirstie Peri, MD [facility], [facility]

## 2022-08-14 ENCOUNTER — Other Ambulatory Visit: Payer: Self-pay | Admitting: Family Medicine

## 2022-08-14 DIAGNOSIS — I10 Essential (primary) hypertension: Secondary | ICD-10-CM

## 2022-08-15 NOTE — Telephone Encounter (Signed)
Requested Prescriptions  Pending Prescriptions Disp Refills   atenolol (TENORMIN) 50 MG tablet [Pharmacy Med Name: ATENOLOL 50 MG Tablet] 90 tablet 0    Sig: TAKE 1 TABLET EVERY DAY     Cardiovascular: Beta Blockers 2 Passed - 08/14/2022  2:29 PM      Passed - Cr in normal range and within 360 days    Creat  Date Value Ref Range Status  04/03/2017 0.93 0.70 - 1.18 mg/dL Final    Comment:    For patients >76 years of age, the reference limit for Creatinine is approximately 13% higher for people identified as African-American. .    Creatinine, Ser  Date Value Ref Range Status  05/04/2022 1.00 0.76 - 1.27 mg/dL Final   Creatinine, POC  Date Value Ref Range Status  10/03/2016 n/a mg/dL Final         Passed - Last BP in normal range    BP Readings from Last 1 Encounters:  05/04/22 130/72         Passed - Last Heart Rate in normal range    Pulse Readings from Last 1 Encounters:  05/04/22 (!) 52         Passed - Valid encounter within last 6 months    Recent Outpatient Visits           3 months ago Medicare annual wellness visit, subsequent   [place], Donald E, MD   6 months ago Type 2 diabetes mellitus without complication, without long-term current use of insulin ([place])   [place] Birdie Sons, MD   9 months ago Type 2 diabetes mellitus without complication, without long-term current use of insulin ([place])   [place] Birdie Sons, MD   1 year ago Medicare annual wellness visit, subsequent   [place], Donald E, MD   1 year ago Neck pain   [place], MD       Future Appointments             In 3 weeks Fisher, Kirstie Peri, MD [facility], [facility]

## 2022-08-18 ENCOUNTER — Ambulatory Visit (INDEPENDENT_AMBULATORY_CARE_PROVIDER_SITE_OTHER): Payer: Medicare Other

## 2022-08-18 ENCOUNTER — Ambulatory Visit
Admission: RE | Admit: 2022-08-18 | Discharge: 2022-08-18 | Disposition: A | Discharge status: Home / Self Care | Payer: Medicare Other | Source: Ambulatory Visit | Attending: Family Medicine | Admitting: Family Medicine

## 2022-08-18 VITALS — BP 157/88 | HR 62 | Resp 18

## 2022-08-18 DIAGNOSIS — S93402A Sprain of unspecified ligament of left ankle, initial encounter: Secondary | ICD-10-CM

## 2022-08-18 DIAGNOSIS — M7989 Other specified soft tissue disorders: Secondary | ICD-10-CM | POA: Diagnosis not present

## 2022-08-18 DIAGNOSIS — S161XXA Strain of muscle, fascia and tendon at neck level, initial encounter: Secondary | ICD-10-CM | POA: Diagnosis not present

## 2022-08-18 MED ORDER — NAPROXEN 500 MG PO TABS: 500.0000 mg | ORAL_TABLET | Freq: Two times a day (BID) | ORAL | 0 refills | Status: DC

## 2022-08-18 MED ORDER — METHOCARBAMOL 500 MG PO TABS: 500.0000 mg | ORAL_TABLET | Freq: Two times a day (BID) | ORAL | 0 refills | Status: DC

## 2022-08-18 NOTE — Discharge Instructions (Addendum)
Your x-ray did not show any fractured bones or dislocated bones.  You most likely sprained your ankle.  For your neck pain: I sent a muscle relaxer to your pharmacy.  This may make you sleepy so try to take it at night though you can take it twice a day if it does not make you very sleepy.  For ankle pain: Hold the meloxicam and only take the Naprosyn twice a day with Tylenol 1000 mg up to 3 times a day.  You can apply a lidocaine patch to the side of your ankle.

## 2022-08-18 NOTE — ED Provider Notes (Signed)
[facility]    CSN: [id number] Arrival date & time: 08/18/22  [id number]      History   Chief Complaint Chief Complaint  Patient presents with   Ankle Pain    ankle pain from fall - Entered by patient    HPI  HPI Adrian Thompson is a 77 y.o. male.   Adrian Thompson presents for left ankle pain after a fall last Friday.  States that he was using a pole saw and went to reach his hands upward and lost his balance tripping over the limbs. He did not recall hearing any abnormal pops and sounds however the wife notes that he may not have heard it anyway due to his hearing loss.  He has taken some meloxicam and muscle relaxers for his pain.  He applied some ice to the area.  Pain is worse with walking.  Endorses left neck pain from the fall but this has improved somewhat.  Continues to have pain when looking to the left.  Denies shoulder pain.  Has some known bone spurs in his left shoulder.    Past Medical History:  Diagnosis Date   Gout    History of chicken pox    History of measles    History of mumps     Patient Active Problem List   Diagnosis Date Noted   Aortic atherosclerosis ([place]) 01/12/2021   Chronic left shoulder pain 04/13/2020   Frequent PVCs 04/03/2018   Hypokalemia 07/18/2016   Cochlear implant in place 06/28/2016   GERD (gastroesophageal reflux disease) 02/26/2015   Hearing loss 02/26/2015   HLD (hyperlipidemia) 02/26/2015   Benign prostatic hyperplasia with urinary obstruction 09/04/2012   Chronic prostatitis 06/04/2012   Allergic rhinitis 02/08/2009   Diverticulosis of colon without hemorrhage 02/26/2008   Diabetes mellitus ([place]) 02/04/2008   Gout 02/01/2007   Primary hypertension 02/01/2007   Irritable bowel syndrome 05/23/1998    Past Surgical History:  Procedure Laterality Date   COCHLEAR IMPLANT Right 06/28/2016   Vira Blanco, MD [facility]   COLONOSCOPY  2009   COLONOSCOPY WITH PROPOFOL N/A 04/04/2018   Procedure: COLONOSCOPY WITH PROPOFOL;   Surgeon: Robert Bellow, MD;  Location: [facility] ENDOSCOPY;  Service: Endoscopy;  Laterality: N/A;   EXTERNAL EAR SURGERY  2018   IR RADIOLOGIST EVAL & MGMT  11/11/2020   IR RADIOLOGIST EVAL & MGMT  12/30/2020   TONSILLECTOMY     UPPER GI ENDOSCOPY  2009   UPPER GI ENDOSCOPY  03/04/2011   [facility]; Excision of multiple gastric polyps       Home Medications    Prior to Admission medications   Medication Sig Start Date End Date Taking? Authorizing Provider  methocarbamol (ROBAXIN) 500 MG tablet Take 1 tablet (500 mg total) by mouth 2 (two) times daily. 08/18/22  Yes Shantinique Picazo, DO  naproxen (NAPROSYN) 500 MG tablet Take 1 tablet (500 mg total) by mouth 2 (two) times daily with a meal. 08/18/22  Yes Shanikia Kernodle, DO  ACCU-CHEK AVIVA PLUS test strip USE ONCE EVERY DAY AS DIRECTED 08/02/22   Birdie Sons, MD  amitriptyline (ELAVIL) 25 MG tablet TAKE 1 TABLET AT BEDTIME AS NEEDED FOR SLEEP 09/20/21   Birdie Sons, MD  amLODipine (NORVASC) 10 MG tablet TAKE 1 TABLET EVERY DAY 02/21/22   Birdie Sons, MD  aspirin 81 MG tablet Take 1 tablet by mouth daily. 11/10/11   [provider]  atenolol (TENORMIN) 50 MG tablet TAKE 1 TABLET EVERY DAY 08/15/22  Birdie Sons, MD  finasteride (PROSCAR) 5 MG tablet TAKE 1 TABLET EVERY DAY 02/21/22   Birdie Sons, MD  glucosamine-chondroitin 500-400 MG tablet Take 1 tablet by mouth daily. 02/07/07   [provider]  indomethacin (INDOCIN) 25 MG capsule Take 1 capsule (25 mg total) by mouth 3 (three) times daily with meals as needed. Up to 3 times a day 11/02/21   Birdie Sons, MD  losartan-hydrochlorothiazide [facility]) 100-25 MG tablet TAKE 1 TABLET EVERY DAY 05/25/22   Birdie Sons, MD  meloxicam (MOBIC) 15 MG tablet Take 1 tablet (15 mg total) by mouth daily as needed for pain. As needed 03/01/22   Birdie Sons, MD  Misc Natural Products (BLACK CHERRY CONCENTRATE PO) Take 1 tablet by mouth 2 (two) times daily.      [provider]  montelukast (SINGULAIR) 10 MG tablet Take 1 tablet (10 mg total) by mouth daily. 03/08/22   Birdie Sons, MD  Multiple Vitamins-Minerals (MULTIVITAMIN ADULT PO) Take 1 tablet by mouth daily. 09/25/08   [provider]  Omega-3 Fatty Acids (FISH OIL) 1000 MG CPDR Take 1 capsule by mouth daily. 11/10/11   [provider]  omeprazole (PRILOSEC) 20 MG capsule Take 1 capsule (20 mg total) by mouth daily. 06/16/22   Birdie Sons, MD  potassium chloride SA (KLOR-CON M) 20 MEQ tablet TAKE 1 TABLET EVERY DAY 02/21/22   Birdie Sons, MD  pravastatin (PRAVACHOL) 20 MG tablet Take 1 tablet (20 mg total) by mouth daily. 06/16/22   Birdie Sons, MD  tamsulosin (FLOMAX) 0.4 MG CAPS capsule TAKE 1 CAPSULE EVERY DAY 09/20/21   Birdie Sons, MD  Triamcinolone Acetonide (NASACORT AQ NA) Place into the nose daily.    [provider]    Family History Family History  Problem Relation Age of Onset   Hypertension Mother    Hypertension Sister     Social History Social History   Tobacco Use   Smoking status: Never   Smokeless tobacco: Former    Types: Snuff, Chew  Vaping Use   Vaping Use: Never used  Substance Use Topics   Alcohol use: No    Alcohol/week: 0.0 standard drinks of alcohol   Drug use: No     Allergies   Patient has no known allergies.   Review of Systems Review of Systems: :negative unless otherwise stated in HPI.      Physical Exam Triage Vital Signs ED Triage Vitals  Enc Vitals Group     BP      Pulse      Resp      Temp      Temp src      SpO2      Weight      Height      Head Circumference      Peak Flow      Pain Score      Pain Loc      Pain Edu?      Excl. in [place]?    No data found.  Updated Vital Signs BP (!) 157/88 (BP Location: Left Arm)   Pulse 62   Resp 18   SpO2 95%   Visual Acuity Right Eye Distance:   Left Eye Distance:   Bilateral Distance:    Right Eye Near:   Left Eye  Near:    Bilateral Near:     Physical Exam GEN: well appearing male in no acute distress  NECK: No C-spine's tenderness, limited active left rotation, has multiple tender points in the left lateral trapezius with hypertonicity CVS: well perfused, regular rate, DP pulse strong   RESP: speaking in full sentences without pause, no respiratory distress  MSK:  Right ankle: Inspection: +erythema, +edema, +ecchymosis, no bony deformity Palpation: Tenderness of the lateral malleolus extending proximally  ROM: Full active and passive range of motion Strength: 5/5 in all directions No ligamentous laxity No pain at the base of the fifth metatarsal Able to ambulate but with pain Special Tests: Negative anterior and posterior drawer, negative calcaneal squeeze Neurovascularly intact, no instability noted    UC Treatments / Results  Labs (all labs ordered are listed, but only abnormal results are displayed) Labs Reviewed - No data to display  EKG   Radiology DG Ankle Complete Left  Result Date: 08/18/2022 CLINICAL DATA:  Fall a week ago with lateral pain and swelling. EXAM: LEFT ANKLE COMPLETE - 3+ VIEW COMPARISON:  None Available. FINDINGS: Three views. No acute fracture or dislocation. Soft tissue swelling along the lateral malleolus. No joint effusion. Mild plantar and calcaneal enthesopathy. No evidence of arthropathy. IMPRESSION: No acute fracture or dislocation. Soft tissue swelling along the lateral malleolus. Electronically Signed   By: Emmit Alexanders M.D.   On: 08/18/2022 11:03    Procedures Procedures (including critical care time)  Medications Ordered in UC Medications - No data to display  Initial Impression / Assessment and Plan / UC Course  I have reviewed the triage vital signs and the nursing notes.  Pertinent labs & imaging results that were available during my care of the patient were reviewed by me and considered in my medical decision making (see chart for  details).      Pt is a 77 y.o.  male with 6 days of left ankle pain and left lateral neck pain after a fall at home.  He has multiple tender points with hypertonicity of the lateral left trapezius.  He has mild edema, erythema and ecchymosis of the near the lateral malleolus where he is tender.  Obtained left ankle plain films. Personally interpreted by me were unremarkable for fracture or dislocation. Radiologist report reviewed and additionally notes soft tissue swelling along the lateral malleolus. Given ankle brace prior to discharge.  Patient to gradually return to normal activities, as tolerated and continue ordinary activities within the limits permitted by pain. Prescribed Naproxen sodium  and Robaxin for pain relief.  Tylenol PRN. Advised patient to avoid OTC NSAIDs while taking prescription Naprosyn. Counseled patient on red flag symptoms and when to seek immediate care.   Patient to follow up with orthopedic provider, if symptoms do not improve with conservative treatment.  Return and ED precautions given. Understanding voiced. Discussed MDM, treatment plan and plan for follow-up with patient who agrees with plan.   Final Clinical Impressions(s) / UC Diagnoses   Final diagnoses:  Sprain of left ankle, unspecified ligament, initial encounter  Acute strain of neck muscle, initial encounter     Discharge Instructions      Your x-ray did not show any fractured bones or dislocated bones.  You most likely sprained your ankle.  For your neck pain: I sent a muscle relaxer to your pharmacy.  This may make you sleepy so try to take it at night though you can take it twice a day if it does not make you very sleepy.  For ankle pain: Hold the meloxicam and only take the Naprosyn twice a day with  Tylenol 1000 mg up to 3 times a day.  You can apply a lidocaine patch to the side of your ankle.     ED Prescriptions     Medication Sig Dispense Auth. Provider   methocarbamol (ROBAXIN) 500  MG tablet Take 1 tablet (500 mg total) by mouth 2 (two) times daily. 20 tablet Adysen Raphael, DO   naproxen (NAPROSYN) 500 MG tablet Take 1 tablet (500 mg total) by mouth 2 (two) times daily with a meal. 30 tablet Ch[place]uka Churchill, DO      PDMP not reviewed this encounter.   Lyndee Hensen, DO 08/18/22 1245

## 2022-08-18 NOTE — ED Triage Notes (Signed)
Patient presents to UC for left ankle injury and left sided neck pain x 6 days ago. Patient states he fell. Treating pain with mobic, advil, and tylenol.   Denies head injury.

## 2022-08-29 DIAGNOSIS — Z23 Encounter for immunization: Secondary | ICD-10-CM | POA: Diagnosis not present

## 2022-09-06 NOTE — Progress Notes (Unsigned)
I,Adrian Thompson,acting as a scribe for Adrian Merry, MD.,have documented all relevant documentation on the behalf of Adrian Merry, MD,as directed by  Adrian Merry, MD while in the presence of Adrian Merry, MD.     Established patient visit   Patient: Adrian Thompson   DOB: January 29, 1946   77 y.o. Male  MRN: [id number] Visit Date: 09/07/2022  Today's healthcare provider: [name], MD   Chief Complaint  Patient presents with   Diabetes   Hypertension   Hyperlipidemia   Subjective    HPI  Follow up urgent care visit  Patient was seen in UC for fall on 08/18/22. He was treated for sprain of left ankle. Treatment for this included robaxin 500 mg BID. Naproxen 500 mg BID. He reports excellent compliance with treatment. He reports this condition is Improved.  -----------------------------------------------------------------------------------------  Diabetes Mellitus Type II, follow-up  Lab Results  Component Value Date   HGBA1C 6.4 (A) 09/07/2022   HGBA1C 6.6 (H) 05/04/2022   HGBA1C 6.4 (A) 02/02/2022   Last seen for diabetes 4 months ago.  Management since then includes continuing the same treatment. He reports excellent compliance with treatment. He is not having side effects.   Home blood sugar records: fasting range: 110-140s  Episodes of hypoglycemia? No    Current insulin regiment: none Most Recent Eye Exam: UTD  --------------------------------------------------------------------------------------------------- Hypertension, follow-up  BP Readings from Last 3 Encounters:  09/07/22 (!) 140/68  08/18/22 (!) 157/88  05/04/22 130/72   Wt Readings from Last 3 Encounters:  09/07/22 213 lb (96.6 kg)  05/04/22 217 lb (98.4 kg)  02/02/22 217 lb (98.4 kg)     He was last seen for hypertension 4 months ago.  BP at that visit was 130/72. Management since that visit includes no changes. He reports excellent compliance with treatment. He is not having side  effects.    Outside blood pressures are stable, some elevated in the 140-150s. --------------------------------------------------------------------------------------------------- Lipid/Cholesterol, follow-up  Last Lipid Panel: Lab Results  Component Value Date   CHOL 140 05/04/2022   LDLCALC 64 05/04/2022   HDL 59 05/04/2022   TRIG 89 05/04/2022    He was last seen for this 4 months ago.  Management since that visit includes no changes.  He reports excellent compliance with treatment. He is not having side effects.   Last metabolic panel Lab Results  Component Value Date   GLUCOSE 155 (H) 05/04/2022   NA 140 05/04/2022   K 3.6 05/04/2022   BUN 11 05/04/2022   CREATININE 1.00 05/04/2022   EGFR 78 05/04/2022   GFRNONAA >60 10/15/2020   CALCIUM 9.3 05/04/2022   AST 16 05/04/2022   ALT 13 05/04/2022   The 10-year ASCVD risk score (Arnett DK, et al., 2019) is: 46.8%  --------------------------------------------------------------------------------------------------- He also reports he twisted his ankle a few weeks ago and went to orthopedics for evaluation, was also prescribed methocarbamol for neck strain which has been helping and he would like to have prescription to keep on hand for the future.   Medications: Outpatient Medications Prior to Visit  Medication Sig   ACCU-CHEK AVIVA PLUS test strip USE ONCE EVERY DAY AS DIRECTED   amitriptyline (ELAVIL) 25 MG tablet TAKE 1 TABLET AT BEDTIME AS NEEDED FOR SLEEP   amLODipine (NORVASC) 10 MG tablet TAKE 1 TABLET EVERY DAY   aspirin 81 MG tablet Take 1 tablet by mouth daily.   atenolol (TENORMIN) 50 MG tablet TAKE 1 TABLET EVERY DAY   finasteride (PROSCAR) 5  MG tablet TAKE 1 TABLET EVERY DAY   glucosamine-chondroitin 500-400 MG tablet Take 1 tablet by mouth daily.   indomethacin (INDOCIN) 25 MG capsule Take 1 capsule (25 mg total) by mouth 3 (three) times daily with meals as needed. Up to 3 times a day    losartan-hydrochlorothiazide (HYZAAR) 100-25 MG tablet TAKE 1 TABLET EVERY DAY   meloxicam (MOBIC) 15 MG tablet Take 1 tablet (15 mg total) by mouth daily as needed for pain. As needed   methocarbamol (ROBAXIN) 500 MG tablet Take 1 tablet (500 mg total) by mouth 2 (two) times daily.   Misc Natural Products (BLACK CHERRY CONCENTRATE PO) Take 1 tablet by mouth 2 (two) times daily.    montelukast (SINGULAIR) 10 MG tablet Take 1 tablet (10 mg total) by mouth daily.   Multiple Vitamins-Minerals (MULTIVITAMIN ADULT PO) Take 1 tablet by mouth daily.   naproxen (NAPROSYN) 500 MG tablet Take 1 tablet (500 mg total) by mouth 2 (two) times daily with a meal.   Omega-3 Fatty Acids (FISH OIL) 1000 MG CPDR Take 1 capsule by mouth daily.   omeprazole (PRILOSEC) 20 MG capsule Take 1 capsule (20 mg total) by mouth daily.   potassium chloride SA (KLOR-CON M) 20 MEQ tablet TAKE 1 TABLET EVERY DAY   pravastatin (PRAVACHOL) 20 MG tablet Take 1 tablet (20 mg total) by mouth daily.   tamsulosin (FLOMAX) 0.4 MG CAPS capsule TAKE 1 CAPSULE EVERY DAY   Triamcinolone Acetonide (NASACORT AQ NA) Place into the nose daily.   No facility-administered medications prior to visit.    Review of Systems  Constitutional:  Negative for fatigue.  Eyes:  Negative for visual disturbance.  Respiratory:  Negative for chest tightness and shortness of breath.   Cardiovascular:  Negative for chest pain and leg swelling.  Gastrointestinal:  Negative for abdominal pain, nausea and vomiting.  Neurological:  Negative for dizziness, light-headedness and headaches.       Objective    BP (!) 140/68 (BP Location: Left Arm, Patient Position: Sitting, Cuff Size: Large)   Pulse (!) 53   Temp 97.9 F (36.6 C) (Temporal)   Resp 16   Wt 213 lb (96.6 kg)   SpO2 99%   BMI 28.89 kg/m  BP Readings from Last 3 Encounters:  09/07/22 (!) 140/68  08/18/22 (!) 157/88  05/04/22 130/72   Wt Readings from Last 3 Encounters:  09/07/22 213 lb  (96.6 kg)  05/04/22 217 lb (98.4 kg)  02/02/22 217 lb (98.4 kg)    Physical Exam  General appearance: Well developed, well nourished male, cooperative and in no acute distress Head: Normocephalic, without obvious abnormality, atraumatic Respiratory: Respirations even and unlabored, normal respiratory rate Extremities: All extremities are intact.  Skin: Skin color, texture, turgor normal. No rashes seen  Psych: Appropriate mood and affect. Neurologic: Mental status: Alert, oriented to person, place, and time, thought content appropriate.   Results for orders placed or performed in visit on 09/07/22  POCT glycosylated hemoglobin (Hb A1C)  Result Value Ref Range   Hemoglobin A1C 6.4 (A) 4.0 - 5.6 %   Est. average glucose Bld gHb Est-mCnc 137     Assessment & Plan     1. Type 2 diabetes mellitus with hyperlipidemia A1c well controlled.  Continue current medications.    2. Primary hypertension SBP consistently above goal. Will change losartan-hctz to valsartan-hctz before his next mail order refill is due at the end of March.   3. Hyperlipidemia, unspecified hyperlipidemia type He is  tolerating pravastatin well with no adverse effects.    4. Strain of neck muscle, initial encounter Did well with prn methocarbamol prescribed at orthopedics. He would like to have some on hand.  - methocarbamol (ROBAXIN) 500 MG tablet; Take 1 tablet (500 mg total) by mouth 2 (two) times daily.  Dispense: 20 tablet; Refill: 0       The entirety of the information documented in the History of Present Illness, Review of Systems and Physical Exam were personally obtained by me. Portions of this information were initially documented by the CMA and reviewed by me for thoroughness and accuracy.     Adrian Merry, MD  [facility] [phone] (phone) [phone] (fax)  [facility]

## 2022-09-07 ENCOUNTER — Encounter: Payer: Self-pay | Admitting: Family Medicine

## 2022-09-07 ENCOUNTER — Ambulatory Visit (INDEPENDENT_AMBULATORY_CARE_PROVIDER_SITE_OTHER): Payer: Medicare Other | Admitting: Family Medicine

## 2022-09-07 VITALS — BP 140/68 | HR 53 | Temp 97.9°F | Resp 16 | Wt 213.0 lb

## 2022-09-07 DIAGNOSIS — E1169 Type 2 diabetes mellitus with other specified complication: Secondary | ICD-10-CM

## 2022-09-07 DIAGNOSIS — E785 Hyperlipidemia, unspecified: Secondary | ICD-10-CM | POA: Diagnosis not present

## 2022-09-07 DIAGNOSIS — S161XXA Strain of muscle, fascia and tendon at neck level, initial encounter: Secondary | ICD-10-CM | POA: Diagnosis not present

## 2022-09-07 DIAGNOSIS — I10 Essential (primary) hypertension: Secondary | ICD-10-CM

## 2022-09-07 DIAGNOSIS — E119 Type 2 diabetes mellitus without complications: Secondary | ICD-10-CM

## 2022-09-07 LAB — POCT GLYCOSYLATED HEMOGLOBIN (HGB A1C)
Est. average glucose Bld gHb Est-mCnc: 137
Hemoglobin A1C: 6.4 % — AB (ref 4.0–5.6)

## 2022-09-07 MED ORDER — METHOCARBAMOL 500 MG PO TABS: 500.0000 mg | ORAL_TABLET | Freq: Two times a day (BID) | ORAL | 0 refills | Status: AC

## 2022-09-07 NOTE — Patient Instructions (Addendum)
Please review the attached list of medications and notify my office if there are any errors.   We're going to change the losartan-hctz to valsartan-hctz before your mail order pharmacy sends the next refill

## 2022-09-21 DIAGNOSIS — M7542 Impingement syndrome of left shoulder: Secondary | ICD-10-CM | POA: Diagnosis not present

## 2022-09-21 DIAGNOSIS — R252 Cramp and spasm: Secondary | ICD-10-CM | POA: Diagnosis not present

## 2022-09-21 DIAGNOSIS — M542 Cervicalgia: Secondary | ICD-10-CM | POA: Diagnosis not present

## 2022-11-03 ENCOUNTER — Other Ambulatory Visit: Payer: Self-pay | Admitting: Family Medicine

## 2022-11-03 DIAGNOSIS — I10 Essential (primary) hypertension: Secondary | ICD-10-CM

## 2022-11-03 DIAGNOSIS — G4700 Insomnia, unspecified: Secondary | ICD-10-CM

## 2022-11-03 NOTE — Telephone Encounter (Signed)
Requested Prescriptions  Pending Prescriptions Disp Refills   losartan-hydrochlorothiazide (HYZAAR) 100-25 MG tablet [Pharmacy Med Name: LOSARTAN POTASSIUM/HYDROCHLOROTHIAZIDE 100-25 MG Tablet] 90 tablet 0    Sig: TAKE 1 TABLET EVERY DAY     Cardiovascular: ARB + Diuretic Combos Failed - 11/03/2022  6:32 AM      Failed - K in normal range and within 180 days    Potassium  Date Value Ref Range Status  05/04/2022 3.6 3.5 - 5.2 mmol/L Final         Failed - Na in normal range and within 180 days    Sodium  Date Value Ref Range Status  05/04/2022 140 134 - 144 mmol/L Final         Failed - Cr in normal range and within 180 days    Creat  Date Value Ref Range Status  04/03/2017 0.93 0.70 - 1.18 mg/dL Final    Comment:    For patients >84 years of age, the reference limit for Creatinine is approximately 13% higher for people identified as African-American. .    Creatinine, Ser  Date Value Ref Range Status  05/04/2022 1.00 0.76 - 1.27 mg/dL Final   Creatinine, POC  Date Value Ref Range Status  10/03/2016 n/a mg/dL Final         Failed - eGFR is 10 or above and within 180 days    GFR, Est African American  Date Value Ref Range Status  04/03/2017 96 > OR = 60 mL/min/1.22m2 Final   GFR calc Af Amer  Date Value Ref Range Status  04/13/2020 88 >59 mL/min/1.73 Final    Comment:    **In accordance with recommendations from the NKF-ASN Task force,**   [facility] is in the process of updating its eGFR calculation to the   2021 CKD-EPI creatinine equation that estimates kidney function   without a race variable.    GFR, Est Non African American  Date Value Ref Range Status  04/03/2017 83 > OR = 60 mL/min/1.87m2 Final   GFR, Estimated  Date Value Ref Range Status  10/15/2020 >60 >60 mL/min Final    Comment:    (NOTE) Calculated using the CKD-EPI Creatinine Equation (2021)    eGFR  Date Value Ref Range Status  05/04/2022 78 >59 mL/min/1.73 Final         Failed - Last  BP in normal range    BP Readings from Last 1 Encounters:  09/07/22 (!) 140/68         Passed - Patient is not pregnant      Passed - Valid encounter within last 6 months    Recent Outpatient Visits           1 month ago Type 2 diabetes mellitus with hyperlipidemia (HCC)   [place] [facility] Sherrie Mustache, Demetrios Isaacs, MD   6 months ago Medicare annual wellness visit, subsequent   [facility] Malva Limes, MD   9 months ago Type 2 diabetes mellitus without complication, without long-term current use of insulin (HCC)   [facility] Malva Limes, MD   1 year ago Type 2 diabetes mellitus without complication, without long-term current use of insulin (HCC)   [facility] Malva Limes, MD   1 year ago Medicare annual wellness visit, subsequent   [facility] Malva Limes, MD       Future Appointments  In 2 months Fisher, Demetrios Isaacs, MD [facility]             atenolol (TENORMIN) 50 MG tablet [Pharmacy Med Name: ATENOLOL 50 MG Tablet] 90 tablet 0    Sig: TAKE 1 TABLET EVERY DAY     Cardiovascular: Beta Blockers 2 Failed - 11/03/2022  6:32 AM      Failed - Last BP in normal range    BP Readings from Last 1 Encounters:  09/07/22 (!) 140/68         Passed - Cr in normal range and within 360 days    Creat  Date Value Ref Range Status  04/03/2017 0.93 0.70 - 1.18 mg/dL Final    Comment:    For patients >52 years of age, the reference limit for Creatinine is approximately 13% higher for people identified as African-American. .    Creatinine, Ser  Date Value Ref Range Status  05/04/2022 1.00 0.76 - 1.27 mg/dL Final   Creatinine, POC  Date Value Ref Range Status  10/03/2016 n/a mg/dL Final         Passed - Last Heart Rate in normal range    Pulse Readings from Last 1 Encounters:  09/07/22 (!) 53          Passed - Valid encounter within last 6 months    Recent Outpatient Visits           1 month ago Type 2 diabetes mellitus with hyperlipidemia (HCC)   [facility] Sherrie Mustache, Demetrios Isaacs, MD   6 months ago Medicare annual wellness visit, subsequent   [facility] Malva Limes, MD   9 months ago Type 2 diabetes mellitus without complication, without long-term current use of insulin (HCC)   [facility] Malva Limes, MD   1 year ago Type 2 diabetes mellitus without complication, without long-term current use of insulin (HCC)   [place] [facility] Malva Limes, MD   1 year ago Medicare annual wellness visit, subsequent   [facility] Malva Limes, MD       Future Appointments             In 2 months Fisher, Demetrios Isaacs, MD [facility]             amitriptyline (ELAVIL) 25 MG tablet [Pharmacy Med Name: AMITRIPTYLINE HYDROCHLORIDE 25 MG Tablet] 90 tablet 0    Sig: TAKE 1 TABLET AT BEDTIME AS NEEDED FOR SLEEP     Psychiatry:  Antidepressants - Heterocyclics (TCAs) Passed - 11/03/2022  6:32 AM      Passed - Valid encounter within last 6 months    Recent Outpatient Visits           1 month ago Type 2 diabetes mellitus with hyperlipidemia (HCC)   [place] [facility] Malva Limes, MD   6 months ago Medicare annual wellness visit, subsequent   [facility] Malva Limes, MD   9 months ago Type 2 diabetes mellitus without complication, without long-term current use of insulin (HCC)   [facility] Malva Limes, MD   1 year ago Type 2 diabetes mellitus without complication, without long-term current use of insulin (HCC)   [place] [facility] Malva Limes, MD   1 year ago Medicare annual wellness visit, subsequent  [facility] Sherrie Mustache, Demetrios Isaacs, MD       Future Appointments             In 2 months Fisher, Demetrios Isaacs, MD [facility]

## 2022-11-08 ENCOUNTER — Encounter: Payer: Self-pay | Admitting: Family Medicine

## 2022-11-14 ENCOUNTER — Ambulatory Visit: Payer: Self-pay

## 2022-11-14 NOTE — Patient Outreach (Signed)
  Care Coordination   11/14/2022 Name: Adrian Thompson MRN: [id number] DOB: 10/10/1945   Care Coordination Outreach Attempts:  An unsuccessful telephone outreach was attempted today to offer the patient information about available care coordination services.  Follow Up Plan:  Additional outreach attempts will be made to offer the patient care coordination information and services.   Encounter Outcome:  No Answer   Care Coordination Interventions:  No, not indicated    SIG Lysle Morales, BSW Social Worker [facility] Management  [phone]

## 2022-11-25 ENCOUNTER — Ambulatory Visit: Payer: Self-pay

## 2022-11-25 NOTE — Patient Outreach (Signed)
  Care Coordination   Initial Visit Note   11/25/2022 Name: LARS HARTKOPF MRN: [id number] DOB: 1945-11-27  Gwynneth Aliment is a 77 y.o. year old male who sees Fisher, Demetrios Isaacs, MD for primary care. I spoke with  Gwynneth Aliment and his wife Darl Pikes by phone today.  What matters to the patients health and wellness today?  CM educated patient on [facility] services.  Patient declines services and feels that he is able to manage his medical needs.  Patient agreed to contact his provider in the future if he needs [facility] services.    Goals Addressed   None     SDOH assessments and interventions completed:  No     Care Coordination Interventions:  No, not indicated   Follow up plan: No further intervention required.   Encounter Outcome:  Pt. Refused

## 2023-01-04 DIAGNOSIS — H903 Sensorineural hearing loss, bilateral: Secondary | ICD-10-CM | POA: Diagnosis not present

## 2023-01-04 DIAGNOSIS — Z461 Encounter for fitting and adjustment of hearing aid: Secondary | ICD-10-CM | POA: Diagnosis not present

## 2023-01-04 DIAGNOSIS — H35033 Hypertensive retinopathy, bilateral: Secondary | ICD-10-CM | POA: Diagnosis not present

## 2023-01-04 LAB — HM DIABETES EYE EXAM

## 2023-01-09 ENCOUNTER — Ambulatory Visit (INDEPENDENT_AMBULATORY_CARE_PROVIDER_SITE_OTHER): Payer: Medicare Other | Admitting: Family Medicine

## 2023-01-09 VITALS — BP 138/84 | HR 54 | Ht 72.0 in | Wt 212.9 lb

## 2023-01-09 DIAGNOSIS — E1169 Type 2 diabetes mellitus with other specified complication: Secondary | ICD-10-CM

## 2023-01-09 DIAGNOSIS — G4700 Insomnia, unspecified: Secondary | ICD-10-CM | POA: Diagnosis not present

## 2023-01-09 DIAGNOSIS — N411 Chronic prostatitis: Secondary | ICD-10-CM | POA: Diagnosis not present

## 2023-01-09 DIAGNOSIS — E785 Hyperlipidemia, unspecified: Secondary | ICD-10-CM | POA: Diagnosis not present

## 2023-01-09 DIAGNOSIS — I700 Atherosclerosis of aorta: Secondary | ICD-10-CM

## 2023-01-09 DIAGNOSIS — Z23 Encounter for immunization: Secondary | ICD-10-CM | POA: Diagnosis not present

## 2023-01-09 DIAGNOSIS — I10 Essential (primary) hypertension: Secondary | ICD-10-CM | POA: Diagnosis not present

## 2023-01-09 LAB — POCT GLYCOSYLATED HEMOGLOBIN (HGB A1C)
Est. average glucose Bld gHb Est-mCnc: 143
Hemoglobin A1C: 6.6 % — AB (ref 4.0–5.6)

## 2023-01-09 MED ORDER — AMLODIPINE BESYLATE 10 MG PO TABS: 10.0000 mg | ORAL_TABLET | Freq: Every day | ORAL | 4 refills | Status: DC

## 2023-01-09 MED ORDER — ATENOLOL 50 MG PO TABS
50.0000 mg | ORAL_TABLET | Freq: Every day | ORAL | 3 refills | Status: DC
Start: 2023-01-09 — End: 2024-02-18

## 2023-01-09 MED ORDER — TAMSULOSIN HCL 0.4 MG PO CAPS
0.4000 mg | ORAL_CAPSULE | Freq: Every day | ORAL | 4 refills | Status: DC
Start: 2023-01-09 — End: 2024-04-03

## 2023-01-09 MED ORDER — VALSARTAN-HYDROCHLOROTHIAZIDE 160-25 MG PO TABS: 1.0000 | ORAL_TABLET | Freq: Every day | ORAL | 1 refills | Status: DC

## 2023-01-09 MED ORDER — AMITRIPTYLINE HCL 25 MG PO TABS
25.0000 mg | ORAL_TABLET | Freq: Every evening | ORAL | 3 refills | Status: DC | PRN
Start: 2023-01-09 — End: 2024-02-06

## 2023-01-09 NOTE — Patient Instructions (Addendum)
Please review the attached list of medications and notify my office if there are any errors.   I recommend getting the new Covid vaccine and the RSV vaccine around the third week of September at your pharmacy

## 2023-01-09 NOTE — Progress Notes (Signed)
Established patient visit   Patient: Adrian Thompson   DOB: 18-Mar-1946   77 y.o. Male  MRN: [id number] Visit Date: 01/09/2023  Today's healthcare provider: [name], MD   Chief Complaint  Patient presents with   Diabetes   Subjective    Follow up hypertension and dm. Feels well no complaints. No unusually high or low blood sugars. Tolerating meds without SE Lab Results  Component Value Date   HGBA1C 6.6 (A) 01/09/2023   HGBA1C 6.4 (A) 09/07/2022   HGBA1C 6.6 (H) 05/04/2022   Lab Results  Component Value Date   CHOL 140 05/04/2022   HDL 59 05/04/2022   LDLCALC 64 05/04/2022   TRIG 89 05/04/2022   CHOLHDL 2.4 05/04/2022   Lab Results  Component Value Date   NA 140 05/04/2022   K 3.6 05/04/2022   CREATININE 1.00 05/04/2022   EGFR 78 05/04/2022   GLUCOSE 155 (H) 05/04/2022     Medications: Outpatient Medications Prior to Visit  Medication Sig   ACCU-CHEK AVIVA PLUS test strip USE ONCE EVERY DAY AS DIRECTED   aspirin 81 MG tablet Take 1 tablet by mouth daily.   finasteride (PROSCAR) 5 MG tablet TAKE 1 TABLET EVERY DAY   glucosamine-chondroitin 500-400 MG tablet Take 1 tablet by mouth daily.   indomethacin (INDOCIN) 25 MG capsule Take 1 capsule (25 mg total) by mouth 3 (three) times daily with meals as needed. Up to 3 times a day   meloxicam (MOBIC) 15 MG tablet Take 1 tablet (15 mg total) by mouth daily as needed for pain. As needed   methocarbamol (ROBAXIN) 500 MG tablet Take 1 tablet (500 mg total) by mouth 2 (two) times daily.   Misc Natural Products (BLACK CHERRY CONCENTRATE PO) Take 1 tablet by mouth 2 (two) times daily.    montelukast (SINGULAIR) 10 MG tablet Take 1 tablet (10 mg total) by mouth daily.   Multiple Vitamins-Minerals (MULTIVITAMIN ADULT PO) Take 1 tablet by mouth daily.   naproxen (NAPROSYN) 500 MG tablet Take 1 tablet (500 mg total) by mouth 2 (two) times daily with a meal.   Omega-3 Fatty Acids (FISH OIL) 1000 MG CPDR Take 1 capsule by  mouth daily.   omeprazole (PRILOSEC) 20 MG capsule Take 1 capsule (20 mg total) by mouth daily.   potassium chloride SA (KLOR-CON M) 20 MEQ tablet TAKE 1 TABLET EVERY DAY   pravastatin (PRAVACHOL) 20 MG tablet Take 1 tablet (20 mg total) by mouth daily.   Triamcinolone Acetonide (NASACORT AQ NA) Place into the nose daily.   [DISCONTINUED] amitriptyline (ELAVIL) 25 MG tablet TAKE 1 TABLET AT BEDTIME AS NEEDED FOR SLEEP   [DISCONTINUED] amLODipine (NORVASC) 10 MG tablet TAKE 1 TABLET EVERY DAY   [DISCONTINUED] atenolol (TENORMIN) 50 MG tablet TAKE 1 TABLET EVERY DAY   [DISCONTINUED] losartan-hydrochlorothiazide (HYZAAR) 100-25 MG tablet TAKE 1 TABLET EVERY DAY   [DISCONTINUED] tamsulosin (FLOMAX) 0.4 MG CAPS capsule TAKE 1 CAPSULE EVERY DAY   No facility-administered medications prior to visit.    Review of Systems  Constitutional:  Negative for appetite change, chills and fever.  Respiratory:  Negative for chest tightness, shortness of breath and wheezing.   Cardiovascular:  Negative for chest pain and palpitations.  Gastrointestinal:  Negative for abdominal pain, nausea and vomiting.       Objective    BP 138/84 (BP Location: Right Arm, Patient Position: Sitting, Cuff Size: Normal)   Pulse (!) 54   Ht 6' (1.829 m)  Wt 212 lb 14.4 oz (96.6 kg)   SpO2 100%   BMI 28.87 kg/m    Physical Exam   General: Appearance:    Well developed, well nourished male in no acute distress  Eyes:    PERRL, conjunctiva/corneas clear, EOM's intact       Lungs:     Clear to auscultation bilaterally, respirations unlabored  Heart:    Bradycardic. Normal rhythm. No murmurs, rubs, or gallops.    MS:   All extremities are intact.    Neurologic:   Awake, alert, oriented x 3. No apparent focal neurological defect.         Results for orders placed or performed in visit on 01/09/23  POCT glycosylated hemoglobin (Hb A1C)  Result Value Ref Range   Hemoglobin A1C 6.6 (A) 4.0 - 5.6 %   Est. average  glucose Bld gHb Est-mCnc 143     Assessment & Plan     1. Type 2 diabetes mellitus with hyperlipidemia (HCC) Diet controlled.  - Urine Albumin/Creatinine with ratio (send out) [LAB689]  2. Primary hypertension Borderline control. Change losartan-hydrochlorothiazide to valsartan-hydrochlorothiazide (DIOVAN-HCT) 160-25 MG tablet; Take 1 tablet by mouth daily.  Dispense: 90 tablet; Refill: 1 Refill atenolol (TENORMIN) 50 MG tablet; Take 1 tablet (50 mg total) by mouth daily.  Dispense: 90 tablet; Refill: 3  refill amLODipine (NORVASC) 10 MG tablet; Take 1 tablet (10 mg total) by mouth daily.  Dispense: 90 tablet; Refill: 4  3. Chronic prostatitis refill tamsulosin (FLOMAX) 0.4 MG CAPS capsule; Take 1 capsule (0.4 mg total) by mouth daily.  Dispense: 90 capsule; Refill: 4  4. Aortic atherosclerosis (HCC) Asymptomatic. Compliant with medication.  Continue aggressive risk factor modification.   He is tolerating pravastatin well with no adverse effects.    5. Insomnia, unspecified type refill - amitriptyline (ELAVIL) 25 MG tablet; Take 1 tablet (25 mg total) by mouth at bedtime as needed. for sleep  Dispense: 90 tablet; Refill: 3  6. Flu vaccine need  - Flu Vaccine QUAD High Dose(Fluad)          [name], MD  [facility] [phone] (phone) [phone] (fax)  [facility]

## 2023-01-10 ENCOUNTER — Encounter: Payer: Self-pay | Admitting: Optometry

## 2023-01-10 LAB — MICROALBUMIN / CREATININE URINE RATIO
Creatinine, Urine: 106 mg/dL
Microalb/Creat Ratio: 12 mg/g{creat} (ref 0–29)
Microalbumin, Urine: 12.6 ug/mL

## 2023-01-13 DIAGNOSIS — D225 Melanocytic nevi of trunk: Secondary | ICD-10-CM | POA: Diagnosis not present

## 2023-01-13 DIAGNOSIS — L821 Other seborrheic keratosis: Secondary | ICD-10-CM | POA: Diagnosis not present

## 2023-02-08 DIAGNOSIS — Z23 Encounter for immunization: Secondary | ICD-10-CM | POA: Diagnosis not present

## 2023-02-13 ENCOUNTER — Encounter: Payer: Self-pay | Admitting: Family Medicine

## 2023-02-13 DIAGNOSIS — H903 Sensorineural hearing loss, bilateral: Secondary | ICD-10-CM | POA: Diagnosis not present

## 2023-03-29 ENCOUNTER — Other Ambulatory Visit: Payer: Self-pay | Admitting: Family Medicine

## 2023-03-29 DIAGNOSIS — N138 Other obstructive and reflux uropathy: Secondary | ICD-10-CM

## 2023-03-29 DIAGNOSIS — E876 Hypokalemia: Secondary | ICD-10-CM

## 2023-03-30 NOTE — Telephone Encounter (Signed)
Requested Prescriptions  Pending Prescriptions Disp Refills   potassium chloride SA (KLOR-CON M) 20 MEQ tablet [Pharmacy Med Name: Potassium Chloride Crys ER Oral Tablet Extended Release 20 MEQ] 90 tablet 0    Sig: TAKE 1 TABLET EVERY DAY     Endocrinology:  Minerals - Potassium Supplementation Passed - 03/29/2023  5:26 PM      Passed - K in normal range and within 360 days    Potassium  Date Value Ref Range Status  05/04/2022 3.6 3.5 - 5.2 mmol/L Final         Passed - Cr in normal range and within 360 days    Creat  Date Value Ref Range Status  04/03/2017 0.93 0.70 - 1.18 mg/dL Final    Comment:    For patients >72 years of age, the reference limit for Creatinine is approximately 13% higher for people identified as African-American. .    Creatinine, Ser  Date Value Ref Range Status  05/04/2022 1.00 0.76 - 1.27 mg/dL Final   Creatinine, POC  Date Value Ref Range Status  10/03/2016 n/a mg/dL Final         Passed - Valid encounter within last 12 months    Recent Outpatient Visits           2 months ago Type 2 diabetes mellitus with hyperlipidemia [facility])   [place] [facility] Malva Limes, MD   6 months ago Type 2 diabetes mellitus with hyperlipidemia [facility])   [place] [facility] Malva Limes, MD   11 months ago Medicare annual wellness visit, subsequent   [place] [facility] Malva Limes, MD   1 year ago Type 2 diabetes mellitus without complication, without long-term current use of insulin (HCC)   [place] [facility] Malva Limes, MD   1 year ago Type 2 diabetes mellitus without complication, without long-term current use of insulin (HCC)   [place] [facility] Sherrie Mustache, Demetrios Isaacs, MD               finasteride (PROSCAR) 5 MG tablet [Pharmacy Med Name: Finasteride Oral Tablet 5 MG] 90 tablet 0    Sig: TAKE 1 TABLET EVERY DAY     Urology: 5-alpha Reductase  Inhibitors Passed - 03/29/2023  5:26 PM      Passed - PSA in normal range and within 360 days    PSA  Date Value Ref Range Status  02/26/2014 4.2  Final   Prostate Specific Ag, Serum  Date Value Ref Range Status  05/04/2022 1.6 0.0 - 4.0 ng/mL Final    Comment:    Roche ECLIA methodology. According to the American Urological Association, Serum PSA should decrease and remain at undetectable levels after radical prostatectomy. The AUA defines biochemical recurrence as an initial PSA value 0.2 ng/mL or greater followed by a subsequent confirmatory PSA value 0.2 ng/mL or greater. Values obtained with different assay methods or kits cannot be used interchangeably. Results cannot be interpreted as absolute evidence of the presence or absence of malignant disease.          Passed - Valid encounter within last 12 months    Recent Outpatient Visits           2 months ago Type 2 diabetes mellitus with hyperlipidemia [facility])   [place] [facility] Malva Limes, MD   6 months ago Type 2 diabetes mellitus with hyperlipidemia [facility])    [facility] Malva Limes, MD   11 months ago Medicare annual wellness visit, subsequent   [place] [facility] Malva Limes, MD   1 year ago Type 2 diabetes mellitus without complication, without long-term current use of insulin [facility])   [place] [facility] Malva Limes, MD   1 year ago Type 2 diabetes mellitus without complication, without long-term current use of insulin [facility])   [place] [facility] Sherrie Mustache, Demetrios Isaacs, MD

## 2023-04-11 DIAGNOSIS — H903 Sensorineural hearing loss, bilateral: Secondary | ICD-10-CM | POA: Diagnosis not present

## 2023-05-10 ENCOUNTER — Ambulatory Visit: Payer: Medicare Other | Admitting: Family Medicine

## 2023-05-10 ENCOUNTER — Encounter: Payer: Self-pay | Admitting: Family Medicine

## 2023-05-10 DIAGNOSIS — E1169 Type 2 diabetes mellitus with other specified complication: Secondary | ICD-10-CM

## 2023-05-10 DIAGNOSIS — E785 Hyperlipidemia, unspecified: Secondary | ICD-10-CM

## 2023-05-10 DIAGNOSIS — Z125 Encounter for screening for malignant neoplasm of prostate: Secondary | ICD-10-CM | POA: Diagnosis not present

## 2023-05-10 DIAGNOSIS — I10 Essential (primary) hypertension: Secondary | ICD-10-CM | POA: Diagnosis not present

## 2023-05-10 DIAGNOSIS — G8929 Other chronic pain: Secondary | ICD-10-CM

## 2023-05-10 DIAGNOSIS — M25512 Pain in left shoulder: Secondary | ICD-10-CM | POA: Diagnosis not present

## 2023-05-10 LAB — POCT GLYCOSYLATED HEMOGLOBIN (HGB A1C): Hemoglobin A1C: 6.6 % — AB (ref 4.0–5.6)

## 2023-05-10 MED ORDER — MELOXICAM 15 MG PO TABS
15.0000 mg | ORAL_TABLET | Freq: Every day | ORAL | 2 refills | Status: AC | PRN
Start: 2023-05-10 — End: ?

## 2023-05-10 NOTE — Patient Instructions (Signed)
.   Please review the attached list of medications and notify my office if there are any errors.   . Please bring all of your medications to every appointment so we can make sure that our medication list is the same as yours.   

## 2023-05-11 ENCOUNTER — Other Ambulatory Visit: Payer: Self-pay | Admitting: Family Medicine

## 2023-05-11 DIAGNOSIS — I10 Essential (primary) hypertension: Secondary | ICD-10-CM

## 2023-05-11 LAB — CBC
Hematocrit: 45.5 % (ref 37.5–51.0)
Hemoglobin: 14.7 g/dL (ref 13.0–17.7)
MCH: 29.5 pg (ref 26.6–33.0)
MCHC: 32.3 g/dL (ref 31.5–35.7)
MCV: 91 fL (ref 79–97)
Platelets: 255 10*3/uL (ref 150–450)
RBC: 4.99 x10E6/uL (ref 4.14–5.80)
RDW: 12.7 % (ref 11.6–15.4)
WBC: 6.6 10*3/uL (ref 3.4–10.8)

## 2023-05-11 LAB — COMPREHENSIVE METABOLIC PANEL
ALT: 19 [IU]/L (ref 0–44)
AST: 19 [IU]/L (ref 0–40)
Albumin: 4.3 g/dL (ref 3.8–4.8)
Alkaline Phosphatase: 63 [IU]/L (ref 44–121)
BUN/Creatinine Ratio: 19 (ref 10–24)
BUN: 18 mg/dL (ref 8–27)
Bilirubin Total: 0.7 mg/dL (ref 0.0–1.2)
CO2: 25 mmol/L (ref 20–29)
Calcium: 9.6 mg/dL (ref 8.6–10.2)
Chloride: 101 mmol/L (ref 96–106)
Creatinine, Ser: 0.94 mg/dL (ref 0.76–1.27)
Globulin, Total: 2.4 g/dL (ref 1.5–4.5)
Glucose: 143 mg/dL — ABNORMAL HIGH (ref 70–99)
Potassium: 3.8 mmol/L (ref 3.5–5.2)
Sodium: 141 mmol/L (ref 134–144)
Total Protein: 6.7 g/dL (ref 6.0–8.5)
eGFR: 84 mL/min/{1.73_m2} (ref >59)

## 2023-05-11 LAB — LIPID PANEL
Chol/HDL Ratio: 2.3 {ratio} (ref 0.0–5.0)
Cholesterol, Total: 152 mg/dL (ref 100–199)
HDL: 65 mg/dL (ref >39)
LDL Chol Calc (NIH): 67 mg/dL (ref 0–99)
Triglycerides: 110 mg/dL (ref 0–149)
VLDL Cholesterol Cal: 20 mg/dL (ref 5–40)

## 2023-05-11 LAB — PSA: Prostate Specific Ag, Serum: 1.5 ng/mL (ref 0.0–4.0)

## 2023-05-11 LAB — TSH: TSH: 1.49 u[IU]/mL (ref 0.450–4.500)

## 2023-05-11 LAB — MICROALBUMIN / CREATININE URINE RATIO
Creatinine, Urine: 133.9 mg/dL
Microalb/Creat Ratio: 21 mg/g{creat} (ref 0–29)
Microalbumin, Urine: 28.2 ug/mL

## 2023-05-11 MED ORDER — VALSARTAN-HYDROCHLOROTHIAZIDE 320-25 MG PO TABS: 1.0000 | ORAL_TABLET | Freq: Every day | ORAL | 1 refills | Status: DC

## 2023-05-18 NOTE — Progress Notes (Signed)
Complete physical exam   Patient: Adrian Thompson   DOB: 07/30/1945   77 y.o. Male  MRN: [id number] Visit Date: 05/10/2023  Today's healthcare provider: [name], MD   No chief complaint on file.  Subjective    Adrian Thompson is a 77 y.o. male who presents today for a complete physical exam.  He reports consuming a  healthy  diet.  He generally feels well. He reports sleeping well.   Also follow up htn, diabetes, lipids. Doing well on current meds. C/o nasal congestion. Is taking Flonase. He reports home BP consistently in upper 130s and 140s.   He does continue to have OA pain in his hips for which meloxicam is effective.   He continues on tamsulosin for BPH which remains effective. Gets a little light headed when standing abruptly.   Past Medical History:  Diagnosis Date   Gout    History of chicken pox    History of measles    History of mumps    Past Surgical History:  Procedure Laterality Date   COCHLEAR IMPLANT Right 06/28/2016   Marrianne Mood, MD [facility]   COLONOSCOPY  2009   COLONOSCOPY WITH PROPOFOL N/A 04/04/2018   Procedure: COLONOSCOPY WITH PROPOFOL;  Surgeon: Earline Mayotte, MD;  Location: [facility] ENDOSCOPY;  Service: Endoscopy;  Laterality: N/A;   EXTERNAL EAR SURGERY  2018   IR RADIOLOGIST EVAL & MGMT  11/11/2020   IR RADIOLOGIST EVAL & MGMT  12/30/2020   TONSILLECTOMY     UPPER GI ENDOSCOPY  2009   UPPER GI ENDOSCOPY  03/04/2011   [facility]; Excision of multiple gastric polyps   Social History   Socioeconomic History   Marital status: Married    Spouse name: Not on file   Number of children: 0   Years of education: Not on file   Highest education level: 12th grade  Occupational History   Occupation: Retired  Tobacco Use   Smoking status: Never   Smokeless tobacco: Former    Types: Snuff, Designer, multimedia Use   Vaping status: Never Used  Substance and Sexual Activity   Alcohol use: No    Alcohol/week: 0.0 standard drinks of alcohol   Drug use:  No   Sexual activity: Not on file  Other Topics Concern   Not on file  Social History Narrative   Not on file   Social Drivers of Health   Financial Resource Strain: Low Risk  (01/08/2023)   Overall Financial Resource Strain (CARDIA)    Difficulty of Paying Living Expenses: Not hard at all  Food Insecurity: No Food Insecurity (01/08/2023)   Hunger Vital Sign    Worried About Running Out of Food in the Last Year: Never true    Ran Out of Food in the Last Year: Never true  Transportation Needs: No Transportation Needs (01/08/2023)   PRAPARE - Administrator, Civil Service (Medical): No    Lack of Transportation (Non-Medical): No  Physical Activity: Unknown (01/08/2023)   Exercise Vital Sign    Days of Exercise per Week: 0 days    Minutes of Exercise per Session: Not on file  Stress: No Stress Concern Present (01/08/2023)   [employer] of Occupational Health - Occupational Stress Questionnaire    Feeling of Stress : Not at all  Social Connections: Moderately Isolated (01/08/2023)   Social Connection and Isolation Panel [NHANES]    Frequency of Communication with Friends and Family: Never    Frequency  of Social Gatherings with Friends and Family: Twice a week    Attends Religious Services: 1 to 4 times per year    Active Member of [employer] or Organizations: No    Attends Engineer, structural: Not on file    Marital Status: Married  Catering manager Violence: Not At Risk (04/27/2020)   Humiliation, Afraid, Rape, and Kick questionnaire    Fear of Current or Ex-Partner: No    Emotionally Abused: No    Physically Abused: No    Sexually Abused: No   Family Status  Relation Name Status   Mother  Deceased at age 56       old age   Father  Deceased   Sister  Alive  No partnership data on file   Family History  Problem Relation Age of Onset   Hypertension Mother    Hypertension Sister    No Known Allergies  Patient Care Team: Malva Limes, MD as PCP -  General (Family Medicine) Lemar Livings, Merrily Pew, MD (General Surgery) Debbrah Alar, MD (Dermatology) Throat, [facility])   Medications: Outpatient Medications Prior to Visit  Medication Sig   ACCU-CHEK AVIVA PLUS test strip USE ONCE EVERY DAY AS DIRECTED   amitriptyline (ELAVIL) 25 MG tablet Take 1 tablet (25 mg total) by mouth at bedtime as needed. for sleep   amLODipine (NORVASC) 10 MG tablet Take 1 tablet (10 mg total) by mouth daily.   aspirin 81 MG tablet Take 1 tablet by mouth daily.   atenolol (TENORMIN) 50 MG tablet Take 1 tablet (50 mg total) by mouth daily.   finasteride (PROSCAR) 5 MG tablet TAKE 1 TABLET EVERY DAY   glucosamine-chondroitin 500-400 MG tablet Take 1 tablet by mouth daily.   indomethacin (INDOCIN) 25 MG capsule Take 1 capsule (25 mg total) by mouth 3 (three) times daily with meals as needed. Up to 3 times a day   methocarbamol (ROBAXIN) 500 MG tablet Take 1 tablet (500 mg total) by mouth 2 (two) times daily.   Misc Natural Products (BLACK CHERRY CONCENTRATE PO) Take 1 tablet by mouth 2 (two) times daily.    montelukast (SINGULAIR) 10 MG tablet Take 1 tablet (10 mg total) by mouth daily.   Multiple Vitamins-Minerals (MULTIVITAMIN ADULT PO) Take 1 tablet by mouth daily.   Omega-3 Fatty Acids (FISH OIL) 1000 MG CPDR Take 1 capsule by mouth daily.   omeprazole (PRILOSEC) 20 MG capsule Take 1 capsule (20 mg total) by mouth daily.   potassium chloride SA (KLOR-CON M) 20 MEQ tablet TAKE 1 TABLET EVERY DAY   pravastatin (PRAVACHOL) 20 MG tablet Take 1 tablet (20 mg total) by mouth daily.   tamsulosin (FLOMAX) 0.4 MG CAPS capsule Take 1 capsule (0.4 mg total) by mouth daily.   Triamcinolone Acetonide (NASACORT AQ NA) Place into the nose daily.   [DISCONTINUED] meloxicam (MOBIC) 15 MG tablet Take 1 tablet (15 mg total) by mouth daily as needed for pain. As needed   [DISCONTINUED] valsartan-hydrochlorothiazide (DIOVAN-HCT) 160-25  MG tablet Take 1 tablet by mouth daily.   [DISCONTINUED] naproxen (NAPROSYN) 500 MG tablet Take 1 tablet (500 mg total) by mouth 2 (two) times daily with a meal.   No facility-administered medications prior to visit.    A1c=6.6%  Objective       Physical Exam   General Appearance:    Well developed, well nourished male. Alert, cooperative, in no acute distress, appears stated age  Head:  Normocephalic, without obvious abnormality, atraumatic  Eyes:    PERRL, conjunctiva/corneas clear, EOM's intact, fundi    benign, both eyes       Ears:    Normal TM's and external ear canals, both ears  Nose:   Nares normal, septum midline, mucosa normal, no drainage   or sinus tenderness  Throat:   Lips, mucosa, and tongue normal; teeth and gums normal  Neck:   Supple, symmetrical, trachea midline, no adenopathy;       thyroid:  No enlargement/tenderness/nodules; no carotid   bruit or JVD  Back:     Symmetric, no curvature, ROM normal, no CVA tenderness  Lungs:     Clear to auscultation bilaterally, respirations unlabored  Chest wall:    No tenderness or deformity  Heart:    Normal heart rate. Normal rhythm. No murmurs, rubs, or gallops.  S1 and S2 normal  Abdomen:     Soft, non-tender, bowel sounds active all four quadrants,    no masses, no organomegaly  Genitalia:    deferred  Rectal:    deferred  Extremities:   All extremities are intact. No cyanosis or edema  Pulses:   2+ and symmetric all extremities  Skin:   Skin color, texture, turgor normal, no rashes or lesions  Lymph nodes:   Cervical, supraclavicular, and axillary nodes normal  Neurologic:   CNII-XII intact. Normal strength, sensation and reflexes      throughout     Last depression screening scores    05/10/2023    8:29 AM 01/09/2023    9:44 AM 09/07/2022    9:46 AM  PHQ 2/9 Scores  PHQ - 2 Score 0 0 0  PHQ- 9 Score   1   Last fall risk screening    05/10/2023    8:29 AM  Fall Risk   Falls in the past year? 0   Number falls in past yr: 0  Injury with Fall? 0   Last Audit-C alcohol use screening    01/08/2023    7:34 PM  Alcohol Use Disorder Test (AUDIT)  1. How often do you have a drink containing alcohol? 0  3. How often do you have six or more drinks on one occasion? 0   A score of 3 or more in women, and 4 or more in men indicates increased risk for alcohol abuse, EXCEPT if all of the points are from question 1     Assessment & Plan    Routine Health Maintenance and Physical Exam  Exercise Activities and Dietary recommendations  Goals      Exercise 3x per week (30 min per time)     Recommend to start exercising three times a week for 30 minutes.          Immunization History  Administered Date(s) Administered   Fluad Quad(high Dose 65+) 02/23/2021, 02/22/2022, 01/09/2023   Influenza, High Dose Seasonal PF 03/02/2015, 03/07/2016, 02/23/2018   Influenza-Unspecified 03/09/2017, 02/13/2019, 02/15/2020   PFIZER Comirnaty(Gray Top)Covid-19 Tri-Sucrose Vaccine 02/08/2023   PFIZER(Purple Top)SARS-COV-2 Vaccination 07/02/2019, 07/23/2019, 02/15/2020   Pfizer Covid-19 Vaccine Bivalent Booster 68yrs & up 09/29/2020, 02/23/2021   Pfizer(Comirnaty)Fall Seasonal Vaccine 12 years and older 02/22/2022   Pneumococcal Conjugate-13 02/26/2014   Pneumococcal Polysaccharide-23 02/25/2013   Respiratory Syncytial Virus Vaccine,Recomb Aduvanted(Arexvy) 03/09/2022   Td 03/20/2001   Tdap 02/14/2011, 05/12/2021   Zoster Recombinant(Shingrix) 08/31/2017, 11/08/2017   Zoster, Live 06/19/2007    Health Maintenance  Topic Date Due   COVID-19 Vaccine (8 - 2024-25  season) 04/05/2023   Medicare Annual Wellness (AWV)  05/05/2023   HEMOGLOBIN A1C  11/08/2023   OPHTHALMOLOGY EXAM  01/04/2024   Diabetic kidney evaluation - eGFR measurement  05/09/2024   Diabetic kidney evaluation - Urine ACR  05/09/2024   Colonoscopy  04/04/2025   DTaP/Tdap/Td (4 - Td or Tdap) 05/13/2031   Pneumonia Vaccine 65+ Years  old  Completed   INFLUENZA VACCINE  Completed   Zoster Vaccines- Shingrix  Completed   Hepatitis C Screening  Addressed   HPV VACCINES  Aged Out    Discussed health benefits of physical activity, and encouraged him to engage in regular exercise appropriate for his age and condition.  1. Chronic left shoulder pain refill meloxicam (MOBIC) 15 MG tablet; Take 1 tablet (15 mg total) by mouth daily as needed for pain. As needed  Dispense: 90 tablet; Refill: 2  2. Primary hypertension Home Bps not at goal. Consider increasing valsartan-hydrochlorothiazide after reviewing labs.  - CBC  3. Type 2 diabetes mellitus with hyperlipidemia (HCC) (Primary) A1c well controlled.  - Urine microalbumin-creatinine with uACR  4. Hyperlipidemia, unspecified hyperlipidemia type He is tolerating pravastatin well with no adverse effects.   - Comprehensive metabolic panel - Lipid panel - TSH  5. Prostate cancer screening  - PSA         [name], MD  [facility] [phone] (phone) [phone] (fax)  [facility]

## 2023-06-19 ENCOUNTER — Ambulatory Visit (INDEPENDENT_AMBULATORY_CARE_PROVIDER_SITE_OTHER): Payer: Medicare Other | Admitting: Family Medicine

## 2023-06-19 ENCOUNTER — Other Ambulatory Visit: Payer: Self-pay | Admitting: Family Medicine

## 2023-06-19 VITALS — BP 143/78 | HR 59 | Resp 16 | Ht 72.0 in | Wt 214.8 lb

## 2023-06-19 DIAGNOSIS — M25552 Pain in left hip: Secondary | ICD-10-CM

## 2023-06-19 DIAGNOSIS — I10 Essential (primary) hypertension: Secondary | ICD-10-CM | POA: Diagnosis not present

## 2023-06-19 DIAGNOSIS — R0981 Nasal congestion: Secondary | ICD-10-CM | POA: Diagnosis not present

## 2023-06-19 DIAGNOSIS — K219 Gastro-esophageal reflux disease without esophagitis: Secondary | ICD-10-CM

## 2023-06-19 DIAGNOSIS — E785 Hyperlipidemia, unspecified: Secondary | ICD-10-CM | POA: Diagnosis not present

## 2023-06-19 DIAGNOSIS — E876 Hypokalemia: Secondary | ICD-10-CM

## 2023-06-19 MED ORDER — OMEPRAZOLE 20 MG PO CPDR: 20.0000 mg | DELAYED_RELEASE_CAPSULE | Freq: Every day | ORAL | 4 refills | Status: AC

## 2023-06-19 MED ORDER — PRAVASTATIN SODIUM 20 MG PO TABS: 20.0000 mg | ORAL_TABLET | Freq: Every day | ORAL | 4 refills | Status: AC

## 2023-06-19 NOTE — Progress Notes (Signed)
Established patient visit   Patient: Adrian Thompson   DOB: 01/15/1946   78 y.o. Male  MRN: [id number] Visit Date: 06/19/2023  Today's healthcare provider: [name], MD   Chief Complaint  Patient presents with   Hypertension   Subjective    Discussed the use of AI scribe software for clinical note transcription with the patient, who gave verbal consent to proceed.  History of Present Illness   Patient presented for a follow-up visit after increasing valsartan-hctz to 320-25 about three weeks. He reports SBP at home has dropped about 10 points, now mostly in the the 130s and 140s, with occasional readings reaching 150. He has tolerated the higher dose of medication well.  In addition to hypertension, he has been experiencing sinus issues, which began around [place]. Symptoms included sneezing and a runny nose, which progressed to the expulsion of 'bad looking stuff' from the nose. He self-medicated with quercetin for a week and Singulair for two weeks, which alleviated the symptoms, but he still experiences some nasal congestion and occasional bloody discharge. He uses a saline nose spray in the morning and another unspecified nasal spray at night.  He also reported ongoing hip pain, which radiates down his leg to the ankle. The pain is particularly noticeable when lying on his side, and he is considering seeking orthopedic consultation for this issue.       Medications: Outpatient Medications Prior to Visit  Medication Sig   ACCU-CHEK AVIVA PLUS test strip USE ONCE EVERY DAY AS DIRECTED   amitriptyline (ELAVIL) 25 MG tablet Take 1 tablet (25 mg total) by mouth at bedtime as needed. for sleep   amLODipine (NORVASC) 10 MG tablet Take 1 tablet (10 mg total) by mouth daily.   atenolol (TENORMIN) 50 MG tablet Take 1 tablet (50 mg total) by mouth daily.   finasteride (PROSCAR) 5 MG tablet TAKE 1 TABLET EVERY DAY   glucosamine-chondroitin 500-400 MG tablet Take 1 tablet by mouth  daily.   indomethacin (INDOCIN) 25 MG capsule Take 1 capsule (25 mg total) by mouth 3 (three) times daily with meals as needed. Up to 3 times a day   meloxicam (MOBIC) 15 MG tablet Take 1 tablet (15 mg total) by mouth daily as needed for pain. As needed   methocarbamol (ROBAXIN) 500 MG tablet Take 1 tablet (500 mg total) by mouth 2 (two) times daily.   Misc Natural Products (BLACK CHERRY CONCENTRATE PO) Take 1 tablet by mouth 2 (two) times daily.    montelukast (SINGULAIR) 10 MG tablet Take 1 tablet (10 mg total) by mouth daily.   Multiple Vitamins-Minerals (MULTIVITAMIN ADULT PO) Take 1 tablet by mouth daily.   omeprazole (PRILOSEC) 20 MG capsule Take 1 capsule (20 mg total) by mouth daily.   potassium chloride SA (KLOR-CON M) 20 MEQ tablet TAKE 1 TABLET EVERY DAY   pravastatin (PRAVACHOL) 20 MG tablet Take 1 tablet (20 mg total) by mouth daily.   tamsulosin (FLOMAX) 0.4 MG CAPS capsule Take 1 capsule (0.4 mg total) by mouth daily.   Triamcinolone Acetonide (NASACORT AQ NA) Place into the nose daily.   valsartan-hydrochlorothiazide (DIOVAN-HCT) 320-25 MG tablet Take 1 tablet by mouth daily.   [DISCONTINUED] aspirin 81 MG tablet Take 1 tablet by mouth daily.   [DISCONTINUED] Omega-3 Fatty Acids (FISH OIL) 1000 MG CPDR Take 1 capsule by mouth daily.   No facility-administered medications prior to visit.   Review of Systems     Objective  BP (!) 143/78   Pulse (!) 59   Resp 16   Ht 6' (1.829 m)   Wt 214 lb 12.8 oz (97.4 kg)   SpO2 99%   BMI 29.13 kg/m   Physical Exam  Awake, alert, oriented x 3. In no apparent distress General appearance: Well developed, well nourished male, cooperative and in no acute distress Head: Normocephalic, without obvious abnormality, atraumatic Respiratory: Respirations even and unlabored, normal respiratory rate Extremities: All extremities are intact.  Skin: Skin color, texture, turgor normal. No rashes seen  Psych: Appropriate mood and  affect. Neurologic: Mental status: Alert, oriented to person, place, and time, thought content appropriate.     Assessment & Plan    1. Primary hypertension (Primary) Improved with increased dose of valsartan-hctz - Renal function panel  2. Hypokalemia Consider d/c potassium supplement after review lab results.   3. Nasal congestion Continue nasal saline and humidify air. Not c/with active infection.   4. Left hip pain Follow up with [facility].   5. Gastroesophageal reflux disease, unspecified whether esophagitis present Well controlled.   Requested Prescriptions   Signed Prescriptions Disp Refills   pravastatin (PRAVACHOL) 20 MG tablet 90 tablet 4    Sig: Take 1 tablet (20 mg total) by mouth daily.   omeprazole (PRILOSEC) 20 MG capsule 90 capsule 4    Sig: Take 1 capsule (20 mg total) by mouth daily.   - omeprazole (PRILOSEC) 20 MG capsule; Take 1 capsule (20 mg total) by mouth daily.  Dispense: 90 capsule; Refill: 4  6. Hyperlipidemia, unspecified hyperlipidemia type refill - pravastatin (PRAVACHOL) 20 MG tablet; Take 1 tablet (20 mg total) by mouth daily.  Dispense: 90 tablet; Refill: 4    General Health Maintenance Discussed risk versus benefit for use of fish oil, multivitamin, and aspirin. -Discontinue fish oil and aspirin. -Continue multivitamin. -Check in 5 months (June 2025) if lab results are satisfactory.         [name], MD  [facility] [phone] (phone) [phone] (fax)  [facility]

## 2023-06-19 NOTE — Patient Instructions (Signed)
Marland Kitchen  Please review the attached list of medications and notify my office if there are any errors.   . Please bring all of your medications to every appointment so we can make sure that our medication list is the same as yours.

## 2023-06-20 ENCOUNTER — Encounter: Payer: Self-pay | Admitting: Family Medicine

## 2023-06-20 DIAGNOSIS — E876 Hypokalemia: Secondary | ICD-10-CM

## 2023-06-20 LAB — RENAL FUNCTION PANEL
Albumin: 4.4 g/dL (ref 3.8–4.8)
BUN/Creatinine Ratio: 15 (ref 10–24)
BUN: 14 mg/dL (ref 8–27)
CO2: 23 mmol/L (ref 20–29)
Calcium: 9.3 mg/dL (ref 8.6–10.2)
Chloride: 102 mmol/L (ref 96–106)
Creatinine, Ser: 0.91 mg/dL (ref 0.76–1.27)
Glucose: 155 mg/dL — ABNORMAL HIGH (ref 70–99)
Phosphorus: 2.9 mg/dL (ref 2.8–4.1)
Potassium: 3.7 mmol/L (ref 3.5–5.2)
Sodium: 144 mmol/L (ref 134–144)
eGFR: 87 mL/min/{1.73_m2} (ref >59)

## 2023-06-22 DIAGNOSIS — M1612 Unilateral primary osteoarthritis, left hip: Secondary | ICD-10-CM | POA: Diagnosis not present

## 2023-06-22 DIAGNOSIS — M5416 Radiculopathy, lumbar region: Secondary | ICD-10-CM | POA: Diagnosis not present

## 2023-06-22 MED ORDER — ACCU-CHEK AVIVA PLUS VI STRP: ORAL_STRIP | 3 refills | Status: DC

## 2023-06-22 MED ORDER — POTASSIUM CHLORIDE CRYS ER 20 MEQ PO TBCR: 20.0000 meq | EXTENDED_RELEASE_TABLET | Freq: Every day | ORAL | 4 refills | Status: AC

## 2023-06-25 ENCOUNTER — Other Ambulatory Visit: Payer: Self-pay | Admitting: Family Medicine

## 2023-06-25 DIAGNOSIS — E1169 Type 2 diabetes mellitus with other specified complication: Secondary | ICD-10-CM

## 2023-06-25 MED ORDER — ACCU-CHEK AVIVA PLUS VI STRP: ORAL_STRIP | 3 refills | Status: DC

## 2023-06-30 DIAGNOSIS — M5416 Radiculopathy, lumbar region: Secondary | ICD-10-CM | POA: Diagnosis not present

## 2023-06-30 DIAGNOSIS — M5451 Vertebrogenic low back pain: Secondary | ICD-10-CM | POA: Diagnosis not present

## 2023-06-30 DIAGNOSIS — M25552 Pain in left hip: Secondary | ICD-10-CM | POA: Diagnosis not present

## 2023-07-03 DIAGNOSIS — M5451 Vertebrogenic low back pain: Secondary | ICD-10-CM | POA: Diagnosis not present

## 2023-07-03 DIAGNOSIS — M25552 Pain in left hip: Secondary | ICD-10-CM | POA: Diagnosis not present

## 2023-07-03 DIAGNOSIS — M5416 Radiculopathy, lumbar region: Secondary | ICD-10-CM | POA: Diagnosis not present

## 2023-07-05 DIAGNOSIS — H903 Sensorineural hearing loss, bilateral: Secondary | ICD-10-CM | POA: Diagnosis not present

## 2023-07-07 DIAGNOSIS — M5416 Radiculopathy, lumbar region: Secondary | ICD-10-CM | POA: Diagnosis not present

## 2023-07-07 DIAGNOSIS — M5451 Vertebrogenic low back pain: Secondary | ICD-10-CM | POA: Diagnosis not present

## 2023-07-07 DIAGNOSIS — M25552 Pain in left hip: Secondary | ICD-10-CM | POA: Diagnosis not present

## 2023-07-17 DIAGNOSIS — M5416 Radiculopathy, lumbar region: Secondary | ICD-10-CM | POA: Diagnosis not present

## 2023-07-17 DIAGNOSIS — M1612 Unilateral primary osteoarthritis, left hip: Secondary | ICD-10-CM | POA: Diagnosis not present

## 2023-07-19 DIAGNOSIS — M25552 Pain in left hip: Secondary | ICD-10-CM | POA: Diagnosis not present

## 2023-08-09 ENCOUNTER — Ambulatory Visit: Payer: Medicare Other | Admitting: Family Medicine

## 2023-08-09 DIAGNOSIS — M1712 Unilateral primary osteoarthritis, left knee: Secondary | ICD-10-CM | POA: Diagnosis not present

## 2023-08-09 DIAGNOSIS — Z0189 Encounter for other specified special examinations: Secondary | ICD-10-CM | POA: Diagnosis not present

## 2023-08-10 ENCOUNTER — Encounter: Payer: Self-pay | Admitting: Family Medicine

## 2023-08-13 DIAGNOSIS — Z23 Encounter for immunization: Secondary | ICD-10-CM | POA: Diagnosis not present

## 2023-08-14 ENCOUNTER — Other Ambulatory Visit: Payer: Self-pay | Admitting: Family Medicine

## 2023-08-14 DIAGNOSIS — N401 Enlarged prostate with lower urinary tract symptoms: Secondary | ICD-10-CM

## 2023-09-04 DIAGNOSIS — Z01818 Encounter for other preprocedural examination: Secondary | ICD-10-CM | POA: Diagnosis not present

## 2023-09-04 DIAGNOSIS — Z01812 Encounter for preprocedural laboratory examination: Secondary | ICD-10-CM | POA: Diagnosis not present

## 2023-09-04 DIAGNOSIS — Z0189 Encounter for other specified special examinations: Secondary | ICD-10-CM | POA: Diagnosis not present

## 2023-09-11 ENCOUNTER — Other Ambulatory Visit: Payer: Self-pay | Admitting: Family Medicine

## 2023-09-11 DIAGNOSIS — I10 Essential (primary) hypertension: Secondary | ICD-10-CM

## 2023-10-19 DIAGNOSIS — K219 Gastro-esophageal reflux disease without esophagitis: Secondary | ICD-10-CM | POA: Diagnosis not present

## 2023-10-19 DIAGNOSIS — M1612 Unilateral primary osteoarthritis, left hip: Secondary | ICD-10-CM | POA: Diagnosis not present

## 2023-10-19 DIAGNOSIS — Z6828 Body mass index (BMI) 28.0-28.9, adult: Secondary | ICD-10-CM | POA: Diagnosis not present

## 2023-10-19 DIAGNOSIS — I10 Essential (primary) hypertension: Secondary | ICD-10-CM | POA: Diagnosis not present

## 2023-10-19 DIAGNOSIS — E669 Obesity, unspecified: Secondary | ICD-10-CM | POA: Diagnosis not present

## 2023-10-19 DIAGNOSIS — M25752 Osteophyte, left hip: Secondary | ICD-10-CM | POA: Diagnosis not present

## 2023-10-19 DIAGNOSIS — M109 Gout, unspecified: Secondary | ICD-10-CM | POA: Diagnosis not present

## 2023-10-19 DIAGNOSIS — E7800 Pure hypercholesterolemia, unspecified: Secondary | ICD-10-CM | POA: Diagnosis not present

## 2023-10-19 DIAGNOSIS — N400 Enlarged prostate without lower urinary tract symptoms: Secondary | ICD-10-CM | POA: Diagnosis not present

## 2023-10-19 DIAGNOSIS — Z7982 Long term (current) use of aspirin: Secondary | ICD-10-CM | POA: Diagnosis not present

## 2023-10-19 HISTORY — PX: HIP ARTHROPLASTY: SHX981

## 2023-10-20 DIAGNOSIS — M109 Gout, unspecified: Secondary | ICD-10-CM | POA: Diagnosis not present

## 2023-10-20 DIAGNOSIS — M25752 Osteophyte, left hip: Secondary | ICD-10-CM | POA: Diagnosis not present

## 2023-10-20 DIAGNOSIS — I10 Essential (primary) hypertension: Secondary | ICD-10-CM | POA: Diagnosis not present

## 2023-10-20 DIAGNOSIS — K219 Gastro-esophageal reflux disease without esophagitis: Secondary | ICD-10-CM | POA: Diagnosis not present

## 2023-10-20 DIAGNOSIS — M1612 Unilateral primary osteoarthritis, left hip: Secondary | ICD-10-CM | POA: Diagnosis not present

## 2023-10-20 DIAGNOSIS — E7800 Pure hypercholesterolemia, unspecified: Secondary | ICD-10-CM | POA: Diagnosis not present

## 2023-10-26 DIAGNOSIS — M25552 Pain in left hip: Secondary | ICD-10-CM | POA: Diagnosis not present

## 2023-10-26 DIAGNOSIS — M25652 Stiffness of left hip, not elsewhere classified: Secondary | ICD-10-CM | POA: Diagnosis not present

## 2023-10-31 DIAGNOSIS — M25552 Pain in left hip: Secondary | ICD-10-CM | POA: Diagnosis not present

## 2023-10-31 DIAGNOSIS — M25652 Stiffness of left hip, not elsewhere classified: Secondary | ICD-10-CM | POA: Diagnosis not present

## 2023-11-07 DIAGNOSIS — M25552 Pain in left hip: Secondary | ICD-10-CM | POA: Diagnosis not present

## 2023-11-07 DIAGNOSIS — M25652 Stiffness of left hip, not elsewhere classified: Secondary | ICD-10-CM | POA: Diagnosis not present

## 2023-11-10 DIAGNOSIS — M25552 Pain in left hip: Secondary | ICD-10-CM | POA: Diagnosis not present

## 2023-11-10 DIAGNOSIS — M25652 Stiffness of left hip, not elsewhere classified: Secondary | ICD-10-CM | POA: Diagnosis not present

## 2023-11-13 ENCOUNTER — Ambulatory Visit: Payer: Self-pay | Admitting: Family Medicine

## 2023-11-14 DIAGNOSIS — M25552 Pain in left hip: Secondary | ICD-10-CM | POA: Diagnosis not present

## 2023-11-14 DIAGNOSIS — M25652 Stiffness of left hip, not elsewhere classified: Secondary | ICD-10-CM | POA: Diagnosis not present

## 2023-11-17 DIAGNOSIS — M25652 Stiffness of left hip, not elsewhere classified: Secondary | ICD-10-CM | POA: Diagnosis not present

## 2023-11-17 DIAGNOSIS — M25552 Pain in left hip: Secondary | ICD-10-CM | POA: Diagnosis not present

## 2023-11-20 DIAGNOSIS — M25552 Pain in left hip: Secondary | ICD-10-CM | POA: Diagnosis not present

## 2023-11-20 DIAGNOSIS — M25652 Stiffness of left hip, not elsewhere classified: Secondary | ICD-10-CM | POA: Diagnosis not present

## 2023-11-27 ENCOUNTER — Ambulatory Visit: Admitting: Family Medicine

## 2023-11-27 ENCOUNTER — Encounter: Payer: Self-pay | Admitting: Family Medicine

## 2023-11-27 VITALS — BP 119/79 | HR 71 | Temp 97.9°F | Ht 73.0 in | Wt 213.8 lb

## 2023-11-27 DIAGNOSIS — I10 Essential (primary) hypertension: Secondary | ICD-10-CM | POA: Diagnosis not present

## 2023-11-27 DIAGNOSIS — E785 Hyperlipidemia, unspecified: Secondary | ICD-10-CM

## 2023-11-27 DIAGNOSIS — Z9889 Other specified postprocedural states: Secondary | ICD-10-CM | POA: Insufficient documentation

## 2023-11-27 DIAGNOSIS — Z96642 Presence of left artificial hip joint: Secondary | ICD-10-CM | POA: Diagnosis not present

## 2023-11-27 DIAGNOSIS — E1169 Type 2 diabetes mellitus with other specified complication: Secondary | ICD-10-CM | POA: Diagnosis not present

## 2023-11-27 DIAGNOSIS — Z9089 Acquired absence of other organs: Secondary | ICD-10-CM | POA: Insufficient documentation

## 2023-11-27 NOTE — Progress Notes (Signed)
 Established patient visit   Patient: Adrian Thompson   DOB: 27-Dec-1945   78 y.o. Male  MRN: [id number] Visit Date: 11/27/2023  Today's healthcare provider: Nancyann Perry, MD   Chief Complaint  Patient presents with   Medical Management of Chronic Issues    Follow up HTN, HLD, T2DM   Subjective    Discussed the use of AI scribe software for clinical note transcription with the patient, who gave verbal consent to proceed.  History of Present Illness   The patient presents for a follow-up visit for hypertension and diabetes since increasing dose of valsartan -hctz in December.   He reports he is now five weeks post-operation from a hip replacement surgery performed on Oct 19, 2023. Since the surgery, he has experienced elevated blood sugar levels, with readings sometimes exceeding 300 mg/dL. Recently, his blood sugar has decreased, with a reading of 122 mg/dL this morning and typically ranging in the 130s to 150s over the past two weeks.  Prior to the surgery, he received a cortisone shot in March 2025, which temporarily alleviated pain radiating down his leg but was not a permanent solution.     BP Readings from Last 3 Encounters:  11/27/23 119/79  06/19/23 (!) 143/78  01/09/23 138/84   Lab Results  Component Value Date   NA 144 06/19/2023   K 3.7 06/19/2023   CREATININE 0.91 06/19/2023   EGFR 87 06/19/2023   GLUCOSE 155 (H) 06/19/2023   Lab Results  Component Value Date   HGBA1C 6.6 (A) 05/10/2023   HGBA1C 6.6 (A) 01/09/2023   HGBA1C 6.4 (A) 09/07/2022     Medications: Outpatient Medications Prior to Visit  Medication Sig   amitriptyline  (ELAVIL ) 25 MG tablet Take 1 tablet (25 mg total) by mouth at bedtime as needed. for sleep   amLODipine  (NORVASC ) 10 MG tablet Take 1 tablet (10 mg total) by mouth daily.   atenolol  (TENORMIN ) 50 MG tablet Take 1 tablet (50 mg total) by mouth daily.   finasteride  (PROSCAR ) 5 MG tablet TAKE 1 TABLET EVERY DAY    glucosamine-chondroitin 500-400 MG tablet Take 1 tablet by mouth daily.   glucose blood (ACCU-CHEK AVIVA PLUS) test strip Use as instructed   indomethacin  (INDOCIN ) 25 MG capsule Take 1 capsule (25 mg total) by mouth 3 (three) times daily with meals as needed. Up to 3 times a day   meloxicam  (MOBIC ) 15 MG tablet Take 1 tablet (15 mg total) by mouth daily as needed for pain. As needed   methocarbamol  (ROBAXIN ) 500 MG tablet Take 1 tablet (500 mg total) by mouth 2 (two) times daily.   Misc Natural Products (BLACK CHERRY CONCENTRATE PO) Take 1 tablet by mouth 2 (two) times daily.    montelukast  (SINGULAIR ) 10 MG tablet Take 1 tablet (10 mg total) by mouth daily.   Multiple Vitamins-Minerals (MULTIVITAMIN ADULT PO) Take 1 tablet by mouth daily.   omeprazole  (PRILOSEC) 20 MG capsule Take 1 capsule (20 mg total) by mouth daily.   potassium chloride  SA (KLOR-CON  M) 20 MEQ tablet Take 1 tablet (20 mEq total) by mouth daily.   pravastatin  (PRAVACHOL ) 20 MG tablet Take 1 tablet (20 mg total) by mouth daily.   tamsulosin  (FLOMAX ) 0.4 MG CAPS capsule Take 1 capsule (0.4 mg total) by mouth daily.   Triamcinolone  Acetonide (NASACORT AQ NA) Place into the nose daily.   valsartan -hydrochlorothiazide  (DIOVAN -HCT) 320-25 MG tablet TAKE 1 TABLET EVERY DAY. NEW DOSE   methocarbamol  (ROBAXIN ) 500 MG  tablet Take 500 mg by mouth 4 (four) times daily.   No facility-administered medications prior to visit.        Objective    BP 119/79 (BP Location: Left Arm, Patient Position: Sitting, Cuff Size: Normal)   Pulse 71   Temp 97.9 F (36.6 C) (Oral)   Ht 6' 1 (1.854 m)   Wt 213 lb 12.8 oz (97 kg)   SpO2 99%   BMI 28.21 kg/m   Physical Exam   General: Appearance:    Well developed, well nourished male in no acute distress  Eyes:    PERRL, conjunctiva/corneas clear, EOM's intact       Lungs:     Clear to auscultation bilaterally, respirations unlabored  Heart:    Normal heart rate. Normal rhythm. No murmurs,  rubs, or gallops.    MS:   All extremities are intact.    Neurologic:   Awake, alert, oriented x 3. No apparent focal neurological defect.         Assessment & Plan    1. Primary hypertension (Primary) Much better since increasing valsartan -hydrochlorothiazide  in December  - Renal function panel  2. Type 2 diabetes mellitus with hyperlipidemia (HCC) Diet controlled. Sugars were running much higher around the time of his hip replacement surgery in May, so expect a bit of increase in A1c.  - HgB A1c      Nancyann Perry, MD  [facility] [phone] (phone) [phone] (fax)  [facility]

## 2023-11-28 ENCOUNTER — Ambulatory Visit: Payer: Self-pay | Admitting: Family Medicine

## 2023-11-28 LAB — RENAL FUNCTION PANEL
Albumin: 4.1 g/dL (ref 3.8–4.8)
BUN/Creatinine Ratio: 15 (ref 10–24)
BUN: 15 mg/dL (ref 8–27)
CO2: 21 mmol/L (ref 20–29)
Calcium: 9.2 mg/dL (ref 8.6–10.2)
Chloride: 102 mmol/L (ref 96–106)
Creatinine, Ser: 1.02 mg/dL (ref 0.76–1.27)
Glucose: 217 mg/dL — ABNORMAL HIGH (ref 70–99)
Phosphorus: 3.6 mg/dL (ref 2.8–4.1)
Potassium: 3.4 mmol/L — ABNORMAL LOW (ref 3.5–5.2)
Sodium: 141 mmol/L (ref 134–144)
eGFR: 76 mL/min/1.73 (ref >59)

## 2023-11-28 LAB — HEMOGLOBIN A1C
Est. average glucose Bld gHb Est-mCnc: 151 mg/dL
Hgb A1c MFr Bld: 6.9 % — ABNORMAL HIGH (ref 4.8–5.6)

## 2024-01-03 DIAGNOSIS — H903 Sensorineural hearing loss, bilateral: Secondary | ICD-10-CM | POA: Diagnosis not present

## 2024-01-19 DIAGNOSIS — L821 Other seborrheic keratosis: Secondary | ICD-10-CM | POA: Diagnosis not present

## 2024-01-19 DIAGNOSIS — D2271 Melanocytic nevi of right lower limb, including hip: Secondary | ICD-10-CM | POA: Diagnosis not present

## 2024-01-19 DIAGNOSIS — D2272 Melanocytic nevi of left lower limb, including hip: Secondary | ICD-10-CM | POA: Diagnosis not present

## 2024-01-19 DIAGNOSIS — D2261 Melanocytic nevi of right upper limb, including shoulder: Secondary | ICD-10-CM | POA: Diagnosis not present

## 2024-01-19 DIAGNOSIS — D225 Melanocytic nevi of trunk: Secondary | ICD-10-CM | POA: Diagnosis not present

## 2024-01-19 DIAGNOSIS — D692 Other nonthrombocytopenic purpura: Secondary | ICD-10-CM | POA: Diagnosis not present

## 2024-01-19 DIAGNOSIS — D2262 Melanocytic nevi of left upper limb, including shoulder: Secondary | ICD-10-CM | POA: Diagnosis not present

## 2024-02-06 ENCOUNTER — Other Ambulatory Visit: Payer: Self-pay | Admitting: Family Medicine

## 2024-02-06 DIAGNOSIS — G4700 Insomnia, unspecified: Secondary | ICD-10-CM

## 2024-02-08 DIAGNOSIS — Z23 Encounter for immunization: Secondary | ICD-10-CM | POA: Diagnosis not present

## 2024-02-17 ENCOUNTER — Other Ambulatory Visit: Payer: Self-pay | Admitting: Family Medicine

## 2024-02-17 DIAGNOSIS — I10 Essential (primary) hypertension: Secondary | ICD-10-CM

## 2024-03-24 ENCOUNTER — Ambulatory Visit
Admission: RE | Admit: 2024-03-24 | Discharge: 2024-03-24 | Disposition: A | Discharge status: Home / Self Care | Payer: Self-pay | Source: Ambulatory Visit | Attending: Emergency Medicine | Admitting: Emergency Medicine

## 2024-03-24 VITALS — BP 136/75 | HR 94 | Temp 100.9°F | Resp 15 | Ht 73.0 in | Wt 213.8 lb

## 2024-03-24 DIAGNOSIS — J029 Acute pharyngitis, unspecified: Secondary | ICD-10-CM

## 2024-03-24 DIAGNOSIS — R0981 Nasal congestion: Secondary | ICD-10-CM | POA: Diagnosis not present

## 2024-03-24 DIAGNOSIS — J069 Acute upper respiratory infection, unspecified: Secondary | ICD-10-CM | POA: Diagnosis not present

## 2024-03-24 LAB — POC COVID19/FLU A&B COMBO
Covid Antigen, POC: NEGATIVE
Influenza A Antigen, POC: NEGATIVE
Influenza B Antigen, POC: NEGATIVE

## 2024-03-24 LAB — POCT RAPID STREP A (OFFICE): Rapid Strep A Screen: NEGATIVE

## 2024-03-24 MED ORDER — PROMETHAZINE-DM 6.25-15 MG/5ML PO SYRP: 5.0000 mL | ORAL_SOLUTION | Freq: Four times a day (QID) | ORAL | 0 refills | Status: DC | PRN

## 2024-03-24 MED ORDER — ACETAMINOPHEN 325 MG PO TABS
650.0000 mg | ORAL_TABLET | Freq: Once | ORAL | Status: AC
  Administered 2024-03-24: 650 mg via ORAL

## 2024-03-24 MED ORDER — BENZONATATE 100 MG PO CAPS: 200.0000 mg | ORAL_CAPSULE | Freq: Three times a day (TID) | ORAL | 0 refills | Status: DC

## 2024-03-24 MED ORDER — IPRATROPIUM BROMIDE 0.06 % NA SOLN: 2.0000 | Freq: Four times a day (QID) | NASAL | 12 refills | Status: DC

## 2024-03-24 NOTE — ED Provider Notes (Signed)
 [facility]    CSN: [id number] Arrival date & time: 03/24/24  1236      History   Chief Complaint Chief Complaint  Patient presents with   Sore Throat    Appointment   Cough    HPI Adrian Thompson is a 78 y.o. male.   HPI  78 year old male with past medical history significant for type 2 diabetes, diverticulosis, GERD, gout, hyperlipidemia, primary hypertension, IBS, chronic prostatitis, cochlear implant on the right and hard of hearing in the left wearing a hearing aid presents for evaluation of 2 days with respiratory symptoms that include runny nose, nasal congestion, muffled hearing in the left ear, sore throat, and a cough that is intermittently productive for yellow sputum.  He denies any fever, shortness breath, or wheezing.  Past Medical History:  Diagnosis Date   Cataract    Diabetes mellitus without complication (HCC)    GERD (gastroesophageal reflux disease)    Gout    History of chicken pox    History of measles    History of mumps    Hyperlipidemia    Hypertension     Patient Active Problem List   Diagnosis Date Noted   History of surgery 11/27/2023   History of tonsillectomy 11/27/2023   Insomnia 01/09/2023   Aortic atherosclerosis 01/12/2021   Chronic left shoulder pain 04/13/2020   Frequent PVCs 04/03/2018   Hypokalemia 07/18/2016   Cochlear implant in place 06/28/2016   GERD (gastroesophageal reflux disease) 02/26/2015   Hearing loss 02/26/2015   HLD (hyperlipidemia) 02/26/2015   Benign prostatic hyperplasia with urinary obstruction 09/04/2012   Chronic prostatitis 06/04/2012   Allergic rhinitis 02/08/2009   Diverticulosis of colon without hemorrhage 02/26/2008   Type 2 diabetes mellitus with hyperlipidemia (HCC) 02/04/2008   Gout 02/01/2007   Primary hypertension 02/01/2007   Irritable bowel syndrome 05/23/1998    Past Surgical History:  Procedure Laterality Date   COCHLEAR IMPLANT Right 06/28/2016   Helayne Prim, MD [facility]    COLONOSCOPY  2009   COLONOSCOPY WITH PROPOFOL  N/A 04/04/2018   Procedure: COLONOSCOPY WITH PROPOFOL ;  Surgeon: Dessa Reyes ORN, MD;  Location: [facility] ENDOSCOPY;  Service: Endoscopy;  Laterality: N/A;   EXTERNAL EAR SURGERY  2018   HIP ARTHROPLASTY Left 10/19/2023   [facility]   IR RADIOLOGIST EVAL & MGMT  11/11/2020   IR RADIOLOGIST EVAL & MGMT  12/30/2020   TONSILLECTOMY     UPPER GI ENDOSCOPY  2009   UPPER GI ENDOSCOPY  03/04/2011   [facility]; Excision of multiple gastric polyps       Home Medications    Prior to Admission medications   Medication Sig Start Date End Date Taking? Authorizing Provider  benzonatate (TESSALON) 100 MG capsule Take 2 capsules (200 mg total) by mouth every 8 (eight) hours. 03/24/24  Yes Bernardino Ditch, NP  ipratropium (ATROVENT) 0.06 % nasal spray Place 2 sprays into both nostrils 4 (four) times daily. 03/24/24  Yes Bernardino Ditch, NP  promethazine-dextromethorphan (PROMETHAZINE-DM) 6.25-15 MG/5ML syrup Take 5 mLs by mouth 4 (four) times daily as needed. 03/24/24  Yes Bernardino Ditch, NP  amitriptyline  (ELAVIL ) 25 MG tablet TAKE 1 TABLET AT BEDTIME AS NEEDED FOR SLEEP 02/06/24   Gasper Nancyann BRAVO, MD  amLODipine  (NORVASC ) 10 MG tablet Take 1 tablet (10 mg total) by mouth daily. 01/09/23   Gasper Nancyann BRAVO, MD  atenolol  (TENORMIN ) 50 MG tablet TAKE 1 TABLET EVERY DAY 02/18/24   Gasper Nancyann BRAVO, MD  finasteride  (PROSCAR ) 5 MG tablet  TAKE 1 TABLET EVERY DAY 08/14/23   Gasper Nancyann BRAVO, MD  glucosamine-chondroitin 500-400 MG tablet Take 1 tablet by mouth daily. 02/07/07   [provider]  glucose blood (ACCU-CHEK AVIVA PLUS) test strip Use as instructed 06/25/23   Gasper Nancyann BRAVO, MD  indomethacin  (INDOCIN ) 25 MG capsule Take 1 capsule (25 mg total) by mouth 3 (three) times daily with meals as needed. Up to 3 times a day 11/02/21   Gasper Nancyann BRAVO, MD  meloxicam  (MOBIC ) 15 MG tablet Take 1 tablet (15 mg total) by mouth daily as needed for pain. As needed 05/10/23   Gasper Nancyann BRAVO, MD  methocarbamol  (ROBAXIN ) 500 MG tablet Take 1 tablet (500 mg total) by mouth 2 (two) times daily. 09/07/22   Gasper Nancyann BRAVO, MD  methocarbamol  (ROBAXIN ) 500 MG tablet Take 500 mg by mouth 4 (four) times daily.    [provider]  Misc Natural Products (BLACK CHERRY CONCENTRATE PO) Take 1 tablet by mouth 2 (two) times daily.     [provider]  montelukast  (SINGULAIR ) 10 MG tablet Take 1 tablet (10 mg total) by mouth daily. 03/08/22   Gasper Nancyann BRAVO, MD  Multiple Vitamins-Minerals (MULTIVITAMIN ADULT PO) Take 1 tablet by mouth daily. 09/25/08   [provider]  omeprazole  (PRILOSEC) 20 MG capsule Take 1 capsule (20 mg total) by mouth daily. 06/19/23   Gasper Nancyann BRAVO, MD  potassium chloride  SA (KLOR-CON  M) 20 MEQ tablet Take 1 tablet (20 mEq total) by mouth daily. 06/22/23   Gasper Nancyann BRAVO, MD  pravastatin  (PRAVACHOL ) 20 MG tablet Take 1 tablet (20 mg total) by mouth daily. 06/19/23   Gasper Nancyann BRAVO, MD  tamsulosin  (FLOMAX ) 0.4 MG CAPS capsule Take 1 capsule (0.4 mg total) by mouth daily. 01/09/23   Gasper Nancyann BRAVO, MD  Triamcinolone  Acetonide (NASACORT AQ NA) Place into the nose daily.    [provider]  valsartan -hydrochlorothiazide  (DIOVAN -HCT) 320-25 MG tablet TAKE 1 TABLET EVERY DAY. NEW DOSE 09/11/23   Gasper Nancyann BRAVO, MD    Family History Family History  Problem Relation Age of Onset   Hypertension Mother    Hypertension Sister     Social History Social History   Tobacco Use   Smoking status: Never   Smokeless tobacco: Former    Types: Snuff, Chew  Vaping Use   Vaping status: Never Used  Substance Use Topics   Alcohol  use: No    Alcohol /week: 0.0 standard drinks of alcohol    Drug use: No     Allergies   Patient has no known allergies.   Review of Systems Review of Systems  Constitutional:  Negative for fever.  HENT:  Positive for congestion, hearing loss, rhinorrhea and sore throat. Negative for ear pain.    Respiratory:  Positive for cough. Negative for shortness of breath and wheezing.      Physical Exam Triage Vital Signs ED Triage Vitals  Encounter Vitals Group     BP      Girls Systolic BP Percentile      Girls Diastolic BP Percentile      Boys Systolic BP Percentile      Boys Diastolic BP Percentile      Pulse      Resp      Temp      Temp src      SpO2      Weight      Height      Head Circumference  Peak Flow      Pain Score      Pain Loc      Pain Education      Exclude from Growth Chart    No data found.  Updated Vital Signs BP 136/75 (BP Location: Left Arm)   Pulse 94   Temp (!) 100.9 F (38.3 C) (Oral)   Resp 15   Ht 6' 1 (1.854 m)   Wt 213 lb 13.5 oz (97 kg)   SpO2 99%   BMI 28.21 kg/m   Visual Acuity Right Eye Distance:   Left Eye Distance:   Bilateral Distance:    Right Eye Near:   Left Eye Near:    Bilateral Near:     Physical Exam Vitals and nursing note reviewed.  Constitutional:      Appearance: Normal appearance. He is not ill-appearing.  HENT:     Head: Normocephalic and atraumatic.     Right Ear: Tympanic membrane, ear canal and external ear normal. There is no impacted cerumen.     Left Ear: Ear canal and external ear normal. There is no impacted cerumen.     Ears:     Comments: Left TM is mildly erythematous and there is a small serous effusion present.  No retraction or bulging of the TM noted.  EAC is clear.    Nose: Congestion and rhinorrhea present.     Comments: Nasal mucosa is edematous and erythematous with clear discharge in both naris.    Mouth/Throat:     Mouth: Mucous membranes are moist.     Pharynx: Oropharynx is clear. Posterior oropharyngeal erythema present. No oropharyngeal exudate.     Comments: Mild erythema to the posterior pharynx with clear postnasal drip. Cardiovascular:     Rate and Rhythm: Normal rate and regular rhythm.     Pulses: Normal pulses.     Heart sounds: Normal heart sounds. No murmur  heard.    No friction rub. No gallop.  Pulmonary:     Effort: Pulmonary effort is normal.     Breath sounds: Normal breath sounds. No wheezing, rhonchi or rales.  Musculoskeletal:     Cervical back: Normal range of motion and neck supple. No tenderness.  Lymphadenopathy:     Cervical: No cervical adenopathy.  Skin:    General: Skin is warm and dry.     Capillary Refill: Capillary refill takes less than 2 seconds.     Findings: No rash.  Neurological:     General: No focal deficit present.     Mental Status: He is alert and oriented to person, place, and time.      UC Treatments / Results  Labs (all labs ordered are listed, but only abnormal results are displayed) Labs Reviewed  POC COVID19/FLU A&B COMBO  POCT RAPID STREP A (OFFICE)    EKG   Radiology No results found.  Procedures Procedures (including critical care time)  Medications Ordered in UC Medications  acetaminophen  (TYLENOL ) tablet 650 mg (650 mg Oral Given 03/24/24 1315)    Initial Impression / Assessment and Plan / UC Course  I have reviewed the triage vital signs and the nursing notes.  Pertinent labs & imaging results that were available during my care of the patient were reviewed by me and considered in my medical decision making (see chart for details).   Patient is a pleasant, nontoxic-appearing 78 year old male presenting for evaluation of 2 days with respiratory symptoms as outlined HPI above.  His wife is also being  seen for similar symptoms though hers been going on for the past week.  He denies any fever at home though he does have a fever here in clinic of 100.9.  His physical exam does reveal an erythematous left tympanic membrane with a mild serous effusion and inflammation of the nasal mucosa with clear nasal discharge as well as clear postnasal drip in the posterior oropharynx.  Lungs are clear to auscultation all fields.  Differential diagnose include COVID, influenza, strep pharyngitis, viral  respiratory illness.  I will order a point-of-care COVID and flu antigen test as well as a rapid strep.  Respiratory panel negative for COVID or influenza.  Rapid strep negative.  I will discharge patient home with a diagnosis of viral URI with a cough.  I will prescribe Atrovent nasal spray for the nasal congestion and Tessalon Perles and Promethazine DM cough syrup for cough and congestion.  He may use over-the-counter Tylenol  and or ibuprofen as needed for fever or pain.   Final Clinical Impressions(s) / UC Diagnoses   Final diagnoses:  Acute pharyngitis, unspecified etiology  Nasal congestion  Viral URI with cough     Discharge Instructions      Your testing today was negative for COVID, strep, or influenza.  I do believe you have a respiratory virus which is causing your symptoms.  Use over-the-counter Tylenol  and or ibuprofen according to the package instructions as needed for any fever or pain.Use the Atrovent nasal spray, 2 squirts in each nostril every 6 hours, as needed for runny nose and postnasal drip.  Use the Tessalon Perles every 8 hours during the day.  Take them with a small sip of water.  They may give you some numbness to the base of your tongue or a metallic taste in your mouth, this is normal.  Use the Promethazine DM cough syrup at bedtime for cough and congestion.  It will make you drowsy so do not take it during the day.  Return for reevaluation or see your primary care provider for any new or worsening symptoms.      ED Prescriptions     Medication Sig Dispense Auth. Provider   benzonatate (TESSALON) 100 MG capsule Take 2 capsules (200 mg total) by mouth every 8 (eight) hours. 21 capsule Bernardino Ditch, NP   ipratropium (ATROVENT) 0.06 % nasal spray Place 2 sprays into both nostrils 4 (four) times daily. 15 mL Bernardino Ditch, NP   promethazine-dextromethorphan (PROMETHAZINE-DM) 6.25-15 MG/5ML syrup Take 5 mLs by mouth 4 (four) times daily as needed. 118 mL  Bernardino Ditch, NP      PDMP not reviewed this encounter.   Bernardino Ditch, NP 03/24/24 [phone]

## 2024-03-24 NOTE — ED Triage Notes (Signed)
 Patient c/o sore throat that started on Friday.  Patient states he is also having cough and congestion.  Patient unsure of fevers.

## 2024-03-24 NOTE — Discharge Instructions (Addendum)
 Your testing today was negative for COVID, strep, or influenza.  I do believe you have a respiratory virus which is causing your symptoms.  Use over-the-counter Tylenol  and or ibuprofen according to the package instructions as needed for any fever or pain.Use the Atrovent nasal spray, 2 squirts in each nostril every 6 hours, as needed for runny nose and postnasal drip.  Use the Tessalon Perles every 8 hours during the day.  Take them with a small sip of water.  They may give you some numbness to the base of your tongue or a metallic taste in your mouth, this is normal.  Use the Promethazine DM cough syrup at bedtime for cough and congestion.  It will make you drowsy so do not take it during the day.  Return for reevaluation or see your primary care provider for any new or worsening symptoms.

## 2024-03-30 ENCOUNTER — Ambulatory Visit
Admission: RE | Admit: 2024-03-30 | Discharge: 2024-03-30 | Disposition: A | Discharge status: Home / Self Care | Attending: Emergency Medicine | Admitting: Emergency Medicine

## 2024-03-30 ENCOUNTER — Ambulatory Visit (INDEPENDENT_AMBULATORY_CARE_PROVIDER_SITE_OTHER)

## 2024-03-30 VITALS — BP 157/87 | HR 93 | Temp 100.0°F | Resp 16 | Ht 73.0 in | Wt 213.8 lb

## 2024-03-30 DIAGNOSIS — R051 Acute cough: Secondary | ICD-10-CM | POA: Diagnosis not present

## 2024-03-30 DIAGNOSIS — J069 Acute upper respiratory infection, unspecified: Secondary | ICD-10-CM | POA: Diagnosis not present

## 2024-03-30 DIAGNOSIS — R0602 Shortness of breath: Secondary | ICD-10-CM | POA: Diagnosis not present

## 2024-03-30 DIAGNOSIS — R059 Cough, unspecified: Secondary | ICD-10-CM | POA: Diagnosis not present

## 2024-03-30 MED ORDER — AEROCHAMBER MV MISC: 2 refills | Status: DC

## 2024-03-30 MED ORDER — PROMETHAZINE-DM 6.25-15 MG/5ML PO SYRP: 5.0000 mL | ORAL_SOLUTION | Freq: Four times a day (QID) | ORAL | 0 refills | Status: DC | PRN

## 2024-03-30 MED ORDER — IPRATROPIUM BROMIDE 0.06 % NA SOLN: 2.0000 | Freq: Four times a day (QID) | NASAL | 12 refills | Status: DC

## 2024-03-30 MED ORDER — AMOXICILLIN-POT CLAVULANATE 875-125 MG PO TABS: 1.0000 | ORAL_TABLET | Freq: Two times a day (BID) | ORAL | 0 refills | Status: AC

## 2024-03-30 MED ORDER — ALBUTEROL SULFATE HFA 108 (90 BASE) MCG/ACT IN AERS: 2.0000 | INHALATION_SPRAY | RESPIRATORY_TRACT | 0 refills | Status: DC | PRN

## 2024-03-30 MED ORDER — BENZONATATE 100 MG PO CAPS: 200.0000 mg | ORAL_CAPSULE | Freq: Three times a day (TID) | ORAL | 0 refills | Status: DC

## 2024-03-30 NOTE — ED Provider Notes (Signed)
 [facility]    CSN: [id number] Arrival date & time: 03/30/24  1313      History   Chief Complaint Chief Complaint  Patient presents with   Cough   Appointment    HPI Adrian Thompson is a 78 y.o. male.   HPI  78 year old male with past medical history significant for diabetes, GERD, gout, and chronic prostatitis presents for evaluation of ongoing respiratory symptoms that include runny nose, nasal congestion, sore throat, intermittently productive cough for yellow sputum, and shortness of breath.  He was evaluated this urgent care 6 days ago and had negative COVID, flu, and strep testing.  Past Medical History:  Diagnosis Date   Cataract    Diabetes mellitus without complication (HCC)    GERD (gastroesophageal reflux disease)    Gout    History of chicken pox    History of measles    History of mumps    Hyperlipidemia    Hypertension     Patient Active Problem List   Diagnosis Date Noted   History of surgery 11/27/2023   History of tonsillectomy 11/27/2023   Insomnia 01/09/2023   Aortic atherosclerosis 01/12/2021   Chronic left shoulder pain 04/13/2020   Frequent PVCs 04/03/2018   Hypokalemia 07/18/2016   Cochlear implant in place 06/28/2016   GERD (gastroesophageal reflux disease) 02/26/2015   Hearing loss 02/26/2015   HLD (hyperlipidemia) 02/26/2015   Benign prostatic hyperplasia with urinary obstruction 09/04/2012   Chronic prostatitis 06/04/2012   Allergic rhinitis 02/08/2009   Diverticulosis of colon without hemorrhage 02/26/2008   Type 2 diabetes mellitus with hyperlipidemia (HCC) 02/04/2008   Gout 02/01/2007   Primary hypertension 02/01/2007   Irritable bowel syndrome 05/23/1998    Past Surgical History:  Procedure Laterality Date   COCHLEAR IMPLANT Right 06/28/2016   Helayne Prim, MD [facility]   COLONOSCOPY  2009   COLONOSCOPY WITH PROPOFOL  N/A 04/04/2018   Procedure: COLONOSCOPY WITH PROPOFOL ;  Surgeon: Dessa Reyes ORN, MD;   Location: [facility] ENDOSCOPY;  Service: Endoscopy;  Laterality: N/A;   EXTERNAL EAR SURGERY  2018   HIP ARTHROPLASTY Left 10/19/2023   [facility]   IR RADIOLOGIST EVAL & MGMT  11/11/2020   IR RADIOLOGIST EVAL & MGMT  12/30/2020   TONSILLECTOMY     UPPER GI ENDOSCOPY  2009   UPPER GI ENDOSCOPY  03/04/2011   [facility]; Excision of multiple gastric polyps       Home Medications    Prior to Admission medications   Medication Sig Start Date End Date Taking? Authorizing Provider  albuterol (VENTOLIN HFA) 108 (90 Base) MCG/ACT inhaler Inhale 2 puffs into the lungs every 4 (four) hours as needed. 03/30/24  Yes Bernardino Ditch, NP  amoxicillin-clavulanate (AUGMENTIN) 875-125 MG tablet Take 1 tablet by mouth every 12 (twelve) hours for 7 days. 03/30/24 04/06/24 Yes Bernardino Ditch, NP  Spacer/Aero-Holding Chambers (AEROCHAMBER MV) inhaler Use as instructed 03/30/24  Yes Bernardino Ditch, NP  amitriptyline  (ELAVIL ) 25 MG tablet TAKE 1 TABLET AT BEDTIME AS NEEDED FOR SLEEP 02/06/24   Gasper Nancyann BRAVO, MD  amLODipine  (NORVASC ) 10 MG tablet Take 1 tablet (10 mg total) by mouth daily. 01/09/23   Gasper Nancyann BRAVO, MD  atenolol  (TENORMIN ) 50 MG tablet TAKE 1 TABLET EVERY DAY 02/18/24   Gasper Nancyann BRAVO, MD  benzonatate (TESSALON) 100 MG capsule Take 2 capsules (200 mg total) by mouth every 8 (eight) hours. 03/30/24   Bernardino Ditch, NP  finasteride  (PROSCAR ) 5 MG tablet TAKE 1 TABLET EVERY DAY 08/14/23  Gasper Nancyann BRAVO, MD  glucosamine-chondroitin 500-400 MG tablet Take 1 tablet by mouth daily. 02/07/07   [provider]  glucose blood (ACCU-CHEK AVIVA PLUS) test strip Use as instructed 06/25/23   Gasper Nancyann BRAVO, MD  indomethacin  (INDOCIN ) 25 MG capsule Take 1 capsule (25 mg total) by mouth 3 (three) times daily with meals as needed. Up to 3 times a day 11/02/21   Gasper Nancyann BRAVO, MD  ipratropium (ATROVENT) 0.06 % nasal spray Place 2 sprays into both nostrils 4 (four) times daily. 03/30/24   Bernardino Ditch, NP  meloxicam   (MOBIC ) 15 MG tablet Take 1 tablet (15 mg total) by mouth daily as needed for pain. As needed 05/10/23   Gasper Nancyann BRAVO, MD  methocarbamol  (ROBAXIN ) 500 MG tablet Take 1 tablet (500 mg total) by mouth 2 (two) times daily. 09/07/22   Gasper Nancyann BRAVO, MD  methocarbamol  (ROBAXIN ) 500 MG tablet Take 500 mg by mouth 4 (four) times daily.    [provider]  Misc Natural Products (BLACK CHERRY CONCENTRATE PO) Take 1 tablet by mouth 2 (two) times daily.     [provider]  montelukast  (SINGULAIR ) 10 MG tablet Take 1 tablet (10 mg total) by mouth daily. 03/08/22   Gasper Nancyann BRAVO, MD  Multiple Vitamins-Minerals (MULTIVITAMIN ADULT PO) Take 1 tablet by mouth daily. 09/25/08   [provider]  omeprazole  (PRILOSEC) 20 MG capsule Take 1 capsule (20 mg total) by mouth daily. 06/19/23   Gasper Nancyann BRAVO, MD  potassium chloride  SA (KLOR-CON  M) 20 MEQ tablet Take 1 tablet (20 mEq total) by mouth daily. 06/22/23   Gasper Nancyann BRAVO, MD  pravastatin  (PRAVACHOL ) 20 MG tablet Take 1 tablet (20 mg total) by mouth daily. 06/19/23   Gasper Nancyann BRAVO, MD  promethazine-dextromethorphan (PROMETHAZINE-DM) 6.25-15 MG/5ML syrup Take 5 mLs by mouth 4 (four) times daily as needed. 03/30/24   Bernardino Ditch, NP  tamsulosin  (FLOMAX ) 0.4 MG CAPS capsule Take 1 capsule (0.4 mg total) by mouth daily. 01/09/23   Gasper Nancyann BRAVO, MD  Triamcinolone  Acetonide (NASACORT AQ NA) Place into the nose daily.    [provider]  valsartan -hydrochlorothiazide  (DIOVAN -HCT) 320-25 MG tablet TAKE 1 TABLET EVERY DAY. NEW DOSE 09/11/23   Gasper Nancyann BRAVO, MD    Family History Family History  Problem Relation Age of Onset   Hypertension Mother    Hypertension Sister     Social History Social History   Tobacco Use   Smoking status: Never   Smokeless tobacco: Former    Types: Snuff, Chew  Vaping Use   Vaping status: Never Used  Substance Use Topics   Alcohol  use: No    Alcohol /week: 0.0 standard drinks of  alcohol    Drug use: No     Allergies   Patient has no known allergies.   Review of Systems Review of Systems  Constitutional:  Positive for fever.  HENT:  Positive for congestion, rhinorrhea and sore throat.   Respiratory:  Positive for cough, shortness of breath and wheezing.      Physical Exam Triage Vital Signs ED Triage Vitals  Encounter Vitals Group     BP      Girls Systolic BP Percentile      Girls Diastolic BP Percentile      Boys Systolic BP Percentile      Boys Diastolic BP Percentile      Pulse      Resp      Temp  Temp src      SpO2      Weight      Height      Head Circumference      Peak Flow      Pain Score      Pain Loc      Pain Education      Exclude from Growth Chart    No data found.  Updated Vital Signs BP (!) 157/87 (BP Location: Right Arm)   Pulse 93   Temp 100 F (37.8 C) (Oral)   Resp 16   Ht 6' 1 (1.854 m)   Wt 213 lb 13.5 oz (97 kg)   SpO2 95%   BMI 28.21 kg/m   Visual Acuity Right Eye Distance:   Left Eye Distance:   Bilateral Distance:    Right Eye Near:   Left Eye Near:    Bilateral Near:     Physical Exam Vitals and nursing note reviewed.  Constitutional:      Appearance: Normal appearance. He is not ill-appearing.  HENT:     Head: Normocephalic and atraumatic.  Cardiovascular:     Rate and Rhythm: Normal rate and regular rhythm.     Pulses: Normal pulses.     Heart sounds: Normal heart sounds. No murmur heard.    No friction rub. No gallop.  Pulmonary:     Effort: Pulmonary effort is normal.     Breath sounds: Normal breath sounds. No wheezing, rhonchi or rales.  Skin:    General: Skin is warm and dry.     Capillary Refill: Capillary refill takes less than 2 seconds.     Findings: No rash.  Neurological:     General: No focal deficit present.     Mental Status: He is alert and oriented to person, place, and time.      UC Treatments / Results  Labs (all labs ordered are listed, but only  abnormal results are displayed) Labs Reviewed - No data to display  EKG   Radiology DG Chest 2 View Result Date: 03/30/2024 EXAM: 2 VIEW(S) XRAY OF THE CHEST 03/30/2024 01:57:00 PM COMPARISON: 5 / 26 / 22. CLINICAL HISTORY: Cough x 1 week with shortness of breath FINDINGS: LUNGS AND PLEURA: Low normal lung volumes. No focal pulmonary opacity. No pulmonary edema. No pleural effusion. No pneumothorax. HEART AND MEDIASTINUM: No acute abnormality of the cardiac and mediastinal silhouettes. BONES AND SOFT TISSUES: Thoracic spine degeneration. No acute osseous abnormality. IMPRESSION: 1. No acute cardiopulmonary process. Electronically signed by: Norman Gatlin MD 03/30/2024 02:30 PM EST RP Workstation: [id number]    Procedures Procedures (including critical care time)  Medications Ordered in UC Medications - No data to display  Initial Impression / Assessment and Plan / UC Course  I have reviewed the triage vital signs and the nursing notes.  Pertinent labs & imaging results that were available during my care of the patient were reviewed by me and considered in my medical decision making (see chart for details).   Patient is a nontoxic-appearing 78 year old male presenting for evaluation of ongoing respiratory symptoms as outlined in HPI above.  He reports that he feels short of breath but he is able to speak in full sentences without dyspnea or tachypnea.  Respiratory rate at triage was 16 with a 95% room air oxygen saturation.  He is febrile with an oral temp of 100.  His lungs are clear to auscultation all fields.  He was seen 6 days ago and had negative  COVID, influenza, and strep testing.  A chest x-ray was not obtained at his last visit so I will obtain a chest x-ray to evaluate for any acute cardiopulmonary pathology.  Chest x-ray independently reviewed and evaluated by me.  Impression: Blunting of bilateral costophrenic angles.  Pulmonary vasculature is prominent.  Cardiomediastinal  silhouette appears normal.  Radiology read is pending. Radiology impression states no acute cardiopulmonary process.  Given the patient has continued respiratory symptoms and is now running a fever I will treat him for presumptive bacterial source for treatment of his URI.  I have also refilled Atrovent nasal spray, Tessalon Perles, and Promethazine DM cough syrup.   Final Clinical Impressions(s) / UC Diagnoses   Final diagnoses:  Acute cough  URI with cough and congestion     Discharge Instructions      Your chest x-ray did not show any evidence of pneumonia.  However, given that your symptoms do not appear to be improving and you are developing shortness of breath and we discharged him on Augmentin to cover for potential bacterial sources of your upper respiratory tract infection.  Take the Augmentin twice daily with food for 7 days for treatment of your URI.  Use the albuterol inhaler, with the spacer, and take 1 to 2 puffs every 4-6 hours as needed for any shortness of breath or wheezing.Use the Atrovent nasal spray, 2 squirts in each nostril every 6 hours, as needed for runny nose and postnasal drip.  Use the Tessalon Perles every 8 hours during the day.  Take them with a small sip of water.  They may give you some numbness to the base of your tongue or a metallic taste in your mouth, this is normal.  Use the Promethazine DM cough syrup at bedtime for cough and congestion.  It will make you drowsy so do not take it during the day.  Return for reevaluation or see your primary care provider for any new or worsening symptoms.      ED Prescriptions     Medication Sig Dispense Auth. Provider   amoxicillin-clavulanate (AUGMENTIN) 875-125 MG tablet Take 1 tablet by mouth every 12 (twelve) hours for 7 days. 14 tablet Bernardino Ditch, NP   benzonatate (TESSALON) 100 MG capsule Take 2 capsules (200 mg total) by mouth every 8 (eight) hours. 21 capsule Bernardino Ditch, NP   ipratropium  (ATROVENT) 0.06 % nasal spray Place 2 sprays into both nostrils 4 (four) times daily. 15 mL Bernardino Ditch, NP   promethazine-dextromethorphan (PROMETHAZINE-DM) 6.25-15 MG/5ML syrup Take 5 mLs by mouth 4 (four) times daily as needed. 118 mL Bernardino Ditch, NP   Spacer/Aero-Holding Chambers (AEROCHAMBER MV) inhaler Use as instructed 1 each Bernardino Ditch, NP   albuterol (VENTOLIN HFA) 108 (90 Base) MCG/ACT inhaler Inhale 2 puffs into the lungs every 4 (four) hours as needed. 18 g Bernardino Ditch, NP      PDMP not reviewed this encounter.   Bernardino Ditch, NP 03/30/24 1441

## 2024-03-30 NOTE — Discharge Instructions (Addendum)
 Your chest x-ray did not show any evidence of pneumonia.  However, given that your symptoms do not appear to be improving and you are developing shortness of breath and we discharged him on Augmentin to cover for potential bacterial sources of your upper respiratory tract infection.  Take the Augmentin twice daily with food for 7 days for treatment of your URI.  Use the albuterol inhaler, with the spacer, and take 1 to 2 puffs every 4-6 hours as needed for any shortness of breath or wheezing.Use the Atrovent nasal spray, 2 squirts in each nostril every 6 hours, as needed for runny nose and postnasal drip.  Use the Tessalon Perles every 8 hours during the day.  Take them with a small sip of water.  They may give you some numbness to the base of your tongue or a metallic taste in your mouth, this is normal.  Use the Promethazine DM cough syrup at bedtime for cough and congestion.  It will make you drowsy so do not take it during the day.  Return for reevaluation or see your primary care provider for any new or worsening symptoms.

## 2024-03-30 NOTE — ED Triage Notes (Addendum)
 Pt c/o cough, nasal congestion sore throat, shortness of breath. Started over a week ago. Pt was seen last Sunday for this and symptoms were treated. Covid/flu negative, strep negative. Reports subjective fever.

## 2024-04-03 ENCOUNTER — Other Ambulatory Visit: Payer: Self-pay | Admitting: Family Medicine

## 2024-04-03 DIAGNOSIS — N411 Chronic prostatitis: Secondary | ICD-10-CM

## 2024-04-27 ENCOUNTER — Other Ambulatory Visit: Payer: Self-pay | Admitting: Family Medicine

## 2024-04-27 DIAGNOSIS — I10 Essential (primary) hypertension: Secondary | ICD-10-CM

## 2024-05-13 ENCOUNTER — Ambulatory Visit: Admitting: Family Medicine

## 2024-05-13 ENCOUNTER — Encounter: Payer: Self-pay | Admitting: Family Medicine

## 2024-05-13 VITALS — BP 136/78 | HR 64 | Ht 73.0 in | Wt 219.8 lb

## 2024-05-13 DIAGNOSIS — E1169 Type 2 diabetes mellitus with other specified complication: Secondary | ICD-10-CM | POA: Diagnosis not present

## 2024-05-13 DIAGNOSIS — Z0001 Encounter for general adult medical examination with abnormal findings: Secondary | ICD-10-CM | POA: Diagnosis not present

## 2024-05-13 DIAGNOSIS — E876 Hypokalemia: Secondary | ICD-10-CM | POA: Diagnosis not present

## 2024-05-13 DIAGNOSIS — I493 Ventricular premature depolarization: Secondary | ICD-10-CM

## 2024-05-13 DIAGNOSIS — E785 Hyperlipidemia, unspecified: Secondary | ICD-10-CM

## 2024-05-13 DIAGNOSIS — N401 Enlarged prostate with lower urinary tract symptoms: Secondary | ICD-10-CM | POA: Diagnosis not present

## 2024-05-13 DIAGNOSIS — I10 Essential (primary) hypertension: Secondary | ICD-10-CM

## 2024-05-13 DIAGNOSIS — Z125 Encounter for screening for malignant neoplasm of prostate: Secondary | ICD-10-CM | POA: Diagnosis not present

## 2024-05-13 DIAGNOSIS — Z0000 Encounter for general adult medical examination without abnormal findings: Secondary | ICD-10-CM

## 2024-05-13 DIAGNOSIS — N138 Other obstructive and reflux uropathy: Secondary | ICD-10-CM

## 2024-05-13 DIAGNOSIS — G4700 Insomnia, unspecified: Secondary | ICD-10-CM

## 2024-05-13 MED ORDER — AMITRIPTYLINE HCL 25 MG PO TABS: ORAL_TABLET | ORAL | Status: AC

## 2024-05-13 NOTE — Progress Notes (Signed)
 "     Established patient visit   Patient: Adrian Thompson   DOB: August 06, 1945   78 y.o. Male  MRN: [id number] Visit Date: 05/13/2024  Today's healthcare provider: Nancyann Perry, MD   Chief Complaint  Patient presents with   Follow-up    Last completed 05/04/22 Diet - cutting back on sweets   Exercise - working on farm Feeling - well Sleeping - fairly well Concerns - drainage in the back of his throat and feels like its something there that he can not cough up or swallow. Symptoms started early November. Has been seen at walk-in clinic twice and completed courses of treatment and would like to see about coming off his amitriptyline   -Diabteic Eye Exam requested from [facility]    Subjective    Discussed the use of AI scribe software for clinical note transcription with the patient, who gave verbal consent to proceed.  History of Present Illness   Adrian Thompson is a 78 year old male who presents for a routine follow-up.  He has had a sore throat since November, which has not completely resolved despite visiting a walk-in clinic twice. He was given medication and an inhaler, which he is no longer using. He also received a chest X-ray. He experiences difficulty coughing up phlegm and notes epistaxis. He reports that his symptoms sometimes worsen after eating, but he denies experiencing heartburn.  His blood sugar levels were elevated during his illness and after a previous hip operation. He monitors his blood pressure at home, which he states usually runs well. No chest pain, heart flutters, or shortness of breath, except when he had the sore throat.  He is currently taking finasteride  and tamsulosin  for BPH, which he reports are effective with no issues emptying his bladder. He also takes amitriptyline  at night for sleep, initially prescribed for a 'nervous stomach'. He wants to discontinue this medication.  He has a history of cataracts, which were noted to be growing  slightly at his last eye doctor visit one to two months ago. He also has a cochlear implant, which initially worked well but now causes difficulty understanding speech in noisy environments.  He reports frequent epistaxis and is taking sinus medication. No stomach pain, cramping, or changes in bowel habits.     Lab Results  Component Value Date   HGBA1C 6.9 (H) 11/27/2023   HGBA1C 6.6 (A) 05/10/2023   HGBA1C 6.6 (A) 01/09/2023   Lab Results  Component Value Date   NA 141 11/27/2023   K 3.4 (L) 11/27/2023   CREATININE 1.02 11/27/2023   EGFR 76 11/27/2023   GLUCOSE 217 (H) 11/27/2023   Lab Results  Component Value Date   CHOL 152 05/10/2023   HDL 65 05/10/2023   LDLCALC 67 05/10/2023   TRIG 110 05/10/2023   CHOLHDL 2.3 05/10/2023     Medications: Show/hide medication list[1] Review of Systems  Constitutional:  Negative for appetite change, chills and fever.  Respiratory:  Negative for chest tightness, shortness of breath and wheezing.   Cardiovascular:  Negative for chest pain and palpitations.  Gastrointestinal:  Negative for abdominal pain, nausea and vomiting.       Objective    BP 136/78 (BP Location: Left Arm, Patient Position: Sitting, Cuff Size: Large)   Pulse 64   Ht 6' 1 (1.854 m)   Wt 219 lb 12.8 oz (99.7 kg)   SpO2 99%   BMI 29.00 kg/m   Physical Exam   General: Appearance:  Well developed, well nourished male in no acute distress  Eyes:    PERRL, conjunctiva/corneas clear, EOM's intact       Lungs:     Clear to auscultation bilaterally, respirations unlabored  Heart:    Normal heart rate. Normal rhythm. No murmurs, rubs, or gallops.    MS:   All extremities are intact.    Neurologic:   Awake, alert, oriented x 3. No apparent focal neurological defect.        Assessment & Plan    1. Type 2 diabetes mellitus with hyperlipidemia (HCC) (Primary)  - Urine microalbumin-creatinine with uACR - Comprehensive metabolic panel with GFR - Hemoglobin  A1c - EKG 12-Lead  2. Hyperlipidemia, unspecified hyperlipidemia type He is tolerating pravastatin  well with no adverse effects.   - Lipid panel - CBC  3. Benign prostatic hyperplasia with urinary obstruction Doing well on tamsulosin  and finasteride .   4. Hypokalemia Check labs today.   5. Insomnia, unspecified type Was prescribed amitriptyline  many years but would like to see if he can get by without it due to increased risk of adverse effects with aging.  - amitriptyline  (ELAVIL ) 25 MG tablet; Take 1/2 tablet at bedtime for a month, then stop if doing well  6. Primary hypertension Well controlled.  Continue current medications.   - EKG 12-Lead  7. Frequent PVCs Well controlled on atenolol .   8. Prostate cancer screening  - PSA Total (Reflex To Free)   Return in about 6 months (around 11/11/2024) for Diabetes.      Nancyann Perry, MD  [facility] [phone] (phone) [phone] (fax)  [facility]    [1]  Outpatient Medications Prior to Visit  Medication Sig   amLODipine  (NORVASC ) 10 MG tablet TAKE 1 TABLET EVERY DAY   atenolol  (TENORMIN ) 50 MG tablet TAKE 1 TABLET EVERY DAY   finasteride  (PROSCAR ) 5 MG tablet TAKE 1 TABLET EVERY DAY   glucosamine-chondroitin 500-400 MG tablet Take 1 tablet by mouth daily.   glucose blood (ACCU-CHEK AVIVA PLUS) test strip Use as instructed   indomethacin  (INDOCIN ) 25 MG capsule Take 1 capsule (25 mg total) by mouth 3 (three) times daily with meals as needed. Up to 3 times a day   meloxicam  (MOBIC ) 15 MG tablet Take 1 tablet (15 mg total) by mouth daily as needed for pain. As needed   methocarbamol  (ROBAXIN ) 500 MG tablet Take 1 tablet (500 mg total) by mouth 2 (two) times daily.   methocarbamol  (ROBAXIN ) 500 MG tablet Take 500 mg by mouth 4 (four) times daily.   Misc Natural Products (BLACK CHERRY CONCENTRATE PO) Take 1 tablet by mouth 2 (two) times daily.    montelukast  (SINGULAIR ) 10 MG  tablet Take 1 tablet (10 mg total) by mouth daily.   Multiple Vitamins-Minerals (MULTIVITAMIN ADULT PO) Take 1 tablet by mouth daily.   omeprazole  (PRILOSEC) 20 MG capsule Take 1 capsule (20 mg total) by mouth daily.   potassium chloride  SA (KLOR-CON  M) 20 MEQ tablet Take 1 tablet (20 mEq total) by mouth daily.   pravastatin  (PRAVACHOL ) 20 MG tablet Take 1 tablet (20 mg total) by mouth daily.   tamsulosin  (FLOMAX ) 0.4 MG CAPS capsule TAKE 1 CAPSULE EVERY DAY   Triamcinolone  Acetonide (NASACORT AQ NA) Place into the nose daily.   valsartan -hydrochlorothiazide  (DIOVAN -HCT) 320-25 MG tablet TAKE 1 TABLET EVERY DAY. NEW DOSE   [DISCONTINUED] amitriptyline  (ELAVIL ) 25 MG tablet TAKE 1 TABLET AT BEDTIME AS NEEDED FOR SLEEP   [  DISCONTINUED] albuterol  (VENTOLIN  HFA) 108 (90 Base) MCG/ACT inhaler Inhale 2 puffs into the lungs every 4 (four) hours as needed. (Patient not taking: Reported on 05/13/2024)   [DISCONTINUED] benzonatate  (TESSALON ) 100 MG capsule Take 2 capsules (200 mg total) by mouth every 8 (eight) hours. (Patient not taking: Reported on 05/13/2024)   [DISCONTINUED] ipratropium (ATROVENT ) 0.06 % nasal spray Place 2 sprays into both nostrils 4 (four) times daily. (Patient not taking: Reported on 05/13/2024)   [DISCONTINUED] promethazine -dextromethorphan (PROMETHAZINE -DM) 6.25-15 MG/5ML syrup Take 5 mLs by mouth 4 (four) times daily as needed. (Patient not taking: Reported on 05/13/2024)   [DISCONTINUED] Spacer/Aero-Holding Chambers (AEROCHAMBER MV) inhaler Use as instructed (Patient not taking: Reported on 05/13/2024)   No facility-administered medications prior to visit.   "

## 2024-05-13 NOTE — Patient Instructions (Signed)
 SABRA

## 2024-05-13 NOTE — Progress Notes (Signed)
 "  Chief Complaint  Patient presents with   Follow-up    Last completed 05/04/22 Diet - cutting back on sweets   Exercise - working on farm Feeling - well Sleeping - fairly well Concerns - drainage in the back of his throat and feels like its something there that he can not cough up or swallow. Symptoms started early November. Has been seen at walk-in clinic twice and completed courses of treatment and would like to see about coming off his amitriptyline   -Diabteic Eye Exam requested from [facility]      Subjective:   Adrian Thompson is a 78 y.o. male who presents for a Medicare Annual Wellness Visit.  Visit info / Clinical Intake: Medicare Wellness Visit Type:: Subsequent Annual Wellness Visit Persons participating in visit and providing information:: patient Medicare Wellness Visit Mode:: In-person (required for WTM) Interpreter Needed?: No Pre-visit prep was completed: no AWV questionnaire completed by patient prior to visit?: no Living arrangements:: lives with spouse/significant other Patient's Overall Health Status Rating: (!) fair Typical amount of pain: some Does pain affect daily life?: no Are you currently prescribed opioids?: no  Dietary Habits and Nutritional Risks How many meals a day?: 3 Eats fruit and vegetables daily?: (!) no Most meals are obtained by: having others provide food In the last 2 weeks, have you had any of the following?: none Diabetic:: (!) yes Any non-healing wounds?: no How often do you check your BS?: 1 Would you like to be referred to a Nutritionist or for Diabetic Management? : no  Functional Status Activities of Daily Living (to include ambulation/medication): Independent Ambulation: Independent Medication Administration: Independent Home Management (perform basic housework or laundry): Independent Manage your own finances?: (!) no Primary transportation is: driving Concerns about vision?: no *vision screening is required  for WTM* Concerns about hearing?: (!) yes Uses hearing aids?: (!) yes Hear whispered voice?: yes  Fall Screening Falls in the past year?: 0 Number of falls in past year: 0 Was there an injury with Fall?: 0 Fall Risk Category Calculator: 0 Patient Fall Risk Level: Low Fall Risk  Fall Risk Patient at Risk for Falls Due to: No Fall Risks Fall risk Follow up: Falls evaluation completed  Home and Transportation Safety: All rugs have non-skid backing?: yes All stairs or steps have railings?: yes Grab bars in the bathtub or shower?: yes Have non-skid surface in bathtub or shower?: (!) no Good home lighting?: yes Regular seat belt use?: yes Hospital stays in the last year:: (!) no; yes How many hospital stays:: 1 Reason: hip replacement  Cognitive Assessment Difficulty concentrating, remembering, or making decisions? : yes Will 6CIT or Mini Cog be Completed: no 6CIT or Mini Cog Declined: patient alert, oriented, able to answer questions appropriately and recall recent events  Advance Directives (For Healthcare) Does Patient Have a Medical Advance Directive?: Yes Type of Advance Directive: Healthcare Power of [place]; Living will Copy of Healthcare Power of Attorney in Chart?: No - copy requested Copy of Living Will in Chart?: No - copy requested  Reviewed/Updated  Reviewed/Updated: Reviewed All (Medical, Surgical, Family, Medications, Allergies, Care Teams, Patient Goals)    Allergies (verified) Patient has no known allergies.   Current Medications (verified) Outpatient Encounter Medications as of 05/13/2024  Medication Sig   amLODipine  (NORVASC ) 10 MG tablet TAKE 1 TABLET EVERY DAY   atenolol  (TENORMIN ) 50 MG tablet TAKE 1 TABLET EVERY DAY   finasteride  (PROSCAR ) 5 MG tablet TAKE 1 TABLET EVERY DAY  glucosamine-chondroitin 500-400 MG tablet Take 1 tablet by mouth daily.   glucose blood (ACCU-CHEK AVIVA PLUS) test strip Use as instructed   indomethacin  (INDOCIN ) 25 MG  capsule Take 1 capsule (25 mg total) by mouth 3 (three) times daily with meals as needed. Up to 3 times a day   meloxicam  (MOBIC ) 15 MG tablet Take 1 tablet (15 mg total) by mouth daily as needed for pain. As needed   methocarbamol  (ROBAXIN ) 500 MG tablet Take 1 tablet (500 mg total) by mouth 2 (two) times daily.   methocarbamol  (ROBAXIN ) 500 MG tablet Take 500 mg by mouth 4 (four) times daily.   Misc Natural Products (BLACK CHERRY CONCENTRATE PO) Take 1 tablet by mouth 2 (two) times daily.    montelukast  (SINGULAIR ) 10 MG tablet Take 1 tablet (10 mg total) by mouth daily.   Multiple Vitamins-Minerals (MULTIVITAMIN ADULT PO) Take 1 tablet by mouth daily.   omeprazole  (PRILOSEC) 20 MG capsule Take 1 capsule (20 mg total) by mouth daily.   potassium chloride  SA (KLOR-CON  M) 20 MEQ tablet Take 1 tablet (20 mEq total) by mouth daily.   pravastatin  (PRAVACHOL ) 20 MG tablet Take 1 tablet (20 mg total) by mouth daily.   tamsulosin  (FLOMAX ) 0.4 MG CAPS capsule TAKE 1 CAPSULE EVERY DAY   Triamcinolone  Acetonide (NASACORT AQ NA) Place into the nose daily.   valsartan -hydrochlorothiazide  (DIOVAN -HCT) 320-25 MG tablet TAKE 1 TABLET EVERY DAY. NEW DOSE   [DISCONTINUED] amitriptyline  (ELAVIL ) 25 MG tablet TAKE 1 TABLET AT BEDTIME AS NEEDED FOR SLEEP   amitriptyline  (ELAVIL ) 25 MG tablet Take 1/2 tablet at bedtime for a month, then stop if doing well   [DISCONTINUED] albuterol  (VENTOLIN  HFA) 108 (90 Base) MCG/ACT inhaler Inhale 2 puffs into the lungs every 4 (four) hours as needed. (Patient not taking: Reported on 05/13/2024)   [DISCONTINUED] benzonatate  (TESSALON ) 100 MG capsule Take 2 capsules (200 mg total) by mouth every 8 (eight) hours. (Patient not taking: Reported on 05/13/2024)   [DISCONTINUED] ipratropium (ATROVENT ) 0.06 % nasal spray Place 2 sprays into both nostrils 4 (four) times daily. (Patient not taking: Reported on 05/13/2024)   [DISCONTINUED] promethazine -dextromethorphan (PROMETHAZINE -DM)  6.25-15 MG/5ML syrup Take 5 mLs by mouth 4 (four) times daily as needed. (Patient not taking: Reported on 05/13/2024)   [DISCONTINUED] Spacer/Aero-Holding Chambers (AEROCHAMBER MV) inhaler Use as instructed (Patient not taking: Reported on 05/13/2024)   No facility-administered encounter medications on file as of 05/13/2024.    History: Past Medical History:  Diagnosis Date   Cataract    Diabetes mellitus without complication (HCC)    GERD (gastroesophageal reflux disease)    Gout    History of chicken pox    History of measles    History of mumps    Hyperlipidemia    Hypertension    Past Surgical History:  Procedure Laterality Date   COCHLEAR IMPLANT Right 06/28/2016   Helayne Prim, MD [facility]   COLONOSCOPY  2009   COLONOSCOPY WITH PROPOFOL  N/A 04/04/2018   Procedure: COLONOSCOPY WITH PROPOFOL ;  Surgeon: Dessa Reyes ORN, MD;  Location: [facility] ENDOSCOPY;  Service: Endoscopy;  Laterality: N/A;   EXTERNAL EAR SURGERY  2018   HIP ARTHROPLASTY Left 10/19/2023   [facility]   IR RADIOLOGIST EVAL & MGMT  11/11/2020   IR RADIOLOGIST EVAL & MGMT  12/30/2020   TONSILLECTOMY     UPPER GI ENDOSCOPY  2009   UPPER GI ENDOSCOPY  03/04/2011   [facility]; Excision of multiple gastric polyps   Family History  Problem  Relation Age of Onset   Hypertension Mother    Hypertension Sister    Social History   Occupational History   Occupation: Retired  Tobacco Use   Smoking status: Never   Smokeless tobacco: Former    Types: Snuff, Designer, Multimedia Use   Vaping status: Never Used  Substance and Sexual Activity   Alcohol  use: No    Alcohol /week: 0.0 standard drinks of alcohol    Drug use: No   Sexual activity: Not on file   Tobacco Counseling Counseling given: Not Answered  SDOH Screenings   Food Insecurity: No Food Insecurity (05/10/2024)  Housing: Low Risk (05/10/2024)  Transportation Needs: No Transportation Needs (05/10/2024)  Utilities: Not At Risk (05/13/2024)  Alcohol  Screen: Low  Risk (09/07/2022)  Depression (PHQ2-9): Low Risk (05/13/2024)  Financial Resource Strain: Low Risk (05/10/2024)  Physical Activity: Inactive (05/10/2024)  Social Connections: Moderately Isolated (05/10/2024)  Stress: No Stress Concern Present (05/10/2024)  Tobacco Use: Medium Risk (05/13/2024)  Health Literacy: Adequate Health Literacy (05/13/2024)   See flowsheets for full screening details  Depression Screen PHQ 2 & 9 Depression Scale- Over the past 2 weeks, how often have you been bothered by any of the following problems? Little interest or pleasure in doing things: 0 Feeling down, depressed, or hopeless (PHQ Adolescent also includes...irritable): 0 PHQ-2 Total Score: 0 Trouble falling or staying asleep, or sleeping too much: 0 Feeling tired or having little energy: 0 Poor appetite or overeating (PHQ Adolescent also includes...weight loss): 0 Feeling bad about yourself - or that you are a failure or have let yourself or your family down: 0 Trouble concentrating on things, such as reading the newspaper or watching television (PHQ Adolescent also includes...like school work): 0 Moving or speaking so slowly that other people could have noticed. Or the opposite - being so fidgety or restless that you have been moving around a lot more than usual: 0 Thoughts that you would be better off dead, or of hurting yourself in some way: 0 PHQ-9 Total Score: 0 If you checked off any problems, how difficult have these problems made it for you to do your work, take care of things at home, or get along with other people?: Not difficult at all     Goals Addressed   None          Objective:    Today's Vitals   05/13/24 0845  BP: 136/78  Pulse: 64  SpO2: 99%  Weight: 219 lb 12.8 oz (99.7 kg)  Height: 6' 1 (1.854 m)   Body mass index is 29 kg/m.  Hearing/Vision screen No results found. Immunizations and Health Maintenance Health Maintenance  Topic Date Due   OPHTHALMOLOGY EXAM   01/04/2024   COVID-19 Vaccine (10 - 2025-26 season) 01/22/2024   Diabetic kidney evaluation - Urine ACR  05/09/2024   HEMOGLOBIN A1C  05/29/2024   Diabetic kidney evaluation - eGFR measurement  11/26/2024   Colonoscopy  04/04/2025   Medicare Annual Wellness (AWV)  05/13/2025   DTaP/Tdap/Td (4 - Td or Tdap) 05/13/2031   Pneumococcal Vaccine: 50+ Years  Completed   Influenza Vaccine  Completed   Zoster Vaccines- Shingrix  Completed   Hepatitis C Screening  Addressed   Meningococcal B Vaccine  Aged Out        Assessment/Plan:  This is a routine wellness examination for Shaughn.  Patient Care Team: Gasper Nancyann BRAVO, MD as PCP - General (Family Medicine) Dessa, Reyes ORN, MD (General Surgery) Isenstein, Arin L, MD (Dermatology) Throat,  [facility])  I have personally reviewed and noted the following in the patients chart:   Medical and social history Use of alcohol , tobacco or illicit drugs  Current medications and supplements including opioid prescriptions. Functional ability and status Nutritional status Physical activity Advanced directives List of other physicians Hospitalizations, surgeries, and ER visits in previous 12 months Vitals Screenings to include cognitive, depression, and falls Referrals and appointments  Orders Placed This Encounter  Procedures   Urine microalbumin-creatinine with uACR   Comprehensive metabolic panel with GFR    Has the patient fasted?:   Yes   Lipid panel    Has the patient fasted?:   Yes   Hemoglobin A1c   PSA Total (Reflex To Free)   CBC   EKG 12-Lead   In addition, I have reviewed and discussed with patient certain preventive protocols, quality metrics, and best practice recommendations. A written personalized care plan for preventive services as well as general preventive health recommendations were provided to patient.   Nancyann Perry, MD   05/13/2024   Return in about 6 months  (around 11/11/2024) for Diabetes.  After Visit Summary: (In Person-Printed) AVS printed and given to the patient   "

## 2024-05-14 ENCOUNTER — Ambulatory Visit: Payer: Self-pay | Admitting: Family Medicine

## 2024-05-14 LAB — COMPREHENSIVE METABOLIC PANEL WITH GFR
ALT: 14 IU/L (ref 0–44)
AST: 18 IU/L (ref 0–40)
Albumin: 4.2 g/dL (ref 3.8–4.8)
Alkaline Phosphatase: 69 IU/L (ref 47–123)
BUN/Creatinine Ratio: 13 (ref 10–24)
BUN: 12 mg/dL (ref 8–27)
Bilirubin Total: 0.4 mg/dL (ref 0.0–1.2)
CO2: 25 mmol/L (ref 20–29)
Calcium: 9.7 mg/dL (ref 8.6–10.2)
Chloride: 101 mmol/L (ref 96–106)
Creatinine, Ser: 0.9 mg/dL (ref 0.76–1.27)
Globulin, Total: 2.2 g/dL (ref 1.5–4.5)
Glucose: 150 mg/dL — ABNORMAL HIGH (ref 70–99)
Potassium: 3.6 mmol/L (ref 3.5–5.2)
Sodium: 141 mmol/L (ref 134–144)
Total Protein: 6.4 g/dL (ref 6.0–8.5)
eGFR: 88 mL/min/1.73

## 2024-05-14 LAB — CBC
Hematocrit: 46 % (ref 37.5–51.0)
Hemoglobin: 15.3 g/dL (ref 13.0–17.7)
MCH: 29.8 pg (ref 26.6–33.0)
MCHC: 33.3 g/dL (ref 31.5–35.7)
MCV: 90 fL (ref 79–97)
Platelets: 278 x10E3/uL (ref 150–450)
RBC: 5.13 x10E6/uL (ref 4.14–5.80)
RDW: 13.1 % (ref 11.6–15.4)
WBC: 7.4 x10E3/uL (ref 3.4–10.8)

## 2024-05-14 LAB — LIPID PANEL
Chol/HDL Ratio: 2.6 ratio (ref 0.0–5.0)
Cholesterol, Total: 154 mg/dL (ref 100–199)
HDL: 60 mg/dL
LDL Chol Calc (NIH): 78 mg/dL (ref 0–99)
Triglycerides: 88 mg/dL (ref 0–149)
VLDL Cholesterol Cal: 16 mg/dL (ref 5–40)

## 2024-05-14 LAB — PSA TOTAL (REFLEX TO FREE): Prostate Specific Ag, Serum: 1.6 ng/mL (ref 0.0–4.0)

## 2024-05-14 LAB — HEMOGLOBIN A1C
Est. average glucose Bld gHb Est-mCnc: 163 mg/dL
Hgb A1c MFr Bld: 7.3 % — ABNORMAL HIGH (ref 4.8–5.6)

## 2024-05-14 LAB — MICROALBUMIN / CREATININE URINE RATIO
Creatinine, Urine: 120.3 mg/dL
Microalb/Creat Ratio: 28 mg/g{creat} (ref 0–29)
Microalbumin, Urine: 34 ug/mL

## 2024-06-01 ENCOUNTER — Other Ambulatory Visit: Payer: Self-pay | Admitting: Family Medicine

## 2024-06-01 DIAGNOSIS — E1169 Type 2 diabetes mellitus with other specified complication: Secondary | ICD-10-CM

## 2024-11-11 ENCOUNTER — Ambulatory Visit: Admitting: Family Medicine
